# Patient Record
Sex: Female | Born: 1935 | Race: Black or African American | Hispanic: No | Smoking: Former smoker
Health system: Southern US, Community
[De-identification: ages and names within clinical notes are randomized; demographics above are authoritative.]

## PROBLEM LIST (undated history)

## (undated) DIAGNOSIS — M1990 Unspecified osteoarthritis, unspecified site: Secondary | ICD-10-CM

## (undated) DIAGNOSIS — Z86711 Personal history of pulmonary embolism: Secondary | ICD-10-CM

## (undated) DIAGNOSIS — Z8679 Personal history of other diseases of the circulatory system: Secondary | ICD-10-CM

## (undated) DIAGNOSIS — E119 Type 2 diabetes mellitus without complications: Secondary | ICD-10-CM

## (undated) DIAGNOSIS — I739 Peripheral vascular disease, unspecified: Secondary | ICD-10-CM

## (undated) DIAGNOSIS — E669 Obesity, unspecified: Secondary | ICD-10-CM

## (undated) DIAGNOSIS — Z7901 Long term (current) use of anticoagulants: Secondary | ICD-10-CM

## (undated) DIAGNOSIS — I10 Essential (primary) hypertension: Secondary | ICD-10-CM

## (undated) DIAGNOSIS — R5381 Other malaise: Secondary | ICD-10-CM

## (undated) DIAGNOSIS — E039 Hypothyroidism, unspecified: Secondary | ICD-10-CM

## (undated) DIAGNOSIS — N3000 Acute cystitis without hematuria: Secondary | ICD-10-CM

## (undated) DIAGNOSIS — R5383 Other fatigue: Secondary | ICD-10-CM

## (undated) DIAGNOSIS — B369 Superficial mycosis, unspecified: Secondary | ICD-10-CM

## (undated) DIAGNOSIS — K219 Gastro-esophageal reflux disease without esophagitis: Secondary | ICD-10-CM

## (undated) DIAGNOSIS — E785 Hyperlipidemia, unspecified: Secondary | ICD-10-CM

## (undated) DIAGNOSIS — F0390 Unspecified dementia without behavioral disturbance: Secondary | ICD-10-CM

## (undated) HISTORY — DX: Long term (current) use of anticoagulants: Z79.01

## (undated) HISTORY — DX: Peripheral vascular disease, unspecified: I73.9

## (undated) HISTORY — DX: Unspecified osteoarthritis, unspecified site: M19.90

## (undated) HISTORY — DX: Hypothyroidism, unspecified: E03.9

## (undated) HISTORY — DX: Unspecified dementia, unspecified severity, without behavioral disturbance, psychotic disturbance, mood disturbance, and anxiety: F03.90

## (undated) HISTORY — DX: Personal history of pulmonary embolism: Z86.711

## (undated) HISTORY — DX: Other fatigue: R53.83

## (undated) HISTORY — DX: Other fatigue: R53.81

## (undated) HISTORY — DX: Essential (primary) hypertension: I10

## (undated) HISTORY — DX: Acute cystitis without hematuria: N30.00

## (undated) HISTORY — DX: Obesity, unspecified: E66.9

## (undated) HISTORY — DX: Superficial mycosis, unspecified: B36.9

## (undated) HISTORY — DX: Type 2 diabetes mellitus without complications: E11.9

## (undated) HISTORY — DX: Hyperlipidemia, unspecified: E78.5

## (undated) HISTORY — DX: Gastro-esophageal reflux disease without esophagitis: K21.9

## (undated) HISTORY — DX: Personal history of other diseases of the circulatory system: Z86.79

---

## 1960-03-28 HISTORY — PX: THYROIDECTOMY, PARTIAL: SHX18

## 2000-12-26 ENCOUNTER — Encounter: Payer: Self-pay | Admitting: *Deleted

## 2000-12-26 ENCOUNTER — Emergency Department (HOSPITAL_COMMUNITY): Admission: EM | Admit: 2000-12-26 | Discharge: 2000-12-26 | Payer: Self-pay | Admitting: *Deleted

## 2001-03-28 HISTORY — PX: MASTECTOMY: SHX3

## 2001-04-24 ENCOUNTER — Encounter: Payer: Self-pay | Admitting: *Deleted

## 2001-04-24 ENCOUNTER — Emergency Department (HOSPITAL_COMMUNITY): Admission: EM | Admit: 2001-04-24 | Discharge: 2001-04-24 | Payer: Self-pay | Admitting: *Deleted

## 2001-05-11 ENCOUNTER — Ambulatory Visit (HOSPITAL_COMMUNITY): Admission: RE | Admit: 2001-05-11 | Discharge: 2001-05-11 | Payer: Self-pay | Admitting: Neurology

## 2001-05-11 ENCOUNTER — Encounter: Payer: Self-pay | Admitting: Neurology

## 2001-09-27 ENCOUNTER — Encounter: Payer: Self-pay | Admitting: Family Medicine

## 2001-09-27 ENCOUNTER — Ambulatory Visit (HOSPITAL_COMMUNITY): Admission: RE | Admit: 2001-09-27 | Discharge: 2001-09-27 | Payer: Self-pay | Admitting: Family Medicine

## 2001-11-14 ENCOUNTER — Encounter: Payer: Self-pay | Admitting: Emergency Medicine

## 2001-11-15 ENCOUNTER — Inpatient Hospital Stay (HOSPITAL_COMMUNITY): Admission: EM | Admit: 2001-11-15 | Discharge: 2001-11-16 | Payer: Self-pay | Admitting: Emergency Medicine

## 2001-11-20 ENCOUNTER — Ambulatory Visit (HOSPITAL_COMMUNITY): Admission: RE | Admit: 2001-11-20 | Discharge: 2001-11-20 | Payer: Self-pay | Admitting: Family Medicine

## 2001-11-20 ENCOUNTER — Encounter: Payer: Self-pay | Admitting: Family Medicine

## 2001-11-22 ENCOUNTER — Encounter: Payer: Self-pay | Admitting: Family Medicine

## 2001-12-11 ENCOUNTER — Ambulatory Visit (HOSPITAL_COMMUNITY): Admission: RE | Admit: 2001-12-11 | Discharge: 2001-12-11 | Payer: Self-pay | Admitting: General Surgery

## 2001-12-11 ENCOUNTER — Encounter: Payer: Self-pay | Admitting: General Surgery

## 2001-12-14 ENCOUNTER — Inpatient Hospital Stay (HOSPITAL_COMMUNITY): Admission: RE | Admit: 2001-12-14 | Discharge: 2001-12-17 | Payer: Self-pay | Admitting: General Surgery

## 2001-12-22 ENCOUNTER — Emergency Department (HOSPITAL_COMMUNITY): Admission: EM | Admit: 2001-12-22 | Discharge: 2001-12-22 | Payer: Self-pay | Admitting: Emergency Medicine

## 2001-12-22 ENCOUNTER — Encounter: Payer: Self-pay | Admitting: Emergency Medicine

## 2002-02-05 ENCOUNTER — Encounter (HOSPITAL_COMMUNITY): Admission: RE | Admit: 2002-02-05 | Discharge: 2002-03-07 | Payer: Self-pay | Admitting: Oncology

## 2002-02-05 ENCOUNTER — Encounter: Admission: RE | Admit: 2002-02-05 | Discharge: 2002-02-05 | Payer: Self-pay | Admitting: Oncology

## 2002-10-15 ENCOUNTER — Encounter: Payer: Self-pay | Admitting: General Surgery

## 2002-10-15 ENCOUNTER — Ambulatory Visit (HOSPITAL_COMMUNITY): Admission: RE | Admit: 2002-10-15 | Discharge: 2002-10-15 | Payer: Self-pay | Admitting: General Surgery

## 2002-12-09 ENCOUNTER — Encounter: Payer: Self-pay | Admitting: Emergency Medicine

## 2002-12-09 ENCOUNTER — Emergency Department (HOSPITAL_COMMUNITY): Admission: EM | Admit: 2002-12-09 | Discharge: 2002-12-09 | Payer: Self-pay | Admitting: Emergency Medicine

## 2002-12-16 ENCOUNTER — Ambulatory Visit (HOSPITAL_COMMUNITY): Admission: RE | Admit: 2002-12-16 | Discharge: 2002-12-16 | Payer: Self-pay | Admitting: Diagnostic Radiology

## 2003-01-27 HISTORY — PX: ATRIAL ABLATION SURGERY: SHX560

## 2003-02-03 ENCOUNTER — Ambulatory Visit (HOSPITAL_COMMUNITY): Admission: RE | Admit: 2003-02-03 | Discharge: 2003-02-04 | Payer: Self-pay | Admitting: Internal Medicine

## 2003-02-17 ENCOUNTER — Encounter: Admission: RE | Admit: 2003-02-17 | Discharge: 2003-02-17 | Payer: Self-pay | Admitting: Oncology

## 2003-02-17 ENCOUNTER — Encounter (HOSPITAL_COMMUNITY): Admission: RE | Admit: 2003-02-17 | Discharge: 2003-03-19 | Payer: Self-pay | Admitting: Oncology

## 2003-03-03 ENCOUNTER — Encounter: Payer: Self-pay | Admitting: Cardiology

## 2003-05-01 ENCOUNTER — Ambulatory Visit (HOSPITAL_COMMUNITY): Admission: RE | Admit: 2003-05-01 | Discharge: 2003-05-01 | Payer: Self-pay | Admitting: General Surgery

## 2003-05-28 ENCOUNTER — Ambulatory Visit (HOSPITAL_COMMUNITY): Admission: RE | Admit: 2003-05-28 | Discharge: 2003-05-28 | Payer: Self-pay | Admitting: Family Medicine

## 2004-05-31 ENCOUNTER — Ambulatory Visit: Payer: Self-pay | Admitting: Family Medicine

## 2004-06-02 ENCOUNTER — Ambulatory Visit (HOSPITAL_COMMUNITY): Admission: RE | Admit: 2004-06-02 | Discharge: 2004-06-02 | Payer: Self-pay | Admitting: Family Medicine

## 2004-10-06 ENCOUNTER — Ambulatory Visit: Payer: Self-pay | Admitting: Family Medicine

## 2004-11-02 ENCOUNTER — Encounter: Admission: RE | Admit: 2004-11-02 | Discharge: 2004-11-02 | Payer: Self-pay | Admitting: Oncology

## 2004-11-02 ENCOUNTER — Ambulatory Visit (HOSPITAL_COMMUNITY): Payer: Self-pay | Admitting: Oncology

## 2005-02-07 ENCOUNTER — Ambulatory Visit: Payer: Self-pay | Admitting: Family Medicine

## 2005-02-08 ENCOUNTER — Inpatient Hospital Stay (HOSPITAL_COMMUNITY): Admission: EM | Admit: 2005-02-08 | Discharge: 2005-02-14 | Payer: Self-pay | Admitting: Emergency Medicine

## 2005-02-22 ENCOUNTER — Ambulatory Visit: Payer: Self-pay | Admitting: Family Medicine

## 2005-03-10 ENCOUNTER — Ambulatory Visit: Payer: Self-pay | Admitting: Family Medicine

## 2005-03-10 ENCOUNTER — Ambulatory Visit (HOSPITAL_COMMUNITY): Admission: RE | Admit: 2005-03-10 | Discharge: 2005-03-10 | Payer: Self-pay | Admitting: Family Medicine

## 2005-04-25 ENCOUNTER — Ambulatory Visit: Payer: Self-pay | Admitting: Family Medicine

## 2005-05-23 ENCOUNTER — Ambulatory Visit: Payer: Self-pay | Admitting: Family Medicine

## 2005-06-09 ENCOUNTER — Ambulatory Visit (HOSPITAL_COMMUNITY): Admission: RE | Admit: 2005-06-09 | Discharge: 2005-06-09 | Payer: Self-pay | Admitting: Family Medicine

## 2005-07-22 ENCOUNTER — Ambulatory Visit: Payer: Self-pay | Admitting: Cardiology

## 2005-08-19 ENCOUNTER — Ambulatory Visit: Payer: Self-pay | Admitting: *Deleted

## 2005-09-22 ENCOUNTER — Ambulatory Visit: Payer: Self-pay | Admitting: Family Medicine

## 2005-10-13 ENCOUNTER — Ambulatory Visit: Payer: Self-pay | Admitting: Family Medicine

## 2005-11-16 ENCOUNTER — Ambulatory Visit: Payer: Self-pay | Admitting: Cardiology

## 2005-11-21 ENCOUNTER — Ambulatory Visit (HOSPITAL_COMMUNITY): Payer: Self-pay | Admitting: Oncology

## 2005-11-21 ENCOUNTER — Encounter: Admission: RE | Admit: 2005-11-21 | Discharge: 2005-11-21 | Payer: Self-pay | Admitting: Oncology

## 2005-11-21 ENCOUNTER — Encounter (HOSPITAL_COMMUNITY): Admission: RE | Admit: 2005-11-21 | Discharge: 2005-12-21 | Payer: Self-pay | Admitting: Oncology

## 2005-12-29 ENCOUNTER — Ambulatory Visit: Payer: Self-pay | Admitting: Cardiology

## 2006-02-23 ENCOUNTER — Ambulatory Visit: Payer: Self-pay | Admitting: Cardiology

## 2006-03-22 ENCOUNTER — Ambulatory Visit: Payer: Self-pay | Admitting: Cardiology

## 2006-04-10 ENCOUNTER — Ambulatory Visit: Payer: Self-pay | Admitting: Family Medicine

## 2006-05-16 ENCOUNTER — Ambulatory Visit: Payer: Self-pay | Admitting: Cardiology

## 2006-05-23 ENCOUNTER — Ambulatory Visit: Payer: Self-pay | Admitting: Cardiovascular Disease

## 2006-06-22 ENCOUNTER — Encounter (INDEPENDENT_AMBULATORY_CARE_PROVIDER_SITE_OTHER): Payer: Self-pay | Admitting: Specialist

## 2006-06-22 ENCOUNTER — Ambulatory Visit: Payer: Self-pay | Admitting: Family Medicine

## 2006-06-22 ENCOUNTER — Other Ambulatory Visit: Admission: RE | Admit: 2006-06-22 | Discharge: 2006-06-22 | Payer: Self-pay | Admitting: Family Medicine

## 2006-10-17 ENCOUNTER — Ambulatory Visit: Payer: Self-pay | Admitting: Family Medicine

## 2006-10-23 ENCOUNTER — Emergency Department (HOSPITAL_COMMUNITY): Admission: EM | Admit: 2006-10-23 | Discharge: 2006-10-23 | Payer: Self-pay | Admitting: Emergency Medicine

## 2006-10-26 ENCOUNTER — Ambulatory Visit: Payer: Self-pay | Admitting: Family Medicine

## 2006-11-01 ENCOUNTER — Encounter: Payer: Self-pay | Admitting: Family Medicine

## 2006-11-01 LAB — CONVERTED CEMR LAB
ALT: 12 units/L (ref 0–35)
AST: 21 units/L (ref 0–37)
Albumin: 4.3 g/dL (ref 3.5–5.2)
Cholesterol: 192 mg/dL (ref 0–200)
HDL: 54 mg/dL (ref >39)
TSH: 1.806 microintl units/mL (ref 0.350–5.50)
Total CHOL/HDL Ratio: 3.6

## 2007-02-11 ENCOUNTER — Inpatient Hospital Stay (HOSPITAL_COMMUNITY): Admission: EM | Admit: 2007-02-11 | Discharge: 2007-02-13 | Payer: Self-pay | Admitting: Emergency Medicine

## 2007-02-14 ENCOUNTER — Ambulatory Visit: Payer: Self-pay | Admitting: Family Medicine

## 2007-02-21 ENCOUNTER — Ambulatory Visit: Payer: Self-pay | Admitting: Family Medicine

## 2007-03-14 ENCOUNTER — Ambulatory Visit: Payer: Self-pay | Admitting: Family Medicine

## 2007-03-15 ENCOUNTER — Encounter: Payer: Self-pay | Admitting: Family Medicine

## 2007-04-17 ENCOUNTER — Ambulatory Visit: Payer: Self-pay | Admitting: Family Medicine

## 2007-04-19 ENCOUNTER — Encounter: Payer: Self-pay | Admitting: Family Medicine

## 2007-04-24 ENCOUNTER — Ambulatory Visit (HOSPITAL_COMMUNITY): Admission: RE | Admit: 2007-04-24 | Discharge: 2007-04-24 | Payer: Self-pay | Admitting: Family Medicine

## 2007-05-04 ENCOUNTER — Encounter: Payer: Self-pay | Admitting: Family Medicine

## 2007-05-08 ENCOUNTER — Ambulatory Visit (HOSPITAL_COMMUNITY): Admission: RE | Admit: 2007-05-08 | Discharge: 2007-05-08 | Payer: Self-pay | Admitting: Family Medicine

## 2007-05-17 ENCOUNTER — Encounter (INDEPENDENT_AMBULATORY_CARE_PROVIDER_SITE_OTHER): Payer: Self-pay | Admitting: Obstetrics and Gynecology

## 2007-05-17 ENCOUNTER — Ambulatory Visit (HOSPITAL_COMMUNITY): Admission: RE | Admit: 2007-05-17 | Discharge: 2007-05-17 | Payer: Self-pay | Admitting: Obstetrics and Gynecology

## 2007-05-24 ENCOUNTER — Ambulatory Visit: Payer: Self-pay | Admitting: Family Medicine

## 2007-05-26 ENCOUNTER — Encounter: Payer: Self-pay | Admitting: Family Medicine

## 2007-09-21 ENCOUNTER — Encounter: Payer: Self-pay | Admitting: Family Medicine

## 2007-09-21 DIAGNOSIS — E785 Hyperlipidemia, unspecified: Secondary | ICD-10-CM

## 2007-09-21 DIAGNOSIS — M479 Spondylosis, unspecified: Secondary | ICD-10-CM | POA: Insufficient documentation

## 2007-09-21 DIAGNOSIS — E669 Obesity, unspecified: Secondary | ICD-10-CM

## 2007-09-21 DIAGNOSIS — I10 Essential (primary) hypertension: Secondary | ICD-10-CM

## 2007-09-21 DIAGNOSIS — K219 Gastro-esophageal reflux disease without esophagitis: Secondary | ICD-10-CM

## 2007-09-21 DIAGNOSIS — E039 Hypothyroidism, unspecified: Secondary | ICD-10-CM

## 2007-09-21 DIAGNOSIS — Z8679 Personal history of other diseases of the circulatory system: Secondary | ICD-10-CM

## 2007-09-21 DIAGNOSIS — Z86718 Personal history of other venous thrombosis and embolism: Secondary | ICD-10-CM

## 2007-09-21 DIAGNOSIS — F0390 Unspecified dementia without behavioral disturbance: Secondary | ICD-10-CM

## 2007-11-07 ENCOUNTER — Encounter: Payer: Self-pay | Admitting: Family Medicine

## 2007-11-21 ENCOUNTER — Ambulatory Visit: Payer: Self-pay | Admitting: Family Medicine

## 2007-11-21 DIAGNOSIS — R5381 Other malaise: Secondary | ICD-10-CM

## 2007-11-21 DIAGNOSIS — R5383 Other fatigue: Secondary | ICD-10-CM

## 2007-11-21 LAB — CONVERTED CEMR LAB
Nitrite: NEGATIVE
Protein, U semiquant: NEGATIVE
Specific Gravity, Urine: 1.025
Urobilinogen, UA: 0.2

## 2007-11-22 ENCOUNTER — Telehealth: Payer: Self-pay | Admitting: Family Medicine

## 2007-11-22 ENCOUNTER — Encounter: Payer: Self-pay | Admitting: Family Medicine

## 2007-11-24 DIAGNOSIS — N3000 Acute cystitis without hematuria: Secondary | ICD-10-CM

## 2007-11-24 DIAGNOSIS — B369 Superficial mycosis, unspecified: Secondary | ICD-10-CM | POA: Insufficient documentation

## 2007-11-25 ENCOUNTER — Encounter: Payer: Self-pay | Admitting: Family Medicine

## 2007-11-27 ENCOUNTER — Encounter: Payer: Self-pay | Admitting: Family Medicine

## 2007-11-27 LAB — CONVERTED CEMR LAB
ALT: 12 units/L (ref 0–35)
AST: 19 units/L (ref 0–37)
Alkaline Phosphatase: 73 units/L (ref 39–117)
Basophils Absolute: 0 10*3/uL (ref 0.0–0.1)
Bilirubin, Direct: 0.1 mg/dL (ref 0.0–0.3)
Calcium: 9.3 mg/dL (ref 8.4–10.5)
Cholesterol: 183 mg/dL (ref 0–200)
Eosinophils Absolute: 0.1 10*3/uL (ref 0.0–0.7)
Eosinophils Relative: 3 % (ref 0–5)
Glucose, Bld: 139 mg/dL — ABNORMAL HIGH (ref 70–99)
HCT: 43.8 % (ref 36.0–46.0)
Indirect Bilirubin: 0.4 mg/dL (ref 0.0–0.9)
Lymphs Abs: 1.7 10*3/uL (ref 0.7–4.0)
MCV: 80.4 fL (ref 78.0–100.0)
Platelets: 227 10*3/uL (ref 150–400)
RDW: 13.9 % (ref 11.5–15.5)
Sodium: 139 meq/L (ref 135–145)
TSH: 2.108 microintl units/mL (ref 0.350–4.50)
Total CHOL/HDL Ratio: 4

## 2007-12-11 ENCOUNTER — Ambulatory Visit: Payer: Self-pay | Admitting: Family Medicine

## 2007-12-11 DIAGNOSIS — E119 Type 2 diabetes mellitus without complications: Secondary | ICD-10-CM | POA: Insufficient documentation

## 2008-01-11 ENCOUNTER — Encounter: Payer: Self-pay | Admitting: Family Medicine

## 2008-01-20 ENCOUNTER — Observation Stay (HOSPITAL_COMMUNITY): Admission: EM | Admit: 2008-01-20 | Discharge: 2008-01-22 | Payer: Self-pay | Admitting: Emergency Medicine

## 2008-01-20 ENCOUNTER — Ambulatory Visit: Payer: Self-pay | Admitting: Cardiology

## 2008-01-21 ENCOUNTER — Encounter (INDEPENDENT_AMBULATORY_CARE_PROVIDER_SITE_OTHER): Payer: Self-pay | Admitting: Internal Medicine

## 2008-01-21 ENCOUNTER — Encounter: Payer: Self-pay | Admitting: Family Medicine

## 2008-01-31 ENCOUNTER — Ambulatory Visit: Payer: Self-pay | Admitting: Family Medicine

## 2008-02-07 ENCOUNTER — Ambulatory Visit: Payer: Self-pay | Admitting: Cardiology

## 2008-03-13 ENCOUNTER — Ambulatory Visit: Payer: Self-pay | Admitting: Family Medicine

## 2008-03-13 LAB — CONVERTED CEMR LAB
Glucose, Bld: 183 mg/dL
Hgb A1c MFr Bld: 6.2 %

## 2008-03-14 ENCOUNTER — Encounter: Payer: Self-pay | Admitting: Family Medicine

## 2008-03-14 LAB — CONVERTED CEMR LAB
Creatinine, Urine: 411.1 mg/dL
Microalb Creat Ratio: 15.9 mg/g (ref 0.0–30.0)
Microalb, Ur: 6.55 mg/dL — ABNORMAL HIGH (ref 0.00–1.89)

## 2008-05-09 ENCOUNTER — Ambulatory Visit (HOSPITAL_COMMUNITY): Admission: RE | Admit: 2008-05-09 | Discharge: 2008-05-09 | Payer: Self-pay | Admitting: Family Medicine

## 2008-05-13 ENCOUNTER — Telehealth: Payer: Self-pay | Admitting: Family Medicine

## 2008-06-19 ENCOUNTER — Ambulatory Visit: Payer: Self-pay | Admitting: Family Medicine

## 2008-06-19 LAB — CONVERTED CEMR LAB
Nitrite: POSITIVE
Protein, U semiquant: 30
Urobilinogen, UA: 0.2

## 2008-06-20 ENCOUNTER — Encounter: Payer: Self-pay | Admitting: Family Medicine

## 2008-09-08 DIAGNOSIS — I739 Peripheral vascular disease, unspecified: Secondary | ICD-10-CM

## 2008-10-02 ENCOUNTER — Ambulatory Visit: Payer: Self-pay | Admitting: Family Medicine

## 2008-10-02 DIAGNOSIS — J209 Acute bronchitis, unspecified: Secondary | ICD-10-CM | POA: Insufficient documentation

## 2008-10-02 LAB — CONVERTED CEMR LAB
Basophils Relative: 1 % (ref 0–1)
Bilirubin Urine: NEGATIVE
Blood in Urine, dipstick: NEGATIVE
CO2: 25 meq/L (ref 19–32)
Calcium: 10 mg/dL (ref 8.4–10.5)
Chloride: 100 meq/L (ref 96–112)
Eosinophils Absolute: 0.1 10*3/uL (ref 0.0–0.7)
Glucose, Bld: 108 mg/dL — ABNORMAL HIGH (ref 70–99)
Hemoglobin: 13.4 g/dL (ref 12.0–15.0)
Ketones, urine, test strip: NEGATIVE
Lymphs Abs: 2.8 10*3/uL (ref 0.7–4.0)
MCHC: 31.9 g/dL (ref 30.0–36.0)
MCV: 78.5 fL (ref 78.0–100.0)
Monocytes Absolute: 0.5 10*3/uL (ref 0.1–1.0)
Monocytes Relative: 11 % (ref 3–12)
Neutro Abs: 1.2 10*3/uL — ABNORMAL LOW (ref 1.7–7.7)
Nitrite: NEGATIVE
RBC: 5.35 M/uL — ABNORMAL HIGH (ref 3.87–5.11)
Sodium: 141 meq/L (ref 135–145)
Urobilinogen, UA: 0.2
WBC: 4.5 10*3/uL (ref 4.0–10.5)

## 2008-10-13 ENCOUNTER — Telehealth: Payer: Self-pay | Admitting: Family Medicine

## 2008-10-13 ENCOUNTER — Emergency Department (HOSPITAL_COMMUNITY): Admission: EM | Admit: 2008-10-13 | Discharge: 2008-10-13 | Payer: Self-pay | Admitting: Emergency Medicine

## 2008-10-15 ENCOUNTER — Telehealth: Payer: Self-pay | Admitting: Family Medicine

## 2008-10-17 ENCOUNTER — Inpatient Hospital Stay (HOSPITAL_COMMUNITY): Admission: AD | Admit: 2008-10-17 | Discharge: 2008-10-20 | Payer: Self-pay

## 2008-10-17 ENCOUNTER — Ambulatory Visit: Payer: Self-pay | Admitting: Family Medicine

## 2008-10-17 DIAGNOSIS — R4182 Altered mental status, unspecified: Secondary | ICD-10-CM

## 2008-10-17 LAB — CONVERTED CEMR LAB
Bilirubin Urine: NEGATIVE
Blood in Urine, dipstick: NEGATIVE
Ketones, urine, test strip: NEGATIVE
Nitrite: NEGATIVE
Urobilinogen, UA: 2

## 2008-10-18 ENCOUNTER — Encounter: Payer: Self-pay | Admitting: Family Medicine

## 2008-10-22 ENCOUNTER — Encounter: Payer: Self-pay | Admitting: Family Medicine

## 2008-10-23 ENCOUNTER — Ambulatory Visit: Payer: Self-pay | Admitting: Cardiology

## 2008-10-23 ENCOUNTER — Inpatient Hospital Stay (HOSPITAL_COMMUNITY): Admission: EM | Admit: 2008-10-23 | Discharge: 2008-10-28 | Payer: Self-pay | Admitting: Emergency Medicine

## 2008-10-27 ENCOUNTER — Telehealth: Payer: Self-pay | Admitting: Family Medicine

## 2008-10-27 ENCOUNTER — Encounter: Payer: Self-pay | Admitting: Cardiology

## 2008-10-27 ENCOUNTER — Encounter (INDEPENDENT_AMBULATORY_CARE_PROVIDER_SITE_OTHER): Payer: Self-pay | Admitting: Family Medicine

## 2008-10-28 ENCOUNTER — Inpatient Hospital Stay
Admission: AD | Admit: 2008-10-28 | Discharge: 2011-03-26 | Disposition: A | Discharge status: Nursing Home (Includes ICF) | Payer: Medicare Other | Source: Home / Self Care | Attending: Internal Medicine | Admitting: Internal Medicine

## 2008-10-28 DIAGNOSIS — R05 Cough: Secondary | ICD-10-CM

## 2008-10-28 DIAGNOSIS — R4189 Other symptoms and signs involving cognitive functions and awareness: Secondary | ICD-10-CM

## 2008-10-31 ENCOUNTER — Ambulatory Visit: Payer: Self-pay | Admitting: Family Medicine

## 2008-11-10 ENCOUNTER — Encounter: Payer: Self-pay | Admitting: *Deleted

## 2008-11-18 ENCOUNTER — Telehealth: Payer: Self-pay | Admitting: Family Medicine

## 2008-12-17 ENCOUNTER — Encounter: Payer: Self-pay | Admitting: Cardiology

## 2009-04-23 ENCOUNTER — Ambulatory Visit (HOSPITAL_COMMUNITY)
Admission: RE | Admit: 2009-04-23 | Discharge: 2009-04-23 | Payer: Self-pay | Source: Home / Self Care | Admitting: Internal Medicine

## 2009-08-13 ENCOUNTER — Ambulatory Visit (HOSPITAL_COMMUNITY): Admission: RE | Admit: 2009-08-13 | Discharge: 2009-08-13 | Payer: Self-pay | Admitting: Internal Medicine

## 2009-09-10 ENCOUNTER — Ambulatory Visit (HOSPITAL_COMMUNITY): Admission: RE | Admit: 2009-09-10 | Discharge: 2009-09-10 | Payer: Self-pay | Admitting: Internal Medicine

## 2009-10-08 ENCOUNTER — Ambulatory Visit (HOSPITAL_COMMUNITY): Admission: RE | Admit: 2009-10-08 | Discharge: 2009-10-08 | Payer: Self-pay | Admitting: Internal Medicine

## 2009-10-29 ENCOUNTER — Ambulatory Visit (HOSPITAL_COMMUNITY): Admission: RE | Admit: 2009-10-29 | Discharge: 2009-10-29 | Payer: Self-pay | Admitting: Internal Medicine

## 2010-04-02 LAB — GLUCOSE, CAPILLARY: Glucose-Capillary: 125 mg/dL — ABNORMAL HIGH (ref 70–99)

## 2010-04-12 LAB — GLUCOSE, CAPILLARY
Glucose-Capillary: 114 mg/dL — ABNORMAL HIGH (ref 70–99)
Glucose-Capillary: 130 mg/dL — ABNORMAL HIGH (ref 70–99)
Glucose-Capillary: 149 mg/dL — ABNORMAL HIGH (ref 70–99)
Glucose-Capillary: 150 mg/dL — ABNORMAL HIGH (ref 70–99)
Glucose-Capillary: 147 mg/dL — ABNORMAL HIGH (ref 70–99)
Glucose-Capillary: 122 mg/dL — ABNORMAL HIGH (ref 70–99)

## 2010-04-14 LAB — GLUCOSE, CAPILLARY: Glucose-Capillary: 190 mg/dL — ABNORMAL HIGH (ref 70–99)

## 2010-04-17 ENCOUNTER — Encounter: Payer: Self-pay | Admitting: Family Medicine

## 2010-04-18 ENCOUNTER — Encounter: Payer: Self-pay | Admitting: Family Medicine

## 2010-04-19 LAB — GLUCOSE, CAPILLARY: Glucose-Capillary: 188 mg/dL — ABNORMAL HIGH (ref 70–99)

## 2010-04-23 LAB — GLUCOSE, CAPILLARY

## 2010-04-25 LAB — GLUCOSE, CAPILLARY: Glucose-Capillary: 131 mg/dL — ABNORMAL HIGH (ref 70–99)

## 2010-04-30 LAB — GLUCOSE, CAPILLARY: Glucose-Capillary: 123 mg/dL — ABNORMAL HIGH (ref 70–99)

## 2010-05-03 LAB — GLUCOSE, CAPILLARY
Glucose-Capillary: 201 mg/dL — ABNORMAL HIGH (ref 70–99)
Glucose-Capillary: 164 mg/dL — ABNORMAL HIGH (ref 70–99)

## 2010-05-07 LAB — GLUCOSE, CAPILLARY: Glucose-Capillary: 136 mg/dL — ABNORMAL HIGH (ref 70–99)

## 2010-05-14 LAB — GLUCOSE, CAPILLARY: Glucose-Capillary: 119 mg/dL — ABNORMAL HIGH (ref 70–99)

## 2010-05-21 LAB — GLUCOSE, CAPILLARY: Glucose-Capillary: 180 mg/dL — ABNORMAL HIGH (ref 70–99)

## 2010-05-24 LAB — GLUCOSE, CAPILLARY: Glucose-Capillary: 133 mg/dL — ABNORMAL HIGH (ref 70–99)

## 2010-05-28 LAB — GLUCOSE, CAPILLARY: Glucose-Capillary: 126 mg/dL — ABNORMAL HIGH (ref 70–99)

## 2010-06-07 LAB — GLUCOSE, CAPILLARY
Glucose-Capillary: 124 mg/dL — ABNORMAL HIGH (ref 70–99)
Glucose-Capillary: 129 mg/dL — ABNORMAL HIGH (ref 70–99)
Glucose-Capillary: 124 mg/dL — ABNORMAL HIGH (ref 70–99)
Glucose-Capillary: 125 mg/dL — ABNORMAL HIGH (ref 70–99)
Glucose-Capillary: 126 mg/dL — ABNORMAL HIGH (ref 70–99)
Glucose-Capillary: 133 mg/dL — ABNORMAL HIGH (ref 70–99)
Glucose-Capillary: 140 mg/dL — ABNORMAL HIGH (ref 70–99)
Glucose-Capillary: 166 mg/dL — ABNORMAL HIGH (ref 70–99)
Glucose-Capillary: 123 mg/dL — ABNORMAL HIGH (ref 70–99)
Glucose-Capillary: 122 mg/dL — ABNORMAL HIGH (ref 70–99)
Glucose-Capillary: 130 mg/dL — ABNORMAL HIGH (ref 70–99)
Glucose-Capillary: 139 mg/dL — ABNORMAL HIGH (ref 70–99)
Glucose-Capillary: 153 mg/dL — ABNORMAL HIGH (ref 70–99)
Glucose-Capillary: 175 mg/dL — ABNORMAL HIGH (ref 70–99)

## 2010-06-08 LAB — GLUCOSE, CAPILLARY
Glucose-Capillary: 105 mg/dL — ABNORMAL HIGH (ref 70–99)
Glucose-Capillary: 121 mg/dL — ABNORMAL HIGH (ref 70–99)
Glucose-Capillary: 122 mg/dL — ABNORMAL HIGH (ref 70–99)
Glucose-Capillary: 129 mg/dL — ABNORMAL HIGH (ref 70–99)
Glucose-Capillary: 129 mg/dL — ABNORMAL HIGH (ref 70–99)
Glucose-Capillary: 129 mg/dL — ABNORMAL HIGH (ref 70–99)
Glucose-Capillary: 130 mg/dL — ABNORMAL HIGH (ref 70–99)
Glucose-Capillary: 131 mg/dL — ABNORMAL HIGH (ref 70–99)
Glucose-Capillary: 135 mg/dL — ABNORMAL HIGH (ref 70–99)
Glucose-Capillary: 139 mg/dL — ABNORMAL HIGH (ref 70–99)
Glucose-Capillary: 141 mg/dL — ABNORMAL HIGH (ref 70–99)
Glucose-Capillary: 172 mg/dL — ABNORMAL HIGH (ref 70–99)
Glucose-Capillary: 209 mg/dL — ABNORMAL HIGH (ref 70–99)

## 2010-06-09 LAB — GLUCOSE, CAPILLARY
Glucose-Capillary: 104 mg/dL — ABNORMAL HIGH (ref 70–99)
Glucose-Capillary: 118 mg/dL — ABNORMAL HIGH (ref 70–99)
Glucose-Capillary: 121 mg/dL — ABNORMAL HIGH (ref 70–99)
Glucose-Capillary: 121 mg/dL — ABNORMAL HIGH (ref 70–99)
Glucose-Capillary: 143 mg/dL — ABNORMAL HIGH (ref 70–99)
Glucose-Capillary: 102 mg/dL — ABNORMAL HIGH (ref 70–99)
Glucose-Capillary: 123 mg/dL — ABNORMAL HIGH (ref 70–99)
Glucose-Capillary: 126 mg/dL — ABNORMAL HIGH (ref 70–99)
Glucose-Capillary: 56 mg/dL — ABNORMAL LOW (ref 70–99)
Glucose-Capillary: 97 mg/dL (ref 70–99)

## 2010-06-10 LAB — GLUCOSE, CAPILLARY
Glucose-Capillary: 116 mg/dL — ABNORMAL HIGH (ref 70–99)
Glucose-Capillary: 118 mg/dL — ABNORMAL HIGH (ref 70–99)
Glucose-Capillary: 119 mg/dL — ABNORMAL HIGH (ref 70–99)
Glucose-Capillary: 127 mg/dL — ABNORMAL HIGH (ref 70–99)
Glucose-Capillary: 128 mg/dL — ABNORMAL HIGH (ref 70–99)
Glucose-Capillary: 150 mg/dL — ABNORMAL HIGH (ref 70–99)
Glucose-Capillary: 276 mg/dL — ABNORMAL HIGH (ref 70–99)
Glucose-Capillary: 31 mg/dL — CL (ref 70–99)
Glucose-Capillary: 36 mg/dL — CL (ref 70–99)
Glucose-Capillary: 108 mg/dL — ABNORMAL HIGH (ref 70–99)
Glucose-Capillary: 130 mg/dL — ABNORMAL HIGH (ref 70–99)
Glucose-Capillary: 153 mg/dL — ABNORMAL HIGH (ref 70–99)
Glucose-Capillary: 117 mg/dL — ABNORMAL HIGH (ref 70–99)
Glucose-Capillary: 117 mg/dL — ABNORMAL HIGH (ref 70–99)
Glucose-Capillary: 125 mg/dL — ABNORMAL HIGH (ref 70–99)
Glucose-Capillary: 128 mg/dL — ABNORMAL HIGH (ref 70–99)
Glucose-Capillary: 145 mg/dL — ABNORMAL HIGH (ref 70–99)
Glucose-Capillary: 168 mg/dL — ABNORMAL HIGH (ref 70–99)

## 2010-06-11 LAB — GLUCOSE, CAPILLARY
Glucose-Capillary: 122 mg/dL — ABNORMAL HIGH (ref 70–99)
Glucose-Capillary: 115 mg/dL — ABNORMAL HIGH (ref 70–99)
Glucose-Capillary: 118 mg/dL — ABNORMAL HIGH (ref 70–99)
Glucose-Capillary: 126 mg/dL — ABNORMAL HIGH (ref 70–99)
Glucose-Capillary: 142 mg/dL — ABNORMAL HIGH (ref 70–99)
Glucose-Capillary: 146 mg/dL — ABNORMAL HIGH (ref 70–99)

## 2010-06-12 LAB — GLUCOSE, CAPILLARY
Glucose-Capillary: 124 mg/dL — ABNORMAL HIGH (ref 70–99)
Glucose-Capillary: 158 mg/dL — ABNORMAL HIGH (ref 70–99)

## 2010-06-13 LAB — GLUCOSE, CAPILLARY
Glucose-Capillary: 125 mg/dL — ABNORMAL HIGH (ref 70–99)
Glucose-Capillary: 145 mg/dL — ABNORMAL HIGH (ref 70–99)
Glucose-Capillary: 147 mg/dL — ABNORMAL HIGH (ref 70–99)
Glucose-Capillary: 151 mg/dL — ABNORMAL HIGH (ref 70–99)
Glucose-Capillary: 115 mg/dL — ABNORMAL HIGH (ref 70–99)
Glucose-Capillary: 124 mg/dL — ABNORMAL HIGH (ref 70–99)
Glucose-Capillary: 125 mg/dL — ABNORMAL HIGH (ref 70–99)
Glucose-Capillary: 166 mg/dL — ABNORMAL HIGH (ref 70–99)
Glucose-Capillary: 107 mg/dL — ABNORMAL HIGH (ref 70–99)
Glucose-Capillary: 115 mg/dL — ABNORMAL HIGH (ref 70–99)
Glucose-Capillary: 121 mg/dL — ABNORMAL HIGH (ref 70–99)
Glucose-Capillary: 158 mg/dL — ABNORMAL HIGH (ref 70–99)
Glucose-Capillary: 185 mg/dL — ABNORMAL HIGH (ref 70–99)
Glucose-Capillary: 120 mg/dL — ABNORMAL HIGH (ref 70–99)
Glucose-Capillary: 126 mg/dL — ABNORMAL HIGH (ref 70–99)
Glucose-Capillary: 136 mg/dL — ABNORMAL HIGH (ref 70–99)

## 2010-06-14 LAB — GLUCOSE, CAPILLARY
Glucose-Capillary: 134 mg/dL — ABNORMAL HIGH (ref 70–99)
Glucose-Capillary: 118 mg/dL — ABNORMAL HIGH (ref 70–99)
Glucose-Capillary: 121 mg/dL — ABNORMAL HIGH (ref 70–99)
Glucose-Capillary: 131 mg/dL — ABNORMAL HIGH (ref 70–99)
Glucose-Capillary: 133 mg/dL — ABNORMAL HIGH (ref 70–99)
Glucose-Capillary: 135 mg/dL — ABNORMAL HIGH (ref 70–99)
Glucose-Capillary: 117 mg/dL — ABNORMAL HIGH (ref 70–99)
Glucose-Capillary: 134 mg/dL — ABNORMAL HIGH (ref 70–99)
Glucose-Capillary: 148 mg/dL — ABNORMAL HIGH (ref 70–99)
Glucose-Capillary: 188 mg/dL — ABNORMAL HIGH (ref 70–99)

## 2010-06-15 LAB — GLUCOSE, CAPILLARY
Glucose-Capillary: 123 mg/dL — ABNORMAL HIGH (ref 70–99)
Glucose-Capillary: 128 mg/dL — ABNORMAL HIGH (ref 70–99)
Glucose-Capillary: 212 mg/dL — ABNORMAL HIGH (ref 70–99)
Glucose-Capillary: 110 mg/dL — ABNORMAL HIGH (ref 70–99)
Glucose-Capillary: 117 mg/dL — ABNORMAL HIGH (ref 70–99)
Glucose-Capillary: 127 mg/dL — ABNORMAL HIGH (ref 70–99)
Glucose-Capillary: 155 mg/dL — ABNORMAL HIGH (ref 70–99)

## 2010-06-16 LAB — GLUCOSE, CAPILLARY
Glucose-Capillary: 107 mg/dL — ABNORMAL HIGH (ref 70–99)
Glucose-Capillary: 118 mg/dL — ABNORMAL HIGH (ref 70–99)
Glucose-Capillary: 119 mg/dL — ABNORMAL HIGH (ref 70–99)
Glucose-Capillary: 120 mg/dL — ABNORMAL HIGH (ref 70–99)
Glucose-Capillary: 127 mg/dL — ABNORMAL HIGH (ref 70–99)
Glucose-Capillary: 129 mg/dL — ABNORMAL HIGH (ref 70–99)
Glucose-Capillary: 135 mg/dL — ABNORMAL HIGH (ref 70–99)
Glucose-Capillary: 136 mg/dL — ABNORMAL HIGH (ref 70–99)
Glucose-Capillary: 144 mg/dL — ABNORMAL HIGH (ref 70–99)
Glucose-Capillary: 145 mg/dL — ABNORMAL HIGH (ref 70–99)
Glucose-Capillary: 41 mg/dL — CL (ref 70–99)
Glucose-Capillary: 110 mg/dL — ABNORMAL HIGH (ref 70–99)

## 2010-06-17 ENCOUNTER — Ambulatory Visit (HOSPITAL_COMMUNITY): Payer: Medicare Other | Attending: Internal Medicine

## 2010-06-17 ENCOUNTER — Inpatient Hospital Stay (HOSPITAL_COMMUNITY): Payer: Medicare Other

## 2010-06-17 DIAGNOSIS — M79609 Pain in unspecified limb: Secondary | ICD-10-CM | POA: Insufficient documentation

## 2010-06-17 DIAGNOSIS — M949 Disorder of cartilage, unspecified: Secondary | ICD-10-CM | POA: Insufficient documentation

## 2010-06-17 DIAGNOSIS — M25559 Pain in unspecified hip: Secondary | ICD-10-CM | POA: Insufficient documentation

## 2010-06-17 DIAGNOSIS — M899 Disorder of bone, unspecified: Secondary | ICD-10-CM | POA: Insufficient documentation

## 2010-06-17 DIAGNOSIS — M7120 Synovial cyst of popliteal space [Baker], unspecified knee: Secondary | ICD-10-CM | POA: Insufficient documentation

## 2010-06-17 DIAGNOSIS — M1710 Unilateral primary osteoarthritis, unspecified knee: Secondary | ICD-10-CM | POA: Insufficient documentation

## 2010-06-20 LAB — GLUCOSE, CAPILLARY
Glucose-Capillary: 111 mg/dL — ABNORMAL HIGH (ref 70–99)
Glucose-Capillary: 125 mg/dL — ABNORMAL HIGH (ref 70–99)
Glucose-Capillary: 129 mg/dL — ABNORMAL HIGH (ref 70–99)
Glucose-Capillary: 141 mg/dL — ABNORMAL HIGH (ref 70–99)
Glucose-Capillary: 174 mg/dL — ABNORMAL HIGH (ref 70–99)
Glucose-Capillary: 98 mg/dL (ref 70–99)

## 2010-06-21 LAB — GLUCOSE, CAPILLARY: Glucose-Capillary: 128 mg/dL — ABNORMAL HIGH (ref 70–99)

## 2010-06-24 ENCOUNTER — Ambulatory Visit (HOSPITAL_COMMUNITY): Payer: Medicare Other | Attending: Internal Medicine

## 2010-06-24 DIAGNOSIS — J449 Chronic obstructive pulmonary disease, unspecified: Secondary | ICD-10-CM | POA: Insufficient documentation

## 2010-06-24 DIAGNOSIS — R05 Cough: Secondary | ICD-10-CM | POA: Insufficient documentation

## 2010-06-24 DIAGNOSIS — R059 Cough, unspecified: Secondary | ICD-10-CM | POA: Insufficient documentation

## 2010-06-24 DIAGNOSIS — J4489 Other specified chronic obstructive pulmonary disease: Secondary | ICD-10-CM | POA: Insufficient documentation

## 2010-06-25 LAB — GLUCOSE, CAPILLARY: Glucose-Capillary: 133 mg/dL — ABNORMAL HIGH (ref 70–99)

## 2010-06-28 LAB — GLUCOSE, CAPILLARY
Glucose-Capillary: 104 mg/dL — ABNORMAL HIGH (ref 70–99)
Glucose-Capillary: 107 mg/dL — ABNORMAL HIGH (ref 70–99)
Glucose-Capillary: 138 mg/dL — ABNORMAL HIGH (ref 70–99)
Glucose-Capillary: 150 mg/dL — ABNORMAL HIGH (ref 70–99)
Glucose-Capillary: 211 mg/dL — ABNORMAL HIGH (ref 70–99)
Glucose-Capillary: 96 mg/dL (ref 70–99)

## 2010-06-29 LAB — GLUCOSE, CAPILLARY
Glucose-Capillary: 112 mg/dL — ABNORMAL HIGH (ref 70–99)
Glucose-Capillary: 106 mg/dL — ABNORMAL HIGH (ref 70–99)
Glucose-Capillary: 122 mg/dL — ABNORMAL HIGH (ref 70–99)
Glucose-Capillary: 124 mg/dL — ABNORMAL HIGH (ref 70–99)
Glucose-Capillary: 146 mg/dL — ABNORMAL HIGH (ref 70–99)

## 2010-06-30 LAB — GLUCOSE, CAPILLARY
Glucose-Capillary: 115 mg/dL — ABNORMAL HIGH (ref 70–99)
Glucose-Capillary: 120 mg/dL — ABNORMAL HIGH (ref 70–99)
Glucose-Capillary: 130 mg/dL — ABNORMAL HIGH (ref 70–99)
Glucose-Capillary: 93 mg/dL (ref 70–99)
Glucose-Capillary: 97 mg/dL (ref 70–99)
Glucose-Capillary: 106 mg/dL — ABNORMAL HIGH (ref 70–99)
Glucose-Capillary: 109 mg/dL — ABNORMAL HIGH (ref 70–99)
Glucose-Capillary: 112 mg/dL — ABNORMAL HIGH (ref 70–99)
Glucose-Capillary: 113 mg/dL — ABNORMAL HIGH (ref 70–99)
Glucose-Capillary: 124 mg/dL — ABNORMAL HIGH (ref 70–99)
Glucose-Capillary: 98 mg/dL (ref 70–99)

## 2010-07-01 LAB — GLUCOSE, CAPILLARY
Glucose-Capillary: 108 mg/dL — ABNORMAL HIGH (ref 70–99)
Glucose-Capillary: 133 mg/dL — ABNORMAL HIGH (ref 70–99)
Glucose-Capillary: 187 mg/dL — ABNORMAL HIGH (ref 70–99)
Glucose-Capillary: 94 mg/dL (ref 70–99)
Glucose-Capillary: 102 mg/dL — ABNORMAL HIGH (ref 70–99)
Glucose-Capillary: 116 mg/dL — ABNORMAL HIGH (ref 70–99)
Glucose-Capillary: 121 mg/dL — ABNORMAL HIGH (ref 70–99)
Glucose-Capillary: 142 mg/dL — ABNORMAL HIGH (ref 70–99)
Glucose-Capillary: 143 mg/dL — ABNORMAL HIGH (ref 70–99)

## 2010-07-02 LAB — GLUCOSE, CAPILLARY
Glucose-Capillary: 123 mg/dL — ABNORMAL HIGH (ref 70–99)
Glucose-Capillary: 129 mg/dL — ABNORMAL HIGH (ref 70–99)
Glucose-Capillary: 97 mg/dL (ref 70–99)
Glucose-Capillary: 107 mg/dL — ABNORMAL HIGH (ref 70–99)
Glucose-Capillary: 114 mg/dL — ABNORMAL HIGH (ref 70–99)
Glucose-Capillary: 144 mg/dL — ABNORMAL HIGH (ref 70–99)
Glucose-Capillary: 144 mg/dL — ABNORMAL HIGH (ref 70–99)

## 2010-07-03 LAB — PROTIME-INR
INR: 2 — ABNORMAL HIGH (ref 0.00–1.49)
Prothrombin Time: 21.8 seconds — ABNORMAL HIGH (ref 11.6–15.2)

## 2010-07-03 LAB — DIFFERENTIAL
Basophils Absolute: 0 10*3/uL (ref 0.0–0.1)
Basophils Relative: 1 % (ref 0–1)
Eosinophils Absolute: 0.1 10*3/uL (ref 0.0–0.7)
Eosinophils Absolute: 0.1 10*3/uL (ref 0.0–0.7)
Eosinophils Relative: 3 % (ref 0–5)
Lymphs Abs: 1.8 10*3/uL (ref 0.7–4.0)
Monocytes Absolute: 0.6 10*3/uL (ref 0.1–1.0)
Monocytes Relative: 12 % (ref 3–12)
Monocytes Relative: 18 % — ABNORMAL HIGH (ref 3–12)
Neutrophils Relative %: 38 % — ABNORMAL LOW (ref 43–77)

## 2010-07-03 LAB — GLUCOSE, CAPILLARY
Glucose-Capillary: 112 mg/dL — ABNORMAL HIGH (ref 70–99)
Glucose-Capillary: 113 mg/dL — ABNORMAL HIGH (ref 70–99)
Glucose-Capillary: 116 mg/dL — ABNORMAL HIGH (ref 70–99)
Glucose-Capillary: 126 mg/dL — ABNORMAL HIGH (ref 70–99)
Glucose-Capillary: 133 mg/dL — ABNORMAL HIGH (ref 70–99)
Glucose-Capillary: 141 mg/dL — ABNORMAL HIGH (ref 70–99)
Glucose-Capillary: 150 mg/dL — ABNORMAL HIGH (ref 70–99)
Glucose-Capillary: 155 mg/dL — ABNORMAL HIGH (ref 70–99)
Glucose-Capillary: 160 mg/dL — ABNORMAL HIGH (ref 70–99)
Glucose-Capillary: 165 mg/dL — ABNORMAL HIGH (ref 70–99)
Glucose-Capillary: 92 mg/dL (ref 70–99)
Glucose-Capillary: 97 mg/dL (ref 70–99)
Glucose-Capillary: 106 mg/dL — ABNORMAL HIGH (ref 70–99)
Glucose-Capillary: 112 mg/dL — ABNORMAL HIGH (ref 70–99)
Glucose-Capillary: 114 mg/dL — ABNORMAL HIGH (ref 70–99)
Glucose-Capillary: 117 mg/dL — ABNORMAL HIGH (ref 70–99)
Glucose-Capillary: 119 mg/dL — ABNORMAL HIGH (ref 70–99)
Glucose-Capillary: 122 mg/dL — ABNORMAL HIGH (ref 70–99)
Glucose-Capillary: 126 mg/dL — ABNORMAL HIGH (ref 70–99)
Glucose-Capillary: 129 mg/dL — ABNORMAL HIGH (ref 70–99)
Glucose-Capillary: 134 mg/dL — ABNORMAL HIGH (ref 70–99)
Glucose-Capillary: 134 mg/dL — ABNORMAL HIGH (ref 70–99)
Glucose-Capillary: 140 mg/dL — ABNORMAL HIGH (ref 70–99)
Glucose-Capillary: 143 mg/dL — ABNORMAL HIGH (ref 70–99)
Glucose-Capillary: 143 mg/dL — ABNORMAL HIGH (ref 70–99)
Glucose-Capillary: 150 mg/dL — ABNORMAL HIGH (ref 70–99)
Glucose-Capillary: 156 mg/dL — ABNORMAL HIGH (ref 70–99)
Glucose-Capillary: 165 mg/dL — ABNORMAL HIGH (ref 70–99)
Glucose-Capillary: 165 mg/dL — ABNORMAL HIGH (ref 70–99)
Glucose-Capillary: 86 mg/dL (ref 70–99)
Glucose-Capillary: 91 mg/dL (ref 70–99)
Glucose-Capillary: 94 mg/dL (ref 70–99)
Glucose-Capillary: 96 mg/dL (ref 70–99)

## 2010-07-03 LAB — CBC
HCT: 29.8 % — ABNORMAL LOW (ref 36.0–46.0)
Hemoglobin: 10.1 g/dL — ABNORMAL LOW (ref 12.0–15.0)
Hemoglobin: 10.1 g/dL — ABNORMAL LOW (ref 12.0–15.0)
MCHC: 33.9 g/dL (ref 30.0–36.0)
MCV: 78.9 fL (ref 78.0–100.0)
MCV: 80.5 fL (ref 78.0–100.0)
RBC: 3.7 MIL/uL — ABNORMAL LOW (ref 3.87–5.11)
RBC: 3.79 MIL/uL — ABNORMAL LOW (ref 3.87–5.11)
RDW: 14.2 % (ref 11.5–15.5)
WBC: 3.8 10*3/uL — ABNORMAL LOW (ref 4.0–10.5)

## 2010-07-03 LAB — BASIC METABOLIC PANEL
CO2: 24 mEq/L (ref 19–32)
Calcium: 8.1 mg/dL — ABNORMAL LOW (ref 8.4–10.5)
Chloride: 109 mEq/L (ref 96–112)
Chloride: 110 mEq/L (ref 96–112)
Creatinine, Ser: 0.83 mg/dL (ref 0.4–1.2)
GFR calc Af Amer: >60 mL/min (ref >60)
Glucose, Bld: 110 mg/dL — ABNORMAL HIGH (ref 70–99)
Potassium: 3.8 mEq/L (ref 3.5–5.1)
Potassium: 4.2 mEq/L (ref 3.5–5.1)
Sodium: 138 mEq/L (ref 135–145)
Sodium: 139 mEq/L (ref 135–145)

## 2010-07-04 LAB — DIFFERENTIAL
Basophils Absolute: 0 10*3/uL (ref 0.0–0.1)
Basophils Absolute: 0 10*3/uL (ref 0.0–0.1)
Basophils Absolute: 0.2 10*3/uL — ABNORMAL HIGH (ref 0.0–0.1)
Basophils Relative: 1 % (ref 0–1)
Basophils Relative: 6 % — ABNORMAL HIGH (ref 0–1)
Eosinophils Absolute: 0 10*3/uL (ref 0.0–0.7)
Eosinophils Absolute: 0 10*3/uL (ref 0.0–0.7)
Eosinophils Absolute: 0.1 10*3/uL (ref 0.0–0.7)
Eosinophils Absolute: 0.2 10*3/uL (ref 0.0–0.7)
Eosinophils Relative: 0 % (ref 0–5)
Eosinophils Relative: 1 % (ref 0–5)
Eosinophils Relative: 2 % (ref 0–5)
Eosinophils Relative: 4 % (ref 0–5)
Lymphocytes Relative: 19 % (ref 12–46)
Lymphocytes Relative: 43 % (ref 12–46)
Lymphocytes Relative: 5 % — ABNORMAL LOW (ref 12–46)
Lymphocytes Relative: 71 % — ABNORMAL HIGH (ref 12–46)
Lymphs Abs: 0.6 10*3/uL — ABNORMAL LOW (ref 0.7–4.0)
Lymphs Abs: 0.8 10*3/uL (ref 0.7–4.0)
Monocytes Absolute: 0.5 10*3/uL (ref 0.1–1.0)
Monocytes Absolute: 0.5 10*3/uL (ref 0.1–1.0)
Monocytes Relative: 8 % (ref 3–12)
Neutro Abs: 10.7 10*3/uL — ABNORMAL HIGH (ref 1.7–7.7)
Neutrophils Relative %: 64 % (ref 43–77)
Neutrophils Relative %: 7 % — ABNORMAL LOW (ref 43–77)
Neutrophils Relative %: 77 % (ref 43–77)
Neutrophils Relative %: 93 % — ABNORMAL HIGH (ref 43–77)

## 2010-07-04 LAB — URINE CULTURE
Colony Count: NO GROWTH
Culture: NO GROWTH

## 2010-07-04 LAB — PROTIME-INR
INR: 1.6 — ABNORMAL HIGH (ref 0.00–1.49)
INR: 2.1 — ABNORMAL HIGH (ref 0.00–1.49)
INR: 2.2 — ABNORMAL HIGH (ref 0.00–1.49)
Prothrombin Time: 25.2 seconds — ABNORMAL HIGH (ref 11.6–15.2)
Prothrombin Time: 26.2 seconds — ABNORMAL HIGH (ref 11.6–15.2)
Prothrombin Time: 32.6 seconds — ABNORMAL HIGH (ref 11.6–15.2)

## 2010-07-04 LAB — GLUCOSE, CAPILLARY
Glucose-Capillary: 101 mg/dL — ABNORMAL HIGH (ref 70–99)
Glucose-Capillary: 109 mg/dL — ABNORMAL HIGH (ref 70–99)
Glucose-Capillary: 112 mg/dL — ABNORMAL HIGH (ref 70–99)
Glucose-Capillary: 114 mg/dL — ABNORMAL HIGH (ref 70–99)
Glucose-Capillary: 117 mg/dL — ABNORMAL HIGH (ref 70–99)
Glucose-Capillary: 121 mg/dL — ABNORMAL HIGH (ref 70–99)
Glucose-Capillary: 130 mg/dL — ABNORMAL HIGH (ref 70–99)
Glucose-Capillary: 154 mg/dL — ABNORMAL HIGH (ref 70–99)
Glucose-Capillary: 162 mg/dL — ABNORMAL HIGH (ref 70–99)

## 2010-07-04 LAB — CULTURE, BLOOD (ROUTINE X 2)
Culture: NO GROWTH
Report Status: 8032010
Report Status: 8032010

## 2010-07-04 LAB — URINALYSIS, ROUTINE W REFLEX MICROSCOPIC
Bilirubin Urine: NEGATIVE
Glucose, UA: NEGATIVE mg/dL
Ketones, ur: 15 mg/dL — AB
Ketones, ur: NEGATIVE mg/dL
Ketones, ur: NEGATIVE mg/dL
Leukocytes, UA: NEGATIVE
Nitrite: NEGATIVE
Nitrite: NEGATIVE
Nitrite: NEGATIVE
Protein, ur: NEGATIVE mg/dL
Specific Gravity, Urine: 1.015 (ref 1.005–1.030)
Urobilinogen, UA: 0.2 mg/dL (ref 0.0–1.0)
pH: 5 (ref 5.0–8.0)
pH: 5.5 (ref 5.0–8.0)
pH: 5.5 (ref 5.0–8.0)

## 2010-07-04 LAB — CBC
HCT: 38.1 % (ref 36.0–46.0)
HCT: 44.4 % (ref 36.0–46.0)
Hemoglobin: 10.6 g/dL — ABNORMAL LOW (ref 12.0–15.0)
Hemoglobin: 12.6 g/dL (ref 12.0–15.0)
Hemoglobin: 12.7 g/dL (ref 12.0–15.0)
Hemoglobin: 12.7 g/dL (ref 12.0–15.0)
Hemoglobin: 14 g/dL (ref 12.0–15.0)
MCHC: 33 g/dL (ref 30.0–36.0)
MCHC: 33.3 g/dL (ref 30.0–36.0)
MCHC: 33.6 g/dL (ref 30.0–36.0)
MCV: 79.6 fL (ref 78.0–100.0)
MCV: 79.7 fL (ref 78.0–100.0)
MCV: 80.1 fL (ref 78.0–100.0)
Platelets: 212 10*3/uL (ref 150–400)
RBC: 3.95 MIL/uL (ref 3.87–5.11)
RBC: 4.81 MIL/uL (ref 3.87–5.11)
RBC: 5.56 MIL/uL — ABNORMAL HIGH (ref 3.87–5.11)
RDW: 13.9 % (ref 11.5–15.5)
RDW: 13.9 % (ref 11.5–15.5)
WBC: 11.6 10*3/uL — ABNORMAL HIGH (ref 4.0–10.5)
WBC: 3.9 10*3/uL — ABNORMAL LOW (ref 4.0–10.5)
WBC: 4.3 10*3/uL (ref 4.0–10.5)

## 2010-07-04 LAB — URINE MICROSCOPIC-ADD ON

## 2010-07-04 LAB — COMPREHENSIVE METABOLIC PANEL
ALT: 20 U/L (ref 0–35)
ALT: 21 U/L (ref 0–35)
ALT: 28 U/L (ref 0–35)
AST: 22 U/L (ref 0–37)
AST: 43 U/L — ABNORMAL HIGH (ref 0–37)
Alkaline Phosphatase: 50 U/L (ref 39–117)
Alkaline Phosphatase: 62 U/L (ref 39–117)
BUN: 16 mg/dL (ref 6–23)
BUN: 8 mg/dL (ref 6–23)
CO2: 25 mEq/L (ref 19–32)
CO2: 25 mEq/L (ref 19–32)
CO2: 27 mEq/L (ref 19–32)
CO2: 28 mEq/L (ref 19–32)
Calcium: 8.1 mg/dL — ABNORMAL LOW (ref 8.4–10.5)
Calcium: 9.3 mg/dL (ref 8.4–10.5)
Chloride: 101 mEq/L (ref 96–112)
Chloride: 107 mEq/L (ref 96–112)
Creatinine, Ser: 1.09 mg/dL (ref 0.4–1.2)
Creatinine, Ser: 1.13 mg/dL (ref 0.4–1.2)
GFR calc Af Amer: 25 mL/min — ABNORMAL LOW (ref >60)
GFR calc Af Amer: 58 mL/min — ABNORMAL LOW (ref >60)
GFR calc non Af Amer: 21 mL/min — ABNORMAL LOW (ref >60)
GFR calc non Af Amer: 46 mL/min — ABNORMAL LOW (ref >60)
GFR calc non Af Amer: 48 mL/min — ABNORMAL LOW (ref >60)
GFR calc non Af Amer: 51 mL/min — ABNORMAL LOW (ref >60)
Glucose, Bld: 101 mg/dL — ABNORMAL HIGH (ref 70–99)
Glucose, Bld: 195 mg/dL — ABNORMAL HIGH (ref 70–99)
Glucose, Bld: 92 mg/dL (ref 70–99)
Potassium: 4 mEq/L (ref 3.5–5.1)
Potassium: 4.7 mEq/L (ref 3.5–5.1)
Sodium: 136 mEq/L (ref 135–145)
Sodium: 138 mEq/L (ref 135–145)
Total Bilirubin: 0.4 mg/dL (ref 0.3–1.2)
Total Bilirubin: 0.5 mg/dL (ref 0.3–1.2)

## 2010-07-04 LAB — HEMOGLOBIN A1C: Hgb A1c MFr Bld: 6.3 % — ABNORMAL HIGH (ref 4.6–6.1)

## 2010-07-04 LAB — CARDIAC PANEL(CRET KIN+CKTOT+MB+TROPI)
CK, MB: 0.6 ng/mL (ref 0.3–4.0)
CK, MB: 0.7 ng/mL (ref 0.3–4.0)
Relative Index: INVALID (ref 0.0–2.5)
Total CK: 24 U/L (ref 7–177)
Troponin I: 0.02 ng/mL (ref 0.00–0.06)
Troponin I: <0.01 ng/mL (ref 0.00–0.06)

## 2010-07-04 LAB — BASIC METABOLIC PANEL
BUN: 18 mg/dL (ref 6–23)
Calcium: 9 mg/dL (ref 8.4–10.5)
Creatinine, Ser: 1.31 mg/dL — ABNORMAL HIGH (ref 0.4–1.2)
GFR calc non Af Amer: 39 mL/min — ABNORMAL LOW (ref >60)
Glucose, Bld: 107 mg/dL — ABNORMAL HIGH (ref 70–99)

## 2010-07-04 LAB — T4, FREE: Free T4: 1.06 ng/dL (ref 0.80–1.80)

## 2010-07-04 LAB — PHOSPHORUS: Phosphorus: 3.2 mg/dL (ref 2.3–4.6)

## 2010-07-04 LAB — APTT: aPTT: 40 seconds — ABNORMAL HIGH (ref 24–37)

## 2010-07-04 LAB — PATHOLOGIST SMEAR REVIEW

## 2010-07-04 LAB — TSH: TSH: 0.439 u[IU]/mL (ref 0.350–4.500)

## 2010-07-05 LAB — GLUCOSE, CAPILLARY
Glucose-Capillary: 132 mg/dL — ABNORMAL HIGH (ref 70–99)
Glucose-Capillary: 162 mg/dL — ABNORMAL HIGH (ref 70–99)

## 2010-07-09 LAB — GLUCOSE, CAPILLARY: Glucose-Capillary: 133 mg/dL — ABNORMAL HIGH (ref 70–99)

## 2010-07-12 LAB — GLUCOSE, CAPILLARY: Glucose-Capillary: 139 mg/dL — ABNORMAL HIGH (ref 70–99)

## 2010-07-17 LAB — GLUCOSE, CAPILLARY

## 2010-07-19 LAB — GLUCOSE, CAPILLARY: Glucose-Capillary: 124 mg/dL — ABNORMAL HIGH (ref 70–99)

## 2010-07-22 ENCOUNTER — Ambulatory Visit (HOSPITAL_COMMUNITY)
Admit: 2010-07-22 | Discharge: 2010-07-22 | Disposition: A | Discharge status: Home / Self Care | Payer: Medicare Other | Attending: Internal Medicine | Admitting: Internal Medicine

## 2010-07-22 ENCOUNTER — Ambulatory Visit (HOSPITAL_COMMUNITY): Payer: Medicare Other | Attending: Internal Medicine

## 2010-07-22 DIAGNOSIS — R55 Syncope and collapse: Secondary | ICD-10-CM | POA: Insufficient documentation

## 2010-07-24 NOTE — Procedures (Signed)
NAME:  Brandi Rice, Brandi Rice                  ACCOUNT NO.:  [id number]  MEDICAL RECORD NO.:  [id number]           PATIENT TYPE:  O  LOCATION:  EE                           FACILITY:  [facility]  PHYSICIAN:  Anastasio Wogan A. Merlene Laughter, M.D. DATE OF BIRTH:  1921-12-18  DATE OF PROCEDURE:  07/22/2010 DATE OF DISCHARGE:  07/22/2010                             EEG INTERPRETATION   REFERRING PHYSICIAN:  [facility].  INDICATIONS:  This is an 77 year old who presents with spells of unresponsiveness.  The study is being done to evaluate for seizures.  MEDICATIONS:  Pletal, Namenda, Neurontin, ferrous sulfate, Os-Cal, aspirin, potassium, Lasix, levothyroxine, Aricept, omeprazole, pravastatin, and metformin.  ANALYSIS:  A 16-channel recording using standard 10/20 measurements is conducted for 20 minutes.  There is a well-formed posterior dominant rhythm of 9.5 to 10 Hz which attenuates with eye opening.  There is awake and drowsy activities recorded.  Photic stimulation and hyperventilation were not done.  There is no focal or lateralized slowing.  There is no epileptiform activity observed.  IMPRESSION:  Normal recording of the awake and drowsy states.     Chana Lindstrom A. Merlene Laughter, M.D.     KAD/MEDQ  D:  07/23/2010  T:  07/24/2010  Job:  [id number]

## 2010-07-26 LAB — GLUCOSE, CAPILLARY
Glucose-Capillary: 137 mg/dL — ABNORMAL HIGH (ref 70–99)
Glucose-Capillary: 127 mg/dL — ABNORMAL HIGH (ref 70–99)

## 2010-07-30 LAB — GLUCOSE, CAPILLARY: Glucose-Capillary: 122 mg/dL — ABNORMAL HIGH (ref 70–99)

## 2010-08-02 LAB — GLUCOSE, CAPILLARY
Glucose-Capillary: 113 mg/dL — ABNORMAL HIGH (ref 70–99)
Glucose-Capillary: 217 mg/dL — ABNORMAL HIGH (ref 70–99)

## 2010-08-06 LAB — GLUCOSE, CAPILLARY
Glucose-Capillary: 109 mg/dL — ABNORMAL HIGH (ref 70–99)
Glucose-Capillary: 122 mg/dL — ABNORMAL HIGH (ref 70–99)
Glucose-Capillary: 125 mg/dL — ABNORMAL HIGH (ref 70–99)

## 2010-08-10 NOTE — H&P (Signed)
NAME:  Brandi Rice, Brandi Rice                  ACCOUNT NO.:  [id number]   MEDICAL RECORD NO.:  [id number]          PATIENT TYPE:  INP   LOCATION:  A306                          FACILITY:  [facility]   PHYSICIAN:  Barbette Merino, M.D.      DATE OF BIRTH:  [birth year]   DATE OF ADMISSION:  10/23/2008  DATE OF DISCHARGE:  LH                              HISTORY & PHYSICAL   PRIMARY CARE PHYSICIAN:  [name], M.D.   PRESENTING COMPLAINT:  Fever.   HISTORY OF PRESENT ILLNESS:  The patient is an 77 year old African  American female that was just discharged from the hospital 3 days ago  after hospitalization with diagnosis of failure to thrive, anorexia,  etc.  The patient had full workup and was feeling much better.  After  discharge the patient apparently did well briefly until today, when she  started having fever.  She continued to have pain in her lower  extremities from her neuropathy, but then the fever came at 103 at home.  Hence, she was brought to the emergency room.  She denied any specific  cough.  The patient has advanced dementia and is therefore not a very  good historian.  Her only complaint is lower extremity pain.  The  daughter said that she has been weak.  She has been participating in  physical therapy but then she started shaking and feeling cold.  She has  been having cough since she left the hospital, and there was no  significant change.  Other than that, the patient denied any specific  complaints again.  She denied any nausea, vomiting or diarrhea.   PAST MEDICAL HISTORY:  1. Significant for recent admission for failure to thrive.  2. History of pulmonary embolism, currently on Coumadin and Lovenox.  3. Dementia.  4. Anorexia  5. Hypothyroidism.  6. Hypertension.  7. Type 2 diabetes.  8. History of anemia.  9. History of acute renal failure.  10.Bronchitis.  11.Peripheral vascular disease.  12.Recent weight loss, about 9 kg in a year.   ALLERGIES:  Mainly to  LATEX.   MEDICATIONS:  1. Neurontin 300 mg daily.  2. Megace 40 mg daily.  3. She still on Lovenox 90 mg twice a day.  4. Temazepam 15 mg at night.  5. Os-Cal plus D 2 tablets daily.  6. Hydrochlorothiazide 25 mg daily.  7. Cilostazol 50 mg twice a day.  8. Potassium 20 mEq daily.  9. Synthroid 50 mcg daily  10.Coumadin currently at 5 mg daily.  11.Aricept 10 mg daily.  12.Namenda 10 mg twice a day.  13.Pravastatin 80 mg at night.  14.Metformin 5 mg b.i.d.  15.Ferrous sulfate 375 mg b.i.d.   SOCIAL HISTORY:  The patient lives in [place].  She lives with her  grandson.  No tobacco, alcohol or IV drug use.  Has past history of  tobacco smoking but quit more than 30 years ago.  She was typically  independent prior to last week's admission but has been immobile and  weak recently.   FAMILY HISTORY:  Denied any significant  family history.   REVIEW OF SYSTEMS:  A 14-point review of systems attempted and for the  most part is negative except per HPI.   PHYSICAL EXAMINATION:  Temperature here in the ED was 101.3, her blood  pressure 113/53, pulse 117, respiratory rate 20, saturations 97% on room  air.  GENERAL:  The patient looks acutely ill-looking, withdrawn.  Seems to be  in mild distress due to pain.  HEENT:  PERRL.  EOMI.  She is edentulous.  NECK:  Supple.  No JVD, no lymphadenopathy.  No carotid bruits.  No  palpable thyromegaly.  RESPIRATORY:  She has good air entry bilaterally.  No wheezes, no rales,  no crackles.  CARDIOVASCULAR SYSTEM:  She has S1, S2.  No audible murmurs.  ABDOMEN:  Soft, nontender, with positive bowel sounds.  EXTREMITIES:  Showed no edema, cyanosis or clubbing.  She has some  tenderness in her calf region to but more with very deep palpation.   LABORATORY DATA:  White count is 11.6, hemoglobin 12.6, platelet count  316, with a left shift, ANC of 10.7.  Sodium 141, potassium 3.2,  chloride 103, CO2 27, glucose 116, BUN 8, creatinine 1.09, with  AST of  43, albumin 3.3 and calcium 9.3.  Urinalysis showed few squamous  epithelial cells, wbc's 3-6 and rare bacteria with small leukocyte  esterase.  Chest x-ray showed mainly bibasilar atelectasis.   ASSESSMENT:  Therefore, this is an 77 year old female presenting with  acute febrile illness after a recent hospitalization.  The source of her  fever is currently unknown.  The patient has shown failure to thrive  over the 75 last couple of months and the differential for a fever is  therefore varied.  This could be secondary to some urinary tract  infection or it could be some pneumonia that is not manifest yet.  It  could also be occult malignancy that is showing up as fever.  The  patient clearly has systemic inflammatory response syndrome with her  tachycardia, fever, leukocytosis, etc.   PLAN:  1. Acute febrile illness.  We will admit the patient, get blood      cultures, urine cultures.  We may even consider CT chest, abdomen,      etc., looking for maybe lymphoma if symptoms of fever persist,      especially in the setting of weight loss.  In the meantime, I will      start her on empiric therapy for possible hospital-acquired      infection since she just left the hospital, especially possibly      hospital-acquired pneumonia.  I will put her on some vancomycin and      Zosyn initially while awaiting cultures and further workup.  2. Dementia.  She seems to be stable and we will maintain the patient      on her home medications.  3. Diabetes.  I will hold metformin and get her on sliding scale      insulin.  4. Hypokalemia.  We will replete her potassium appropriately.  5. Peripheral neuropathy.  Again, we will continue with Neurontin and      further pain medications.  6. Dyslipidemia.  We will continue with pravastatin as much as      possible.  7. Recent pulmonary embolus.  We will check her PT/INR and continue      with Coumadin.  We may use Lovenox bridge while waiting for  her      PT/INR to be therapeutic.  The patient clearly has been failing to thrive, and further treatment  will depend on how she responds.  If the cause of her fever is not clear  from our initial tests, we might even consider infectious disease  consult.      Barbette Merino, M.D.  Electronically Signed     LG/MEDQ  D:  10/24/2008  T:  10/24/2008  Job:  [id number]

## 2010-08-10 NOTE — Discharge Summary (Signed)
NAME:  Brandi Rice, Brandi Rice                  ACCOUNT NO.:  [id number]   MEDICAL RECORD NO.:  [id number]          PATIENT TYPE:  INP   LOCATION:  [id number]                          FACILITY:  [facility]   PHYSICIAN:  Bonnielee Haff, MD     DATE OF BIRTH:  [birth year]   DATE OF ADMISSION:  10/17/2008  DATE OF DISCHARGE:  07/26/2010LH                               DISCHARGE SUMMARY   PRIMARY CARE PHYSICIAN:  Tula Nakayama, M.D.   Please review H&P dictated at the time of admission for details  regarding the patient's presenting illness.   CONSULTATIONS:  During this hospitalization no consultations were  obtained.   IMAGING STUDIES:  1. Chest x-ray x2 with no pneumonias.  2. Carotid Doppler which showed no evidence for significant stenosis.  3. CT head which did not show any acute abnormality.  However, white      matter disease and volume loss was also noted.   DISCHARGE DIAGNOSES:  1. Anorexia and failure to thrive likely secondary to dementia.  2. Weight loss of 9 kg in 1 year, likely as a result of anorexia and      failure to thrive but requires further evaluation.  3. Acute renal failure secondary to dehydration, resolved.  4. Cough.  This is a result of resolving bronchitis, stable.  5. Lower extremity neuropathic pain, resolved.  6. Peripheral vascular disease, stable.  7. Advanced dementia, stable.  8. History of PE requiring Lovenox and warfarin for the next few days.  9. Stable anemia.   BRIEF HOSPITAL COURSE:  1. Failure to thrive and anorexia. This is an 77 year old African      American female who was sent over from a doctor's office for      weakness, poor appetite.  Was felt that the patient could have      occult infection.  The patient was thoroughly investigated in the      hospital.  She had a negative UA, negative chest  x-ray. CT scan was negative for acute abnormalities.  However, but did  show a lot of volume loss as well as atrophy and white matter disease.  It was also  noted that she had a weight loss of 9 kg over the last 1  year.  It appears that the patient's symptoms are most likely as a  result of dementia.  However, the weight loss is concerning.  She tells  me that she is up-to-date on all of her screening tests and other  preventive medicine.  She has had a colonoscopy.  She is up-to-date on  mammograms.  I do not suspect any occult malignancy.  However, that may  have to be considered if she has had screening test done recently.  However, I think her poor appetite is playing a predominant role here.  What I have discussed with the patient and family is that we will try  appetite stimulant in the form of Megace. Will see how she does over the  next couple of months and if the patient does not improve and if she  starts to get  worse, this is something that she will need to discuss  with Dr. Moshe Cipro.  I do not have access to many of the screening tests  that have been done and I am sure Dr. Moshe Cipro has reports of these  studies.  1. Acute renal failure.  She presented with evidence for dehydration      with elevated BUN and creatinine at 26 and 2.2 and with IV fluids      they have come down to almost normal.  She is tolerating p.o.      intake at this time.  2. Lower extremity pain.  She was complaining of pain bilateral lower      extremities.  She has a history of bulging disks and it is felt      that the lower extremity pain was secondary to radiculopathy.  She      was prescribed Neurontin and she has experienced relief from her      pain.  3. History of PE.  The patient does have a remote history of breast      cancer.  She was diagnosed with PE in 2006 and it was felt at that      time that she will require lifelong anticoagulation.  The patient's      INR was 2.9 when she came in. Today, unfortunately it is 1.6. We      will arrange bridging Lovenox therapy for the therapy for the next      4 days.  4. Leukopenia. She was found to have  low white cell count of 3.9 when      she came in. The differential just showed slightly leukopenia. When      she presented to the [facility] ED on October 13, 2008, she had a      neutropenia as well.  Those issues seem to have resolved.  Her      total white count, however, still stayed a little bit low.      Etiology for this is not very clear.  It could be possibly from      some of the medications she is taking.  5. Tinnitus.  She presented with complaints of hearing sounds in both      her ears.  It was noted that she was taking Motrin on a regular      basis twice a day.  We discontinued it and her tinnitus has      resolved. I have asked her not to take Motrin from now on.  6. History of dementia. It is quite advanced although patient is quite      lucid at times. Continue with Aricept and Namenda.  7. The rest of her medical issues include hypertension, type 2      diabetes, anemia, hypothyroidism, all stable.   PHYSICAL EXAMINATION:  On the day of discharge the patient is feeling  well.  She was seen by physical therapist who feels that the patient  will require home health PT. Otherwise, patient denies any other  complaints.  She is still feeling weak.  Vital signs are all stable,  saturating 99% on room air.  Lungs: Clear to auscultation bilaterally.  Cardiovascular system is normal regular.  No murmurs appreciated.  No S3-  S4, no rubs, no bruits.  Abdomen: Soft.  No lower extremity edema is  present.   LABORATORY DATA:  This morning INR was 1.6.   DISCHARGE MEDICATIONS:  New medications include:  1. Neurontin  300 mg once daily.  2. Megace suspension 400 mg once daily.  3. Lovenox 90 mg subcu q. 12 for 4 days.  Continue with older medications including:  1. Temazepam 15 mg at bedtime.  2. Os-Cal Plus D twice daily.  3. Hydrochlorothiazide 25 mg once daily.  4. Cilostazol 50 mg twice daily.  5. Klor-Con 20 mEq once daily.  6. Levothyroxine 50 mcg once daily.  7.  Warfarin 5 mg once daily.  8. Aricept 10 mg daily.  9. Namenda 10 mg b.i.d.  10.Pravastatin 80 mg q.p.m.  11.Metformin 5 mg b.i.d.  12.Ferrous sulfate 325 mg b.i.d.  13.She has been asked to discontinue ibuprofen.   STUDIES TO BE DONE:  1. Include a PT/INR on October 22, 2008 by home health.  2. Home health PT and RN, this will be scheduled.   RECOMMENDATIONS TO THE PRIMARY MEDICAL DOCTOR:  Include following up on  the patient's weight loss as well as her PT/INR.   PENDING STUDIES:  No pending studies at this time.   DIET:  Modified carbohydrate.   ACTIVITY:  Use a walker per physical therapy recommendations.   DURATION OF DISCHARGE SUMMARY:  Total time on this discharge encounter  is 40 minutes.      Bonnielee Haff, MD  Electronically Signed     GK/MEDQ  D:  10/20/2008  T:  10/20/2008  Job:  [id number]   cc:   [name], M.D.  Fax: [phone]

## 2010-08-10 NOTE — Group Therapy Note (Signed)
NAME:  Brandi Rice, Brandi Rice                  ACCOUNT NO.:  [id number]   MEDICAL RECORD NO.:  [id number]          PATIENT TYPE:  INP   LOCATION:  A306                          FACILITY:  [facility]   PHYSICIAN:  Melissa L. Lovena Le, MD  DATE OF BIRTH:  July 15, 1921   DATE OF PROCEDURE:  10/27/2008  DATE OF DISCHARGE:                                 PROGRESS NOTE   SUBJECTIVE:  I met with the patient and her daughter to review the  current findings and discuss a plan of care.  The patient is sitting in  bed having her lunch.  She is awake, alert and coherent in no acute  distress.  The patient relates that she is having no difficulty  swallowing, eating, passing her urine and she has no diarrhea but she  states she had no raw feeling that she could not describe other than it  felt bad but that subsequently has improved.  The patient's daughter was  concerned that she was under the impression that her mother had a hugely  elevated white count.  In reviewing her records, the only leukocyte  count found to be elevated was back on the 29th when her total white  count was 11.6.  Subsequent white counts the next day were back within  normal limits which seemed odd and unusual.  Nevertheless, it is the  information that we have available.   The patient remains on Zosyn at this time presumptively for treating  bronchitis and I believe it probably was in place to treat a urinary  tract infection as the first analysis of her urine did show trace  leukocyte esterase.   Her vital signs:  Temperature was 98.2, blood pressure 146/72, pulse 79,  respirations 18, saturation 100%.  She has not had fever at all as far  as during this hospital stay.  Her intake and outputs have been  partially recorded.  Yesterday she was 1755 in with 8 different voids.  She has had 3 stools, all were today.   PHYSICAL EXAMINATION:  GENERAL:  This is a moderate obese African  American female in no acute distress.  HEENT:  Normocephalic,  atraumatic.  Pupils equally round and reactive to  light.  Extraocular muscles are intact.  Mucous membranes are moist.  NECK:  Supple.  There is no JVD.  No lymphadenopathy.  No carotid  bruits.  CHEST:  Decreased but clear to auscultation.  No rales, rhonchi or  wheezes.  CARDIOVASCULAR:  Regular rate and rhythm.  Positive S1 and S2.  No S3 or  S4.  No murmurs, rubs or gallops.  ABDOMEN:  Soft, nontender, nondistended with positive bowel sounds.  There is no hepatosplenomegaly.  No guarding.  No rebound.  EXTREMITIES:  Show 2+ edema.  NEUROLOGIC:  She is awake, alert, oriented.  Cranial nerves II-XII  appear to be intact.  Power appears to be 5/5 with passive range of  motion on assessment.   PERTINENT LABS:  She has blood cultures which are negative.  Sodium 139,  potassium 4.2, chloride 110, CO2 23, BUN 10, creatinine  0.83 and glucose  of 117.  PT shows PT of 22.6 and INR of 2.0.  CBC:  White count is 3.8,  hemoglobin 10.1, hematocrit 29.9 and platelets 248.  Urine culture is  greater than 100,000 colonies but it is multiple bacteria and  leukocytes, none are predominant.  Urine culture on the 23rd showed no  growth.   ASSESSMENT/PLAN:  The patient is an 77 year old African American female  who presented to the emergency room with increasing failure to thrive,  anorexia.  The patient continues to complain of pain in her lower  extremities for her neuropathy and she had a fever at home of 103.  A  source is not clearly identified other than the fact that she had trace  leukocytes in her urine.  The patient remains on Zosyn likely to cover  that urine specimen.  1. Fever of unclear etiology with one elevated WBC count amongst many      negative WBC counts.  For now, we will continue the antibiotics      therapy.  2. She unfortunately is starting to get edema in her lower extremities      and may need some diuresis.  She will continue to work with      physical therapy and her  plan is to return to [facility] once she      is medically stable.  3. Mild dementia.  We will continue her Aricept.  4. Peripheral neuropathy.  Continue her Neurontin.  5. Hypothyroidism.  Continue her levothyroxine.  6. Gastroesophageal reflux disease.  Continue her Protonix.  7. Coumadin.  She is currently minimally therapeutic on her Coumadin.      She takes this for a history of pulmonary embolism.  8. History of acute renal failure, currently stable.  9. History of peripheral vascular disease.  As stated, being treated      for a suspected neuropathy.  10.With regard to her failure to thrive and anorexia, we will continue      to monitor p.o. intakes and try to identify source of infection and      that should be continued to be treated for now.  We will reevaluate      her in the morning.      Melissa L. Lovena Le, MD  Electronically Signed     MLT/MEDQ  D:  10/27/2008  T:  10/28/2008  Job:  [id number]

## 2010-08-10 NOTE — Discharge Summary (Signed)
NAME:  Brandi Rice, Brandi Rice                  ACCOUNT NO.:  [id number]   MEDICAL RECORD NO.:  [id number]          PATIENT TYPE:  OBV   LOCATION:  IC01                          FACILITY:  [facility]   PHYSICIAN:  Bonnielee Haff, MD     DATE OF BIRTH:  May 20, 1921   DATE OF ADMISSION:  01/20/2008  DATE OF DISCHARGE:  10/26/2009LH                               DISCHARGE SUMMARY   PRIMARY CARE PHYSICIAN:  [name], M.D.   CARDIOLOGIST:  Cristopher Estimable. Lattie Haw, MD, [facility]   DISCHARGE DIAGNOSIS:  1. Possible transient ischemic attack with slurred speech, resolved.  2. Hypertension.  3. Type 2 diabetes.  4. History of pulmonary embolism on Coumadin.  5. Alzheimer's dementia.  6. Hypothyroidism.  7. Dyslipidemia.   Please review H&P dictated yesterday.   BRIEF HOSPITAL COURSE:  Briefly this is an 77 year old African American  female who was in her church when her head started feeling heavy.  She  apparently developed slurred speech but did not have any other focal  weakness.  She was brought into the ED for further evaluation.  CT of  the head was done which did not reveal any acute abnormalities.  Chest x-  ray was also unremarkable.  Carotid Dopplers were done this morning  which revealed minimal plaque formation in the carotid systems without  evidence of significant stenosis.  MRI has been done and I have  discussed this with the radiologist (Dr. Barbie Banner) at [facility] and there  is no evidence for acute stroke. Small vessel  disease is noted.   She has been seen by physical therapy and thought to be at baseline.  Does not need any kind of assistive device.  She also underwent a 2-D  echocardiogram which showed an EF of 55-60% with no wall motion  abnormalities.  No obvious clot was identified.   Patient has done well during the hospital stay.  Her speech has  normalized, although when we saw her in the last night, her speech did  not appear to be slurred.  Homocysteine level was 7.1.  Thyroid  function  tests are okay.  INR this morning was 1.7.   On the day of discharge the patient is feeling quite well.  No  complaints offered.  Vital signs are all stable.  Blood pressure is  reasonably controlled.  Cardiac exam and lung exam were all within  normal limits.  Neurologically she does not have any focal deficits at  this time.   All the test done have been discussed above.  So she is basically stable  for discharge, which can happen tomorrow.  She does not have a ride to  go home at this time.   DISCHARGE MEDICATIONS:  1. Coumadin 5 mg daily except Wednesday when she needs to take 2.5 mg.  2. Levothyroxine 50 mcg daily.  3. Cilostazol 50 mg b.i.d.  4. Klor-Con 10 mEq daily.  5. Namenda 10 mg b.i.d.  6. Amitriptyline 10 mg at bedtime.  7. Metformin 5 mg b.i.d.  8. Aricept 10 mg daily.  9. HCTZ 25 mg  daily.  10.Pravastatin 40 mg bedtime.  11.Ferrous sulfate 325 mg daily.  12.Os-Cal Plus D b.i.d.   No new medications have been initiated.   FOLLOW UP:  Follow up with Dr. Moshe Cipro within a week, with Dr. Merlene Laughter  in 3-4 weeks.   DIET:  Heart-healthy.   ACTIVITY:  Physical activity as before.   Total time on this discharge encounter:  35 minutes.      Bonnielee Haff, MD  Electronically Signed     GK/MEDQ  D:  01/21/2008  T:  01/22/2008  Job:  [id number]   cc:   Trey Sailors A. Merlene Laughter, M.D.  Fax: [place] Moshe Cipro, M.D.  Fax: [phone]

## 2010-08-10 NOTE — H&P (Signed)
Brandi Rice, BOTTOMLEY                  ACCOUNT NO.:  [id number]   MEDICAL RECORD NO.:  [id number]          PATIENT TYPE:  INP   LOCATION:  [id number]                          FACILITY:  [facility]   PHYSICIAN:  Bonnielee Haff, MD     DATE OF BIRTH:  08-Apr-1921   DATE OF ADMISSION:  10/17/2008  DATE OF DISCHARGE:  LH                              HISTORY & PHYSICAL   PRIMARY CARE PHYSICIAN:  Brandi Rice. Brandi Rice, M.D.   ADMISSION DIAGNOSES:  1. Failure to thrive.  2. Anorexia.  3. Tinnitus.  4. Leg pain.  5. History of hypertension.  6. Type 2 diabetes.  7. History of pulmonary embolism on Coumadin.  8. History of Alzheimer's dementia.  9. Hypothyroidism.   CHIEF COMPLAINT:  Unable to eat.   HISTORY OF PRESENT ILLNESS:  The patient is an 77 year old African  American female who according to her daughter was in her usual state of  health until about a month ago when she started developing poor  appetite.  She would not eat properly.  She was complaining of severe  pain in both her legs.  She would cry with these pains.  She apparently  has a bulging disk in her back which radiates to both these legs.  She,  however, has been able to walk and does not report any weakness in the  legs.  The patient has dementia so the history is somewhat limited.  She  does have a cough with whitish expectoration but denies any fever or  chills.  Denies any headache but admits to hearing a rumbling sound in  her head that has been ongoing for the past couple of weeks at least.  She admits to one episode of nausea and vomiting after she came into the  hospital but none before and none since then.  She denies any chest  pains.  Denies any abdominal pain.  No diarrhea.  Her last bowel  movement was 2 days ago which she says is quite unusual for her.  She  denies any painful urination or frequent urination.   She tells me she was at [facility] a few days ago and actually she was  there on July 19 with complaints of  cough and similar issues.  She  underwent a chest x-ray which did not show any evidence for pneumonia,  did show some atelectasis.  They did a UA which was negative for  infection.  She was noted to have leukopenia so the patient was  discharged from the ED.  So a lot of nonspecific symptoms at this time.   MEDICATIONS AT HOME:  Include the following:  1. Temazepam 15 mg at bedtime.  2. Ibuprofen 800 mg twice daily.  3. Os-Cal Plus D once daily.  4. Hydrochlorothiazide 25 mg daily.  5. Cilostazol 50 mg b.i.d.  6. Klor-Con 20 mEq daily.  7. Levothyroxine 50 mcg daily.  8. Warfarin 5 mg.  9. Aricept 10 mg daily.  10.Namenda 10 mg twice daily.  11.Pravastatin 80 mg at bedtime.  12.Metformin 500 mg b.i.d.  13.Ferrous sulfate  325 mg b.i.d.  14.She recently finished a course of Bactrim, prednisone and      benzonatate on October 02, 2008.   ALLERGIES:  Include LATEX.   PAST MEDICAL HISTORY:  She was admitted back in October of 2009 for a  possible transient ischemic attack, MRI was negative for stroke.  She  has had a history of goiter requiring surgery.  She has a history of  hypertension, peripheral vascular disease.  PE diagnosed 2 years ago  actually now almost 3 years ago for which she is still on Coumadin,  history of SVT, diabetes type 2, ablation for SVT in the past, history  of breast cancer with a left mastectomy in the past.   SOCIAL HISTORY:  Lives in [place] with her grandson.  Denies smoking,  alcohol, illicit drug use at this time but used to be a smoker 35-40  years ago.  Independent with the daily activities but has not been able  to do recently.  Is able to walk as well without any assistive device.   FAMILY HISTORY:  Unremarkable per the patient.   REVIEW OF SYSTEMS:  Unable to do because of dementia except as mentioned  in the HPI.   PHYSICAL EXAMINATION:  VITAL SIGNS:  Vital signs actually are pending.  We will check them as soon as we finish this  dictation.  GENERAL:  This is a very pleasant elderly black female in no distress.  HEENT:  There is no pallor, no icterus.  Head is normocephalic,  atraumatic.  Oral mucous membranes are moist.  No oral lesions are  noted.  NECK:  Soft and supple.  No thyromegaly is appreciated.  No  lymphadenopathy is noted.  LUNGS:  Clear to auscultation bilaterally.  No wheezing, rales or  rhonchi.  CARDIOVASCULAR:  S1, S2 is normal, regular.  No S3, S4.  No murmurs  appreciated.  She has got a bruit bilaterally, left side greater than  the right.  ABDOMEN:  Soft, nontender, nondistended.  Bowel sounds are present.  No  masses or organomegaly is appreciated.  EXTREMITIES:  Show no edema.  GU:  Deferred.  RECTAL:  Deferred at this time.  MUSCULOSKELETAL:  Exam otherwise unremarkable.  She has got good  peripheral pulses.  No calf tenderness.  No erythema is noted in both  the legs.  NEUROLOGICAL:  She is alert, oriented to place, person but not so much  to time.  No cranial deficits.  No other neurological deficits are  noted.   LABS:  None available at this time.   Of note, back on July 19 at [facility] she was found to have a white  cell count of 3.5.  ANC was 0.2.  She was found to have few atypical  lymphocytes, platelet count 189, hemoglobin was normal.  Electrolytes  were pretty unremarkable.  UA was negative for infection.  Chest x-ray  was negative.   ASSESSMENT:  This is an 77 year old African American female who presents  with failure to thrive, anorexia, tinnitus, leg pain.  At this point we  do not have a clear etiology for all her symptoms.  UA will be repeated.  Chest x-ray will be repeated.  Tinnitus is somewhat concerning.  She is  on Motrin which can definitely cause aural toxicity.  I am also hearing  bruits.  She could have recurrent carotid artery disease though back in  October of 2009 she was not found to have this.  MRA done in  October of  2009 did suggest a 5 mm  atherosclerotic aneurysm arising in the proximal  cavernous IC on the left.  This will have to be kept in mind.   PLAN:  1. Failure to thrive with anorexia.  Rule out infection with a UA and      a chest x-ray.  Diet will be resumed slowly.  I do not suspect any      intra-abdominal process at this time.  2. Tinnitus.  Stop the Motrin, check carotid Dopplers, check CT head.      We may have to repeat the MRA on this patient.  3. Leg pain possibly from the supposed disk disease that she has in      the back.  She did have an MRA of the spine in 2005 which does show      diffuse disk desiccation and herniations L5-S1, L4-5 so possibly      that is what is causing her pain so we will try Neurontin for this      individual.  4. History of dementia.  Continue the Aricept and Namenda.  5. History of diabetes.  Continue with metformin, sliding scale      coverage will be provided.  HbA1c will be checked.  6. Hypertension.  Continue with HCTZ.  7. Anticoagulation for past history of pulmonary embolus.  Will check      a PT/INR and proceed from there.  8. History of hypothyroidism.  Continue Synthroid.   Further management decisions will depend on results of further testing  and patient's response to treatment      Bonnielee Haff, MD  Electronically Signed     GK/MEDQ  D:  10/17/2008  T:  10/17/2008  Job:  [id number]   cc:   Brandi Rice. Brandi Rice, M.D.  Fax: [phone]

## 2010-08-10 NOTE — Discharge Summary (Signed)
NAME:  Brandi Rice, Brandi Rice                  ACCOUNT NO.:  [id number]   MEDICAL RECORD NO.:  [id number]          PATIENT TYPE:  INP   LOCATION:  [id number]                          FACILITY:  [facility]   PHYSICIAN:  Melissa L. Lovena Le, MD  DATE OF BIRTH:  07-23-1921   DATE OF ADMISSION:  10/23/2008  DATE OF DISCHARGE:  08/03/2010LH                               DISCHARGE SUMMARY   DISCHARGE DIAGNOSES:  1. Acute febrile illness of unclear etiology.  The patient was      returned to the hospital after being seen and evaluated October 17, 2008 through October 20, 2008.  At that time she was being seen for      anorexia and failure to thrive.  She had a cough, but was not      diagnosed at that time with any pneumonia.  The patient was      stabilized and discharged and returned to the hospital on October 24, 2008 with a fever of 101.3 and a slight elevation in her white cell      count of 11.6.  The patient was admitted to the hospital and      started on IV Zosyn.  The patient has subsequently never had any      further fever and never had any further elevations in her white      count.  At this time the best explanation is for an early possible      pneumonia since the patient has been coughing.  Her chest x-ray      however did not reveal any confluent infiltrate.  Her spine was x-      rayed to rule out possible osteomyelitis.  This also appeared      normal without any evidence for infection.  The patient will be      maintained on two more days of Avelox which would give her a      complete 7-day course of antibiotic therapy.  It may be extended to      10 days if the patient continues to have cough.  2. Diabetes.  The patient has been off of her metformin and has had      moderately low blood sugars in the 90s and therefore have not      resumed her metformin.  She can be monitored with a.c. and evening      evaluation and coverage with sliding scale insulin if necessary      until her diet is more  established.  3. Peripheral vascular disease.  The patient will be maintained on her      Neurontin and Pletal.  4. Hypothyroidism.  She will be maintained on her Synthroid 50 mcg      daily.  5. Pulmonary embolus.  The patient remains on Lovenox 90 mg, but this      can be discontinued when her INR is above 2.  Our goal is 2 to 2.5.      She currently remains on Coumadin.  6. Dementia.  The patient will be maintained on her Aricept and      Namenda.  7. Failure to thrive.  She will be maintained on her Megace for her      anorexia.  8. Hypertension.  The patient is currently on hydrochlorothiazide, but      no other medications for her blood pressure which at present is      running between the ranges of 114/69 to 148/69.  Further      medications can be added if necessary, but at this time of debility      I would marginalize new changes to her blood pressure unless they      are excessively high.  9. The patient has a history of anemia which at present has been      stable.  Her discharging hemoglobin is 10.1 which has been the      level that she has been post rehydration.  Upon arrival to the      hospital she may have been slightly dehydrated with a hemoglobin of      12.6.  Would recommend an intermittent check on the status of her      hemoglobin by the outpatient physician.   MEDICATIONS AT THE TIME OF DISCHARGE:  1. Neurontin 300 mg daily.  2. Megace 400 mg daily.  3. Lovenox 90 mg subcutaneously over 12 hours until her INR is 2 to      2.5.  4. Temazepam 15 mg at bedtime.  5. Os-Cal with D 500 mg twice daily.  6. Hydrochlorothiazide 25 mg daily.  7. Aricept 10 mg daily.  8. Ferrous sulfate 325 mg twice daily.  9. Pletal 50 mg twice daily.  10.Synthroid 50 mcg once daily.  11.Megace 400 mg daily.  12.Namenda 10 mg twice daily.  13.Potassium 20 mEq daily.  14.Coumadin 5 mg daily.  15.Pravastatin 80 mg at bedtime.  16.Metformin 500 twice daily has been held secondary  to lowish blood      sugars with poor intake.  17.Ferrous sulfate 325 mg twice daily.  18.Will put her on omeprazole 20 mg daily.  19.Suprax 400 mg p.o. nightly in the place of her Zosyn.  Would      probably continue her antibiotic therapy at least for 7 days but      extend that to 10 if she is continuing to have a loose cough.   HOSPITAL COURSE:  The patient was admitted to the general medical floor  with telemetry.  She was evaluated for sites of infection including lung  and urine.  Neither site had any specific evidence for infection.  She  was empirically started on Rocephin and vancomycin.  Without any source  for infection she was converted to Zosyn to cover what was presumed to  be a possible bronchitis/early pneumonia.  The patient never had another  elevation in white count and never had another temperature during the  course of the hospital stay.  Therefore without evidence for source of  infection, I need to pursue that this may have represented a bronchitis  at least and will cover her was Suprax for a couple more days.   The patient did develop a little bit of lower extremity edema which  today seems to be improved with her hydrochlorothiazide on board.   On the day of discharge her vital signs have been stable.  T-max was  98.4, blood pressure 148/69, pulse 73, respirations 20, saturation 98%.  Generally this is a  very pleasant African American female in no acute  distress.  She does have a slight touch of dementia and relies on her  daughter for certain cues, but generally she states that she has had no  further pain in her joints the way she described earlier on in the stay.  She denies shortness of breath, but still has a little cough,  nonproductive of sputum.  She is otherwise normocephalic, atraumatic.  Pupils equal, round and reactive to light.  Extraocular muscles intact.  Mucous membranes are moist.  Neck is supple.  There is no JVD, no lymph  nodes, no  carotid bruits.  She has no oral lesions or lip lesions.  She  has no nasal discharge.  Her chest is decreased, but clear to  auscultation.  There is no rhonchi, rales or wheezes.  Cardiovascular is  regular rate and rhythm, positive S1, S2.  No S3, S4.  No murmurs, rubs  or gallops.  She has no heaves or thrill.  Her apical impulse is  nondisplaced.  Abdomen is obese, nontender, nondistended with positive  bowel sounds.  There is no hepatosplenomegaly, no guarding, no rebound.  Extremities show trace to 1 edema in the bilateral lower extremities  that are symmetrical.  Otherwise cranial nerves II-XII are intact.  Power is 5/5.  DTRs are 2+.  Plantars downgoing and she does have some  memory deficits.   Pertinent laboratories during the hospital stay reveal negative blood  cultures x5 days.  Sodium is 139, potassium 4.2, chloride 110, CO2 23,  BUN 10, creatinine 0.83 and glucose is 117.  Her PT today is 21.8 and  1.9 with the INR.  Her last white count was 3.8 with hemoglobin 10.1,  hematocrit 29.9 and platelets of 248.  Please note that the patient did  have an elevation in her hemoglobin to 12.9 on arrival, but I suspect  that she may be volume contracted as she has remained stable at this  level for the course of the hospital stay.  Please note that her  hemoglobin A1c is 6.3.   At this time the patient is deemed stable for discharge to the [facility] for further rehabilitation.  The presumptive diagnosis is a  bronchitis versus early pneumonia.  The patient is responding  appropriately to antibiotic therapy and I feel comfortable allowing her  to continue a small course of cephalosporin as an outpatient.  If she  develops any further elevations in her white count or signs or symptoms  of worsening pneumonia, she will need to be reevaluated.  I suspect that  there could be an underlying element of aspiration although she does  have clear voice and does not seem to be aspirating  when she eats, but  that might be one thing that could be checked as an outpatient to make  sure she is not having chronic aspiration.   DISPOSITION:  [place].   CONDITION AT DISCHARGE:  Stable.  I will discuss this case with her  daughter as soon as I completes the paperwork.  Dr. Tula Nakayama can  follow up with her in the next 2 weeks.      Melissa L. Lovena Le, MD  Electronically Signed     MLT/MEDQ  D:  10/28/2008  T:  10/28/2008  Job:  [id number]   cc:   [name], M.D.  Fax: [phone]

## 2010-08-10 NOTE — Discharge Summary (Signed)
NAMEMarland Rice  [name], [name]                  ACCOUNT NO.:  [id number]   MEDICAL RECORD NO.:  [id number]          PATIENT TYPE:  INP   LOCATION:  [id number]                          FACILITY:  [facility]   PHYSICIAN:  Salem Caster, DO    DATE OF BIRTH:  04/19/1921   DATE OF ADMISSION:  02/11/2007  DATE OF DISCHARGE:  11/18/2008LH                               DISCHARGE SUMMARY   DISCHARGE DIAGNOSES:  1. Failure to thrive.  2. Urinary tract infection.  3. Hypokalemia.  4. Hyperglycemia.  5. History of supraventricular tachycardia.  6. History of pulmonary embolus.  7. History of peripheral vascular disease.   BRIEF HOSPITAL COURSE:  The patient was an [place]  female presented with cough and failure to thrive.  Apparently the  patient was brought to the ER secondary to failure to thrive and not  eating for the past few days.  After talking to the patient, she states  that recently she had one of her friends head diet and she felt  depressed and stopped eating.  The patient started to notice that she  was eating, but the family was concerned and brought her to the  emergency room for evaluation.  The patient admitted to one episode of  dysuria the day of admission also.  Otherwise states that she was  feeling fine.  The first day of being admitted, the patient began to eat  and drink and was doing fairly well.  The patient was found to have a  urinary tract infection, was placed on antibiotics and she has been  doing well.  The patient did have a chest x-ray performed which showed  no active disease.  The patient has not had any complaints of chest  pain, abdominal pain or any other discomfort and at this time feel the  patient is stable enough to be discharged.  She will be sent home on the  following medications.   DISCHARGE MEDICATIONS:  1. Cilostazol 50 mg twice a day.  2. Protonix 40 mg daily.  3. HCTZ 25 mg daily.  4. Aricept 10 mg daily.  5. Pravachol 40 mg at night.  6.  Ferrous sulfate 324 mg twice a day.  7. Synthroid 50 mcg daily.  8. Amitriptyline 10 mg 1/2 tablet every night.  9. Namenda 10 mg twice a day.  10.Coumadin 5 mg 1 tablet daily except on Wednesday when she takes 1/2      tablet.  11.Os-Cal 500 with D daily.  12.K-Dur 20 mEq p.o. daily.   Vitals on discharge:  Temperature is 97.1, pulse 73, respirations 20,  blood pressure 129/73.  Last blood work showed hemoglobin A1c was 7, TSH  1.639.  Urine culture so far was negative.  Blood cultures were  negative.  PTT was 38.  PT 20.5, INR 2.5.  Sodium 41, potassium 3.4,  chloride is 107, CO2 26, glucose 114, BUN 80, creatinine 0.92.  White  count 4.5, hemoglobin 11.1, hematocrit 34.4, platelet count 196,000.   CONDITION ON DISCHARGE:  Stable.   DISPOSITION:  The patient be discharged to home  with instructions to  maintain a low-salt, heart-healthy diet.  Increase activity slowly.  The  patient is to follow-up with Dr. Moshe Cipro in approximately 1 week and she  is told to return to the emergency room or call her primary care  physician if she any major concerns or problems.      Salem Caster, DO  Electronically Signed     SM/MEDQ  D:  02/13/2007  T:  02/13/2007  Job:  [id number]   cc:   [name], M.D.  Fax: [phone]

## 2010-08-10 NOTE — Discharge Summary (Signed)
Brandi Rice, Brandi Rice                  ACCOUNT NO.:  [id number]   MEDICAL RECORD NO.:  [id number]          PATIENT TYPE:  INP   LOCATION:  [id number]                          FACILITY:  [facility]   PHYSICIAN:  Anselmo Pickler, DO    DATE OF BIRTH:  1921-04-29   DATE OF ADMISSION:  02/11/2007  DATE OF DISCHARGE:  11/18/2008LH                               DISCHARGE SUMMARY   ADDENDUM:  I was called by the patient's nurse regarding an episode of  bright red blood per rectum.  A very small and scant amount of blood was  noted on the patient's stool right as she was getting ready to be  discharged.  Daughter was very concerned.  Nurse called me.  I called  Dr. Griffin Dakin office to see if the patient could have close followup  tomorrow.  Then after I spoke with Dr. Moshe Cipro and obtained a copy of  the patient's colonoscopy, I also contacted Dr. Stann Mainland of [place] and spoke with her regarding patient, and she recommended  that the patient follow up with her outpatient for a possible flexible  sigmoidoscopy or colonoscopy.  Called the patient's daughter, obtained  the phone number which was [phone], Valarie Merino, and talked to her  on the phone.  I mentioned to her that when I went to go see the  patient, they had already left after their concern of what they were  going to do with the blood.  I talked to her extensively, stating that I  had spoken with Dr. Moshe Cipro and they have an appointment at 1:30 if they  would like to keep that.  She mentioned they had an appointment at 49,  and I told her she needed to call the office and to find out which one  she wanted to keep.  I spoke her and told her that I talked to Dr. Stann Mainland  regarding her mother's episode of blood, that she recommended that she  follows up with her within 1-2 weeks for possible flexible sigmoidoscopy  or colonoscopy.  I also mentioned to her that if her mother had anymore  episodes of bloody bowel movement, that she would  need to bring her to  the emergency room for evaluation.  The daughter stated understanding.  I told her that Dr. Moshe Cipro is expecting to see her in the morning or  afternoon, which ever appointment she decided upon.  She stated  understanding, and that is when the conversation was ended.      Anselmo Pickler, DO  Electronically Signed     CB/MEDQ  D:  02/13/2007  T:  02/13/2007  Job:  [id number]   cc:   [name], M.D.  Fax: [phone]

## 2010-08-10 NOTE — H&P (Signed)
NAME:  Brandi Rice, Brandi Rice                  ACCOUNT NO.:  [id number]   MEDICAL RECORD NO.:  [id number]          PATIENT TYPE:  INP   LOCATION:  [id number]                          FACILITY:  [facility]   PHYSICIAN:  Salem Caster, DO    DATE OF BIRTH:  July 11, 1921   DATE OF ADMISSION:  02/11/2007  DATE OF DISCHARGE:  LH                              HISTORY & PHYSICAL   PRIMARY CARE PHYSICIAN:  Dr. Tula Nakayama.   CHIEF COMPLAINT:  Failure to thrive, dysuria and cough.   HISTORY OF PRESENT ILLNESS:  This is an 77 year old African American  female who presents with initial complaint of cough and failure to  thrive.  Apparently, the patient was brought in secondary to failure to  thrive and not eating over the past few days.  The patient states that  she recently had a friend that was buried and felt that she was very  depressed and stopped eating for a few days.  The patient states that  she had noticed she was not eating that well, but family seemed very  concerned about this and brought her in for evaluation.  The patient  states also this morning she had an episode of dysuria, but has not had  any other problems prior to today.  The patient states that overall she  feels well, might have had a slight cough, but this has improved.  Upon  being seen in the emergency room, the patient was found to have a  urinary tract infection and it was decided, secondary to her decreased  appetite and dysuria, the patient needed to be admitted.   PAST MEDICAL HISTORY:  1. Hypertension.  2. Goiter.  3. Peripheral vascular disease.  4. Pulmonary embolus.  5. Supraventricular tachycardia.   PAST SURGICAL HISTORY:  Positive for:  1. Left mastectomy.  2. Thyroid surgery.   SOCIAL HISTORY:  Nonsmoker, nondrinker.  No drug abuse.   ALLERGIES:  NO KNOWN DRUG ALLERGIES.   FAMILY HISTORY:  Unremarkable.   HOME MEDICATIONS:  1. Cilostazol 50 mg twice daily.  2. Protonix 40 mg daily.  3. Hydrochlorothiazide 25  mg daily.  4  Aricept 10 mg daily.  1. Pravachol 40 mg daily.  2. Ferrous sulfate 324 mg twice a day.  3. Synthroid 50 mcg daily.  4. Amitriptyline 10 mg one-half tablet daily.  5. Namenda 10 mg twice a day.  6. Coumadin 5 mg one tablet daily and on Wednesday, she takes a half a      tablet.  7. Os-Cal 500 +D mg daily.   REVIEW OF SYSTEMS:  CONSTITUTIONAL:  Positive for decreased appetite,  but denies any chills or fever.  HEENT:  Unremarkable.  RESPIRATORY:  Positive for slight cough.  No dyspnea or shortness of breath.  GASTROINTESTINAL:  No nausea, vomiting or diarrhea.  MUSCULOSKELETAL:  Denies arthralgias, neck or back pain.  NEUROLOGIC:  She did have some  slight confusion, otherwise negative.  All other systems are negative.   PHYSICAL EXAMINATION:  VITAL SIGNS: Temperature 98.6, pulse 79,  respirations 18, blood pressure 157/86 and saturating  97%.  CONSTITUTIONAL:  She was well-developed, well-nourished, well-hydrated,  in no acute distress, alert and oriented x3.  HEENT:  Head is atraumatic  and normocephalic.  No scleral icterus.  PERRL.  EOMI.  NECK:  Soft, supple, nontender and nondistended.  CARDIOVASCULAR:  Distant heart sounds with regular rate and rhythm, no  murmurs or gallops or rubs.  LUNGS:  Clear to auscultation bilaterally.  No rales, rhonchi or  wheezing.  ABDOMEN:  Soft, nontender and nondistended.  Positive bowel sounds.  EXTREMITIES:  No clubbing, cyanosis or edema.  NEUROLOGIC:  Cranial nerves II-XII are grossly intact.  PSYCHIATRIC:  She is alert and oriented to place and time.  Short term  memory seems fairly intact.  She is alert and awake.   LABORATORY DATA:  White count is 12.6, hemoglobin 12.9, hematocrit 40.1,  platelet count 227,000.  Sodium 134, potassium 3.2, chloride 99, CO2 27,  glucose 172, BUN is 8, creatinine 1.09, calcium 3.8.  PT is 27.4.  INR  is 2.4.  Her urine appears hazy, no nitrites, trace leukocyte esterase.  Microscopic urine  showed 11-20 wbc's, many bacteria.   Chest x-ray showed no acute disease and stable.   ASSESSMENT AND PLAN:  1. Failure to thrive I feel may be secondary to some components of      depressive episode.  The patient states that she has been very      depressed, that  friend has passed away and was buried recently.      The patient has had some food since she has been in the hospital.  2. For her urinary tract infection with dysuria, the patient will be      placed on intravenous antibiotics and these will be continued.  We      will await urine culture and sensitivity.  3. For her hypokalemia, we will replace orally and get a recheck in      the morning.  4. For her hyperglycemia, we will get a hemoglobin A1c to rule out any      diabetes at this time.  5. We will empirically place the patient on a sliding scale to control      her elevated blood sugars.     Salem Caster, DO  Electronically Signed    SM/MEDQ  D:  02/12/2007  T:  02/12/2007  Job:  [id number]

## 2010-08-10 NOTE — Op Note (Signed)
NAME:  Brandi Rice, Brandi Rice                  ACCOUNT NO.:  [id number]   MEDICAL RECORD NO.:  [id number]          PATIENT TYPE:  AMB   LOCATION:  [place]                           FACILITY:  [facility]   PHYSICIAN:  Servando Salina, M.D.DATE OF BIRTH:  [birth year]   DATE OF PROCEDURE:  05/17/2007  DATE OF DISCHARGE:                               OPERATIVE REPORT   PREOPERATIVE DIAGNOSIS:  1. Postmenopausal bleeding.  2. Endometrial mass.  3. Endometrial cells on Pap smear.   POSTOPERATIVE DIAGNOSIS:  1. Postmenopausal bleeding.  2. Endometrial mass.  3. Endocervical polyp.   PROCEDURE:  Diagnostic hysteroscopy, hysteroscopic resection of  endometrial masses and endocervical polyps, dilation and curettage.   ANESTHESIA:  MAC.   SURGEON:  Servando Salina, M.D.   INDICATIONS:  An 77 year old, postmenopausal female who was sent by her  primary care physician for evaluation of endometrial cells on Pap smear.  The patient's daughter reported a history of having some vaginal  bleeding in November 2008.  Her evaluation included a sonohysterogram  which showed a 2.7 cm posterior mass suggestive of polyp. The patient  now presents for surgical management.  She has multiple medical problems  for which she is on Coumadin. Surgical risks of the procedure have been  explained to the patient and her daughter and her consent had been  signed. The patient was transferred to the operating room.   PROCEDURE:  Under good monitored anesthesia, the patient is placed in  the dorsolithotomy position.  She was positioned prior to putting the  patient under her anesthesia due to her complaint of back pain.  Once  that was adequately managed, the patient was then  given monitored  anesthesia.  She was then sterilely prepped and draped, the bladder was  catheterized for a small amount of urine.  A bivalve speculum was placed  in the vagina.  The cervix was grasped with a single-tooth tenaculum.  A  paracervical block  was not performed.  There was a polypoid lesion noted  in the cervical os and this was removed.  The cervix was easily dilated  up to a #27 Pratt dilator.  A resectoscope was introduced into the  uterine cavity.  The right tubal ostia could be seen, the left was  sclerosed.  There was evidence of a large polypoid mass projecting from  the fundal area and two other smaller lesions, one in the left lateral  wall and one posteriorly. Some small cystic-appearing areas in the  endometrium was also noted.  Using the resectoscope, the mass was  resected from its base. An initial attempt at grasping it with the polyp  forceps and DeLee clamp was unsuccessful initially. The lateral polypoid  mass was then removed and then the attempt again at removing of the  larger mass was accomplished with it being removed by clamping of it in  the cavity.  The resectoscope was then reinserted, cavity inspected.  No  other lesions were seen and the resectoscope in the process of being  removed noted that there was a polypoid lesion in the endocervical  canal.  This was also carefully resected. The resectoscope was removed,  the cavity was then curetted for a scant amount of tissue. Specimen  labeled  endometrial curettings were all sent to pathology. Estimated blood loss  was minimal. Complications none. Fluid deficit was like 150. The patient  tolerated the procedure well and was transferred to the recovery room in  stable condition.      Servando Salina, M.D.  Electronically Signed     Segundo/MEDQ  D:  05/17/2007  T:  05/18/2007  Job:  [id number]

## 2010-08-10 NOTE — H&P (Signed)
NAME:  Brandi Rice, Brandi Rice                  ACCOUNT NO.:  [id number]   MEDICAL RECORD NO.:  [id number]          PATIENT TYPE:  OBV   LOCATION:  IC01                          FACILITY:  [facility]   PHYSICIAN:  Bonnielee Haff, MD     DATE OF BIRTH:  22-Jan-1922   DATE OF ADMISSION:  01/20/2008  DATE OF DISCHARGE:  LH                              HISTORY & PHYSICAL   PRIMARY CARE PHYSICIAN:  [name], M.D.   CARDIOLOGIST:  Cristopher Estimable. Lattie Haw, MD, [facility]   ADMITTING DIAGNOSES:  1. Slurred speech, possible transient ischemic attack versus stroke.  2. Hypertension.  3. Type 2 diabetes.  4. History of pulmonary embolism on Coumadin.  5. Alzheimer's dementia.  6. Hypothyroidism.  7. Dyslipidemia.   CHIEF COMPLAINT:  Weakness, slurred speech and headache since this  morning.   HISTORY OF PRESENT ILLNESS:  The patient is an 77 year old African-  American female who was in her usual state of health until this morning  when she went to church.  While she was in church, her head started  feeling heavy.  It should be noted that the patient is a poor historian  because of her dementia, so the history if very inaccurate.  In any  case, the head started feeling heavy.  She started hearing a roaring  sound in her head.  She is unable to tell me if the roaring sound was  heard in any one particular site.  She felt hot.  She started sweating.  She felt weak.  She did not experience any facial droop; however, she  did have some slurred speech, according to her daughter.  There was no  focal weakness, and because of these concerning symptoms, the patient  was ultimately brought into the emergency department.   ALLERGIES:  LATEX.   MEDICATIONS:  She is on the following:  1. Os-Cal plus D twice a day.  2. Warfarin 5 mg daily, except on Wednesday when she takes a half      tablet.  3. HCTZ 25 mg daily.  4. Cilostazol 50 mg b.i.d.  5. Klor-Con 10 mEq once daily.  6. Aricept 10 mg once daily.  7.  Amitriptyline 10 mg at bedtime.  8. Levothyroxine 50 mcg daily.  9. Ferrous sulfate 325 mg b.i.d.  10.Metformin 500 mg b.i.d.  11.Pravastatin 40 mg daily.  12.Namenda 10 mg b.i.d.   PAST MEDICAL HISTORY:  1. Goiter requiring surgery.  2. Hypertension.  3. Peripheral vascular disease.  4. PE diagnosed 2 years ago, for which she is still on Coumadin.  5. SVT.  6. Diabetes type 2.  7. She has had ablation for the SVT in the past.  8. She has had breast cancer with a left mastectomy in the past.   SOCIAL HISTORY:  Lives in [place] with her grandson.  No smoking,  alcohol or illicit drug use.  She is usually independent with her daily  activities.  Does not require a cane or walker.   FAMILY HISTORY:  Noncontributory.   REVIEW OF SYSTEMS:  Unable to do because of  her dementia.   PHYSICAL EXAMINATION:  VITAL SIGNS:  Temperature 97.3, blood pressure  140/51, heart rate in the 80s, respiratory rate 20, saturation 95% on  room air.  GENERAL:  An obese elderly black female in no distress.  Quite pleasant  to talk to and confused at times.  HEENT:  There is no pallor, no icterus.  Oral mucous membranes are  moist.  No oral lesions are noted.  NECK:  Soft and supple.  No thyromegaly is appreciated.  LUNGS:  Clear to auscultation bilaterally.  No wheezing, rales, or  rhonchi.  No dullness to percussion.  CARDIOVASCULAR:  S1 and S2, normal rate.  No rubs, no murmurs.  Carotid  bruits are heard bilaterally, more prominent on the right side.  There  is a systolic murmur that is appreciated in the aortic area.  No rubs  are heard.  ABDOMEN:  Soft, nontender, nondistended.  Bowel sounds are present.  No  masses or organomegaly is appreciated.  EXTREMITIES:  No pitting edema.  MUSCULOSKELETAL:  Unremarkable.  NEUROLOGIC:  She is alert, not fully oriented.  Cranial nerve exam did  not show any facial droop.  No other deficits were noted.  Her motor  exam was normal bilaterally, upper and  lower extremities.  Sensory exam  was unremarkable.  Gait was not assessed.  Cerebellar signs were normal.   LABORATORY DATA:  Her CBC is unremarkable.  Her INR is 2.0, glucose 151,  creatinine 1.08.  She had a CT of the head which did not show any acute  abnormalities.  Chest x-ray has not been done.   EKG shows a sinus rhythm with normal axis.  Intervals appear to be in  the normal range.  Possible Q waves in lead III.  No other concerning ST-  T wave changes are noted on this EKG.   ASSESSMENT:  This is an 77 year old African-American female who has a  past medical history as stated above who presents with possible slurred  speech and a roaring sound in her head since being in church earlier  today.  Some of these symptoms are concerning for transient ischemic  attack versus stroke.  Her slurred speech is already better.  She does  have carotid bruits.   PLAN:  1. Possible transient ischemic attack.  Will do an MRI, do      echocardiogram, carotid Dopplers.  Will check homocystine.  PT      evaluation will be provided.  She is on Coumadin and cilostazol.      We will continue with that for now.  Depending on the MRI, we may      have to consider an antiplatelet agent.  She is already on a      statin.  2. Hypertension.  Her medications will continue.  3. Dementia.  4. Type 2 diabetes.  5. History of pulmonary embolism on Coumadin.  6. PT evaluation will be done.   I am anticipating if her neurological neuroimaging studies are within  reasonable limits, she may be able to go home tomorrow evening with  outpatient neurological opinion.      Bonnielee Haff, MD  Electronically Signed     GK/MEDQ  D:  01/20/2008  T:  01/21/2008  Job:  [id number]   cc:   [name], M.D.  Fax: [place] Lattie Haw, [place], [place]  [place], [place] [address]

## 2010-08-13 NOTE — Op Note (Signed)
NAME:  Brandi Rice, Brandi Rice                            ACCOUNT NO.:  [id number]   MEDICAL RECORD NO.:  [id number]                   PATIENT TYPE:  OIB   LOCATION:  2899                                 FACILITY:  [facility]   PHYSICIAN:  Champ Mungo. Lovena Le, M.D.               DATE OF BIRTH:  1922/02/18   DATE OF PROCEDURE:  02/03/2003  DATE OF DISCHARGE:                                 OPERATIVE REPORT   PROCEDURE PERFORMED:  Invasive electrophysiologic study with RF catheter  ablation of AV node reentry tachycardia  with mapping of the patient's  tachycardia, induction of arrhythmia with electrical pacing, program  stimulation and pacing after IV drug use and EP follow up study for efficacy  of therapy.   INTRODUCTION:  The patient is a 77 year old woman with a history of  recurrent tachy palpitations and documented SVT in the past. Despite medical  therapy she is continuing to have symptoms and is now referred for  electrophysiologic study and catheter ablation.   DESCRIPTION OF PROCEDURE:  After informed consent was obtained the patient  was taken to the diagnostic electrophysiology laboratory  in a fasting  state. After the usual preparation and draping, intravenous Fentanyl and  midazolam was given for sedation. A 6 French hexapolar catheter was inserted  percutaneously into the right jugular vein and advanced  to the coronary  sinus.   A 5 French quadripolar catheter was inserted percutaneously into the right  femoral vein and advanced  to the RV apex. A 5 French quadripolar catheter  was inserted percutaneously into the right femoral vein and advanced into  the His bundle region. After measurement of the basic intervals, rapid  ventricular pacing was carried out from the RV apex at a pacing cycle length  of 590 milliseconds and stepwise decreased  down to 350 milliseconds where  VA Wenckebach was observed. During rapid ventricular pacing, atrial  activation sequence was midline  and  decremental.   Next programmed ventricular stimulation was carried out from the RV apex  at  a base adjusted cycle length of 600 milliseconds. The S1-S1 interval was  stepwise decreased  from 540 milliseconds down to 260 milliseconds where  ventricular refractoriness was observed. During programmed ventricular  stimulation from the RV apex, there was no inducible tachycardia, and the  atrial activation  sequence was again midline  and decremental.   Next rapid atrial pacing was carried out from the coronary sinus at a  baseline cycle length of 600 milliseconds and stepwise decreased  down to  510 milliseconds where AV Wenckebach was observed. During rapid atrial  pacing the PR interval  initially was less than the RR interval.   Next programmed atrial stimulation was carried out from the coronary sinus  at the stress cycle length of 600 milliseconds. The S1-S2 interval was  stepwise decreased  from 540 milliseconds down to 420 milliseconds where  echo beats were observed. Continuing to decrement down to 380 milliseconds  resulted in an AH jump. Additional decrements down to 316 milliseconds  demonstrated the AV node ERP. During programmed atrial stimulation there was  no inducible SVT.   Next rapid ventricular pacing was carried out from the RV apex at a pacing  cycle length of 500 milliseconds and stepwise decreased  down to 350  milliseconds where VA Wenckebach was observed. During rapid ventricular  pacing the atrial activation sequence was again midline and decremental.   Next programmed ventricular stimulation was carried out from the RV apex  with a stress cycle length of 600 milliseconds. The S1-S2 interval was  stepwise decreased from 540 milliseconds down to 260 milliseconds where  ventricular refractoriness was observed. During programmed ventricular  stimulation the atrial activation sequence was again midline and  decremental.   At this point isoproterenol was infused at  a rate of 1.3 micrograms per  minute. Rapid atrial pacing was again carried out, demonstrating the  initiation of AV node reentrant tachycardia at a pacing cycle length of 340  milliseconds. During tachycardia the atrial activation sequence was midline  and the VA time was quite short. Premature ventricular complexes placed at  the time  of His bundle refractoriness during tachycardia demonstrated no  atrial preexcitation. In addition during ventricular pacing, the tachycardia  could be terminated. All of the above diagnostic maneuvers demonstrated AV  node reentry tachycardia as the mechanism of the patient's tachycardia  utilizing a slow pathway for antegrade conduction and a fast pathway for  retrograde conduction.   At this point the ablation catheter was maneuvered into the right atrium.  Three RF energy applications were delivered to the slow pathway region,  resulting  in the demonstration of accelerated junctional rhythm. Following  this the patient was observed for 30 minutes. During this time rapid atrial  pacing and programmed atrial stimulation were again carried out during  isoproterenol infusion from the right atrium and this demonstrated no  evidence of inducible SVT and no evidence of any residual slow pathway  conduction.   The catheters were then removed. Hemostasis was assured and the patient was  returned to her room in satisfactory condition.   COMPLICATIONS:  There were no immediate complications.   RESULTS:  1. Baseline ECG:  The baseline ECG demonstrates normal sinus rhythm with     normal axis and intervals. The sinus node cycle length was 878     milliseconds. The AH interval 118 milliseconds. The HB interval 50     milliseconds. QR restoration was 75 milliseconds.  2. Rapid ventricular pacing. Rapid ventricular pacing was carried out from     the RV apex demonstrating a VA Wenckebach cycle length of 350    milliseconds. During rapid ventricular pacing the  atrial activation     sequence was midline and decremental.  3. Deprogrammed ventricular stimulation. Programmed ventricular stimulation     was carried out from the RV apex at a base stress cycle length of 600     milliseconds. The S1-S2 interval was stepwise decreased  down to 260     milliseconds where ventricular refractoriness was observed. During     programmed ventricular stimulation, the atrial activation sequence was     midline and decremental.  4. Programmed atrial stimulation. Programmed atrial stimulation was carried     out from the coronary sinus at a base stress cycle length of 600     milliseconds. The S1-S2 interval  was stepwise decreased  down to 420     milliseconds where an echo beat was observed. Additional decrements down     to 380 milliseconds resulted in an AH jump being demonstrated. At an S1-     S2 coupling interval of 600/360, the AV node ERP was observed.  5. Rapid atrial pacing. Rapid atrial pacing was carried out from both the     coronary sinus and the high right atrium at pacing cycle lengths of 600     milliseconds. The S1-S2 interval was stepwise decreased  down to 510     milliseconds where AV Wenckebach was observed. Following  isoproterenol     initiation, rapid atrial pacing resulted in the initiation of SVT when     pacing was carried out from the high right atrium.  6. Arrhythmias observed. AV node reentrant tachycardia. Initiation rapid     atrial pacing on isoproterenol, duration sustained, cycle length 346     milliseconds. Termination was with ventricular pacing as well as coronary     sinus pacing.  7. Mapping. Mapping of the patient's SVT demonstrated the usual size and     orientation of  Koch's triangle.  1. RF energy application. A total of 3 RF energy applications were delivered     at the slow pathway region. During RF energy application there was     accelerated junctional rhythm. Following  ablation there was no residual     slow  pathway conduction.   CONCLUSION:  This study demonstrates successful electrophysiologic study and  RF catheter ablation of AV node reentrant tachycardia  with 3 RF energy  applications delivered to the slow pathway, resulting in rendering the  tachycardia  noninducible and resulting in rendering of no additional slow  pathway conduction to be present. The patient was observed for 30 minutes.                                               Champ Mungo. Lovena Le, M.D.    GWT/MEDQ  D:  02/03/2003  T:  02/03/2003  Job:  [id number]   cc:   Thomas C. Verl Blalock, M.D.   [name], M.D.  [address]  [place], [place] [address]  Fax: [place], R.N. [facility]

## 2010-08-13 NOTE — Op Note (Signed)
   NAME:  Brandi Rice, Brandi Rice                            ACCOUNT NO.:  [id number]   MEDICAL RECORD NO.:  [id number]                   PATIENT TYPE:  AMB   LOCATION:  DAY                                  FACILITY:  [facility]   PHYSICIAN:  Leane Para C. Tamala Julian, M.D.                DATE OF BIRTH:  1921/08/22   DATE OF PROCEDURE:  DATE OF DISCHARGE:                                 OPERATIVE REPORT   PREOPERATIVE DIAGNOSIS:  Left breast mass.   POSTOPERATIVE DIAGNOSIS:  Invasive carcinoma left breast.   PROCEDURE:  Left partial mastectomy.   SURGEON:  Vernon Prey. Tamala Julian, M.D.   DESCRIPTION OF PROCEDURE:  Under general LMA anesthesia the left breast was  prepped and draped in a sterile field.  She had had previous needle  localization of the mass.  A supra-areolar incision was made surrounding the  needle.  Using electrocautery the tissue surrounding the needle was excised  down past the endpoint of the needle.  It was sent for radiographic  evaluation and for frozen section. The deep tissues were closed with 2-0 and  3-0 Biosyn.  Subcutaneous tissue was closed with 3-0 Biosyn.  The skin was  closed with 4-0 Dexon.  Sterile dressing was placed.   The patient was awakened from anesthesia uneventfully, transferred to a bed,  and taken to the postanesthetic care unit.  At the end of the procedure  frozen section diagnosis revealed invasive carcinoma left breast.                                               Leane Para C. Tamala Julian, M.D.    LCS/MEDQ  D:  12/11/2001  T:  12/11/2001  Job:  [id number]

## 2010-08-13 NOTE — H&P (Signed)
   NAME:  Brandi Rice, Brandi Rice                            ACCOUNT NO.:  [id number]   MEDICAL RECORD NO.:  [id number]                   PATIENT TYPE:  AMB   LOCATION:  DAY                                  FACILITY:  [facility]   PHYSICIAN:  Leane Para C. Tamala Julian, M.D.                DATE OF BIRTH:  [birth year]   DATE OF ADMISSION:  DATE OF DISCHARGE:                                HISTORY & PHYSICAL   HISTORY OF PRESENT ILLNESS:  [age]-year-old female with a history of  an abnormal mammogram with microcalcifications; these are new findings since  her last mammogram.  The mammogram shows an irregular density with  calcifications in the left inner quadrant of her breast.  Changes are felt  to be suspicious for carcinoma.  The patient is scheduled to have needle  localization and partial mastectomy.   PAST HISTORY:  Positive for hypertension, hyperlipidemia, hypothyroidism,  osteoarthritis, history of supraventricular tachycardia.   MEDICATIONS:  1. Verapamil 240 mg q.d.  2. Hydrochlorothiazide 12.5 mg q.d.  3. Aspirin 81 mg q.d.  4. Pravachol 40 mg q.d.  5. Xanax 0.25 mg one-half b.i.d.  6. Levoxyl 0.05 mg q.d.   FAMILY HISTORY:  Family history is positive for cerebral aneurysm and heart  attack.   PREVIOUS SURGERY:  Partial thyroidectomy and left partial mastectomy.   PHYSICAL EXAMINATION:  VITAL SIGNS:  Blood pressure 130/68, pulse 78,  respirations 18.  Weight 196 pounds.  HEENT:  Unremarkable.  NECK:  Supple.  No JVD or bruit.  CHEST:  Clear to auscultation.  HEART:  Regular rate and rhythm without murmur, gallop or rub.  ABDOMEN:  Soft, nontender.  No masses.  BREASTS:  Nontender.  No masses.  No skin changes or nipple changes.  Axillae normal.  No adenopathy felt.  ABDOMEN:  Soft and nontender.  No masses.  Normal bowel sounds.  EXTREMITIES:  No cyanosis, clubbing or edema.  NEUROLOGIC:  Exam nonfocal.   IMPRESSION:  1. Left breast mass with suspicious characteristics  mammographically.  2. Hypertension.  3. Hypothyroidism.  4.     Osteoarthritis.  5. History of supraventricular tachycardia.   PLAN:  Left partial mastectomy after needle localization.                                               Vernon Prey. Tamala Julian, M.D.    LCS/MEDQ  D:  12/11/2001  T:  12/11/2001  Job:  [id number]

## 2010-08-13 NOTE — Procedures (Signed)
   NAME:  Brandi Rice, Brandi Rice                              ACCOUNT NO.:  [id number]   MEDICAL RECORD NO.:  [id number]                   PATIENT TYPE:   LOCATION:                                       FACILITY:   PHYSICIAN:  Cristopher Estimable. Lattie Haw, M.D. [facility]        DATE OF BIRTH:   DATE OF PROCEDURE:  DATE OF DISCHARGE:                                  ECHOCARDIOGRAM   CLINICAL DATA:  The patient is a 77 year old woman with atrial fibrillation,  diagnosed on admission.   INTERPRETATION:  1. Technically adequate echocardiographic study.  2. Normal left atrium, right atrium, and right ventricle.  3. Mild aortic valvular sclerosis.  4. Normal mitral valve with trivial regurgitation.  5. Normal tricuspid valve with physiologic regurgitation.  6. Normal estimated RV systolic pressure.  7. Normal internal dimension of the left ventricle; borderline hypertrophy;     most prominent in the proximal septum.  8. Normal regional and global LV systolic function.  9. Normal IVC.                                               Cristopher Estimable. Lattie Haw, M.D. [facility]    RMR/MEDQ  D:  11/15/2001  T:  11/16/2001  Job:  [id number]   cc:   Barrie Folk. Berdine Addison, M.D.

## 2010-08-13 NOTE — Group Therapy Note (Signed)
NAME:  Brandi Rice, Brandi Rice                  ACCOUNT NO.:  [id number]   MEDICAL RECORD NO.:  [id number]          PATIENT TYPE:  INP   LOCATION:  A218                          FACILITY:  [facility]   PHYSICIAN:  Edward L. Luan Pulling, M.D.DATE OF BIRTH:  [birth year]   DATE OF PROCEDURE:  02/10/2005  DATE OF DISCHARGE:                                   PROGRESS NOTE   SUBJECTIVE:  Brandi Rice is overall doing about the same. She has no new  complaints.   OBJECTIVE:  VITAL SIGNS:  Temperature 98.5, pulse 79, respirations 16, blood  pressure 106/57, O2 sat 99%. Prothrombin time 14.8 with an INR 1.1.   ASSESSMENT:  She is going to have to be anticoagulated. We are still  awaiting the other labs to help to determine how long she will need to be  anticoagulated.      Edward L. Luan Pulling, M.D.  Electronically Signed     ELH/MEDQ  D:  02/10/2005  T:  02/10/2005  Job:  [id number]

## 2010-08-13 NOTE — H&P (Signed)
NAME:  Brandi Rice, Brandi Rice                            ACCOUNT NO.:  [id number]   MEDICAL RECORD NO.:  [id number]                   PATIENT TYPE:  OBV   LOCATION:  IC10                                 FACILITY:  [facility]   PHYSICIAN:  Barrie Folk. Berdine Addison, M.D.                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   CHIEF COMPLAINT:  Recurrent spell of nervousness secondary to fast heart  beat in chest starting around 4 p.m. on the day of admission.   HISTORY OF PRESENT ILLNESS:  The patient went to bed because of fatigue  associated with nervous spell of fast heart rate.  Because of persistent  palpitations she contacted her neighbor to be transferred to the emergency  room at [facility].  She has been experiencing these spells of  palpitation, fatigue, and weakness, off-and-on over 3 years.   Medical history is positive for hypertension, hyperlipidemia,  hypothyroidism, and osteoarthritis.  Medical history is negative for  diabetes, cancer, sickle cell, asthma, and seizure disorder.  The patient  denied experiencing chest pain, shortness of breath, sweating spell, arm  pain, jaw pain, and syncope.   In the emergency room she was treated with Cardizem bolus of 18 mg, oxygen,  and other supportive therapy.  The bolus stopped at 18 mg because the  patient reverted to normal sinus rhythm.  The patient indicated to the ER  physician that she felt much better.   Cardiac enzymes demonstrated the following in the emergency room: Total CPK  177, CK/MB 2.0, troponin I 0.01.   MEDICATIONS:  1. Monopril 10 mg p.o. every day.  2. One coated aspirin p.o. every day.  3. Hydrochlorothiazide 25 mg p.o. every day.  4. Levoxyl 0.05 mg p.o.  every day.  5. Pletal 50 mg p.o. b.i.d.  6. Pravachol 40 mg p.o. every day.  7. Klor-Con 10 mEq p.o. every day.   ALLERGIES:  The patient is not allergic to any known medications.   SOCIAL HISTORY:  Habits are positive for former cigarettes x20 years; and  the former use of ethanol x30 years.   REVIEW OF SYSTEMS:  Positive for episodic right knee pain and stiffness.  Review of systems negative for epistaxis, dysphagia, hemoptysis, wheezing,  chronic cough, hematemesis, dysuria, gross hematuria, melena, diarrhea,  constipation, unexplained fever, or significant weight loss.   FAMILY HISTORY:  Mother deceased in her 74s secondary to natural causes;  father deceased at age 74 secondary to natural causes; 3 sisters living,  age 27 -- good health; age 76 -- history of cerebral aneurysm; age 56 --  history of heart attack; 3  sisters deceased, one in her 29s secondary to  heart attack; one in her 31s secondary to complications of child birth; and  one in her 20s secondary to complications of alcohol abuse; 5 brothers  deceased cause unknown; 1 daughter living age 56 -- good health.   PAST MEDICAL HISTORY:  Past medical history positive for hospitalization  with palpitations; hospitalization for thyroidectomy at [facility] in [place], [place] in [place] for goiter; vaginal  polypectomy by Dr. Isaiah Blakes in the 1990s at [facility]; diagnostic  left breast biopsy which was benign by Dr. Mohammed Kindle.   PHYSICAL EXAMINATION:   VITAL SIGNS:  Vitals in the emergency room, temperature 98.5, pulse 93,  respirations 20, blood pressure 108/88.   GENERAL APPEARANCE:  Reveals an elderly, medium-height, large-framed, alert,  black female who appeared anxious but in no  respiratory distress.   HEAD:  Normocephalic.   EARS:  Normal auricles.  External canals patent.  Tympanic membranes pearly  gray.   EYES:  Lids negative for ptosis.  Sclerae white.  Pupils round,  equal, and  reactive to light.  Extraocular movements intact.   NOSE:  Negative for discharge.   MOUTH:  Positive for upper and lower dentures.  Posterior pharynx benign.   NECK:  Positive for old healed horizontal surgical scar.  No thyromegaly, no  adenopathy.  No jugular vein distention.   LUNGS:  Clear.  Supraclavicular space no palpable nodes.   HEART:  Audible S1, S2, rate 150s.   BREASTS:  Positive for old healed surgical scar involving the left breast,  lower outer quadrant.  No palpable mass, no skin changes.  Nipples erect  without discharge.   ABDOMEN:  Obese, hyperactive bowel sounds.  Soft, nontender in all 4  quadrants.  No palpable mass, no organomegaly.   PELVIC:  Deferred.   RECTAL:  Deferred.   EXTREMITIES:  Knees: Positive crepitus and stiffness (right greater than  left).  Tibia: Trace of edema.  Feet: Palpable dorsalis pedis bilaterally.   NEUROLOGIC:  Alert and oriented to person, place and time.  Cranial nerves  II-XII appeared intact.   X-RAY AND LABORATORY DATA:  Other significant labs on admission were as  follows:  White count 4.0, hemoglobin 11.5, hematocrit 25.5, platelets  247,000.  Prothrombin time 13.0, INR of 1.1.  Positive thromboplastin time  24.  Sodium 141, potassium 4.8, chloride 107, CO2 31, glucose 148, BUN 11,  creatinine 1.2, calcium 8.9, total protein 6.6, albumin 3.7, SGOT 23, SGPT  12, alkaline phosphatase 88, total bilirubin 0.7.   PRIMARY IMPRESSION:  Paroxysmal supraventricular tachycardia.   SECONDARY DIAGNOSES:  1. Hypertension.  2. Hyperlipidemia  3. Hypothyroidism.  4. Osteoarthritis.   PLAN:  Give Cardizem initially 10 mg IV to see if conversion will take place  (conversion did take place).  Move patient to ICU.  Start on p.o. Cardizem  30 mg p.o. q.6h., continue IV normal saline at 30 cc/h, oxygen at 2  liters/min.  Activity: Bed rest x12 hours.  Other medications: Hydrochlorothiazide 25 mg p.o. every day,  potassium 20 mEq p.o. every day,  aspirin 81 mg p.o. every day, Pravachol 40 mg p.o. at bedtime, Levoxyl 0.05  mg p.o. every day.  Fasting lipid profile, TSH, magnesium, MET-7 every  morning x3.  Diet: 4 gm sodium, low cholesterol.  Cardiology consult, repeat  cardiac enzymes q.8h. x2  Barrie Folk. Berdine Addison, M.D.    Armanda Heritage  D:  11/14/2001  T:  11/14/2001  Job:  [id number]

## 2010-08-13 NOTE — Letter (Signed)
November 16, 2005     [name], [place] [address]  [place], [place] [address]   RE:  ZYAIRE, MIETUS  MRN:  [id number]  /  DOB:  Aug 24, 1921   Dear Joycelyn Schmid:   It was my pleasure to once again see Ms. Troxler.  I previously evaluated her  in 2003 for paroxysmal supraventricular tachycardia.  She eventually  underwent radiofrequency ablation and has been free of any arrhythmia since  that time.  Unfortunately, she developed unprovoked pulmonary embolism just  less than a year ago.  She has been maintained on warfarin without problems  since.  She has hypertension that has been very well-controlled.  She is  said to have peripheral vascular disease, but her problems seem to be more  leg cramps than claudication.   Current medications include:  1. Pravastatin 40 mg daily.  2. Pletal 50 mg b.i.d.  3. Hydrochlorothiazide 25 mg daily.  4. Levothyroxine 0.5 mg daily.  5. Warfarin as directed.  6. Aricept 10 mg daily.  7. Protonix 40 mg daily.  8. Ferrous sulfate 325 mg b.i.d.  9. Amitriptyline 10 mg q.h.s.   PHYSICAL EXAMINATION:  GENERAL:  On exam, a very boisterous and pleasant  older woman in no acute distress.  VITAL SIGNS:  The weight is 207, 2 pounds less than in 2004, blood pressure  120/70, heart rate 75 and regular, respirations 16.  NECK:  No jugular venous distention; normal carotid upstrokes without  bruits.  LUNGS:  Clear.  CARDIAC:  Normal first and second heart sounds; fourth heart sound present;  normal PMI.  ABDOMEN:  Soft and nontender; no organomegaly.  EXTREMITIES:  Distal pulses intact; trace edema.   IMPRESSION:  Ms. Boelman is doing well from a cardiovascular standpoint.  I  agree with Dr. Luan Pulling' initial impression that continuing anticoagulation  is desirable.  Influenza vaccine and pneumococcal vaccine were recommended  for the patient; her daughter is not inclined for her to receive those  treatments.  We will check stool for Hemoccult.   She has been treated with  iron since her pulmonary embolism 8 months ago but now has a normal CBC.  She probably does not need to take this supplement.  You may also wish to  consider whether Pletal is really of benefit in her.  She has good distal  pulses and thus probably does not have claudication.  I will leave  monitoring of electrolytes and CBC to your discretion and plan to see this  nice woman in 1 year.  She will be followed in the anticoagulation clinic in  the interim.    Sincerely,      Cristopher Estimable. Lattie Haw, MD, [facility]   RMR/MedQ  DD:  11/16/2005  DT:  11/17/2005  Job #:  [id number]

## 2010-08-13 NOTE — Consult Note (Signed)
NAME:  Brandi Rice, Brandi Rice                  ACCOUNT NO.:  [id number]   MEDICAL RECORD NO.:  [id number]          PATIENT TYPE:  INP   LOCATION:  A218                          FACILITY:  [facility]   PHYSICIAN:  Edward L. Luan Pulling, M.D.DATE OF BIRTH:  1922/01/02   DATE OF CONSULTATION:  DATE OF DISCHARGE:                                   CONSULTATION   REASON FOR CONSULTATION:  Pulmonary embolism.   HISTORY:  Ms. Kornbluth is an 77 year old African-American female patient of Dr.  Moshe Cipro, admitted by Dr. Megan Salon because of pulmonary embolism.  She has a  history of hypertension, hyperlipidemia, hypothyroidism, and a mastectomy of  the left breast which was done about two years ago.  She said she was in her  normal state of health, woke up on the morning of admission with a feeling  of band tightening around her head.  She then had a lump in her left upper  chest and felt like all of the air was coming out of her lungs.  She was  brought to the emergency room, where she was found to have multiple  bilateral pulmonary emboli by CT scan.  She says that she has been very  active.  She has not had any history of prolonged sitting except with a car  trip.  She does not have a history of any sort of clotting problems, as far  is as known.  In the past, when she has had surgery, she has had no clotting  difficulties.  She has had a history of supraventricular tachycardia that  was treated with ablation.   PAST MEDICAL HISTORY:  As mentioned, positive for hypertension,  hypothyroidism, mastectomy, hyperlipidemia, and supraventricular  tachycardia.   MEDICATIONS ON ADMISSION:  1.  Pletal 50 mg b.i.d.  2.  Aspirin 81 mg daily.  3.  Pravachol 40 mg at bedtime.  4.  Levothyroxine 50 mcg daily.  5.  HCTZ 25 mg daily.  6.  Xanax as needed.   She has no known drug allergies.   SOCIAL HISTORY:  She is widowed.  She has about a 60 pack year smoking  history but stopped in 1971.  She does not use alcohol.   FAMILY HISTORY:  Apparently is positive for hypertension.   REVIEW OF SYSTEMS:  Otherwise pretty much negative.  She says that she does  get short of breath when she takes her oxygen off.   PHYSICAL EXAMINATION:  GENERAL:  A well-developed and well-nourished female  who is in no acute distress now.  She is alert and oriented.  VITAL SIGNS:  Temperature 98.1, pulse 77, respirations 20, blood pressure  115/75.  O2 sat is 98% on 2 liters.  Height 69 inches.  Weight 201.  HEENT:  Pupils are reactive to light and accommodation.  Mucous membranes  are moist.  CHEST:  Actually very clear.  HEART:  Regular without gallop.  ABDOMEN:  Soft and nontender.  EXTREMITIES:  No edema but she does have some tenderness in the left calf  with deep palpation.  NEUROLOGIC:  She is intact.  LABORATORY:  Per Dr. Hale Bogus note.  Her D-dimer is noted as 3.3.   CT shows bilateral pulmonary emboli, a substernal thyroid, some small  nodules in the lower part of her lung, which I believe are probably scarred.  Not metastatic disease.   I suspect that the precipitating cause may have been her car trip, but that  is certainly not a strong risk factor, and she very well may require  lifetime anticoagulation.   Thanks for allowing me to see her with you.      Edward L. Luan Pulling, M.D.  Electronically Signed     ELH/MEDQ  D:  02/09/2005  T:  02/09/2005  Job:  [id number]

## 2010-08-13 NOTE — Group Therapy Note (Signed)
NAME:  Brandi Rice, Brandi Rice                  ACCOUNT NO.:  [id number]   MEDICAL RECORD NO.:  [id number]          PATIENT TYPE:  INP   LOCATION:  A218                          FACILITY:  [facility]   PHYSICIAN:  Edward L. Luan Pulling, M.D.DATE OF BIRTH:  08/15/1921   DATE OF PROCEDURE:  02/12/2005  DATE OF DISCHARGE:                                   PROGRESS NOTE   PROBLEM:  Pulmonary embolus.   SUBJECTIVE:  Brandi Rice says she is feeling pretty well.  She has no new  complaints.   OBJECTIVE:  GENERAL:  Her physical exam shows her she is awake and alert.  VITAL SIGNS:  Temperature is 97.7, pulse 78, respirations 16, blood pressure  140/70, O2 saturations 97% on 2 L.  CHEST:  Her chest is clear.  She looks comfortable.   Her white count 5700, hemoglobin 11.8.  Prothrombin time 14.5 with an INR of  1.1.  Electrolytes with sodium 134, otherwise normal.   ASSESSMENT:  She seems to be doing better.   PLAN:  She is trying to get up and move around a bit more.  I think that is  perfectly reasonable and will plan to continue with all of her other  treatments in the meantime.      Edward L. Luan Pulling, M.D.  Electronically Signed     ELH/MEDQ  D:  02/12/2005  T:  02/12/2005  Job:  [id number]

## 2010-08-13 NOTE — H&P (Signed)
NAME:  Brandi Rice, Brandi Rice                  ACCOUNT NO.:  [id number]   MEDICAL RECORD NO.:  [id number]          PATIENT TYPE:  INP   LOCATION:  A218                          FACILITY:  [facility]   PHYSICIAN:  Karlyn Agee, M.D. DATE OF BIRTH:  1922-03-12   DATE OF ADMISSION:  02/08/2005  DATE OF DISCHARGE:  LH                                HISTORY & PHYSICAL   CHIEF COMPLAINT:  Shortness of breath.   HISTORY OF PRESENT ILLNESS:  This is an 77 year old African American lady  with a history of hypertension and hypothyroidism, history of mastectomy  about two years ago for early cancer of the left breast, who was in a good  state of health, ambulating without difficulty until three days ago when she  felt a strange sensation like a band tightening around her head.  The  patient did not take any remedy for this, although she had felt very  strange.  But, this morning she had a sudden sensation of a lump in the left  upper chest and felt as if all the air was rushing out of her lungs.  She  came to the emergency room where workup including CT scan of the chest was  done and revealed multiple bilateral pulmonary emboli.  The patient denies  any prolonged sitting, standing, or laying and feels very active.  About a  week ago, she did go on a long distance journey to [place] by car of which  she says was no more than three and a half hours each way.  No family  history of clotting disorder.  Had no fever, cough, cold, or chest pain,  shortness of breath, or syncope.  No history of stroke.  She does have a  history of SVT that was treated by ablation in October 2004.   PAST MEDICAL HISTORY:  1.  Hypertension.  2.  Hypothyroidism status post partial thyroidectomy.  3.  History of left mastectomy in September 2004 by Dr. Irving Shows.  4.  Hypercholesterolemia.  5.  History of supraventricular tachycardia status post ablation two years      ago.   MEDICATIONS:  1.  Xanax 0.5 mg at bedtime when  necessary.  2.  Hydrochlorothiazide 25 mg daily.  3.  Levothyroxine 50 mcg daily.  4.  Pravastatin 40 mg at bedtime.  5.  Aspirin 81 mg daily.  6.  Pletal 50 mg twice daily.   ALLERGIES:  No known drug allergies.  She specifically denies a history of  Latex allergy which is listed elsewhere in her chart.   SOCIAL HISTORY:  She is widowed for the past 20 years.  Grandson lives with  her.  She is retired from [employer] and smoked heavily until  1971.  No history of alcohol or illicit drug use.   FAMILY HISTORY:  Significant only for hypertension.   REVIEW OF SYSTEMS:  A 10-point review of systems other than noted above is  essentially negative.   PHYSICAL EXAMINATION:  GENERAL:  Very pleasant, elderly, African American  lady laying in bed in no acute distress  at this time.  She is wearing  oxygen.  VITAL SIGNS:  Her temperature is 97.8, pulse 83, respirations 20, blood  pressure of 131/79.  She was saturating at 86% on room air.  She is now 94%  on 2 L.  HEENT:  Pupils are round, equal, and reactive.  Mucous membranes are pink  and anicteric.  CHEST:  Clear to auscultation bilaterally.  CARDIOVASCULAR:  Regular rhythm.  ABDOMEN:  Obese, soft, and nontender.  EXTREMITIES:  Without edema.  PSYCHIATRIC:  She is alert and oriented x3.  CENTRAL NERVOUS SYSTEM:  No cranial nerves deficits.  No focal neurologic  deficits.   LABORATORY STUDIES:  White count 4.4, hemoglobin 12.2, platelets 205.  Her  sodium is 138, potassium 3.4, CO2 26, glucose 136, BUN 8, creatinine 1.  D-  dimer was 3.3.  PT is 13.8, INR is 1.  ABG on 3 L shows 7.425, PCO2 of 42.8,  PO2 of 86.  Saturating at 95%.  EKG shows a normal sinus rhythm with  vertical axis and nonspecific anterolateral T abnormalities.   ASSESSMENT:  1.  Acute bilateral pulmonary embolus in a lady with past history of early      breast cancer.  2.  Mild hypokalemia.  3.  Hypertension, controlled.  4.  Hyperglycemia.  5.   History of hypothyroidism.  6.  Possible peripheral vascular disease.  7.  History of supraventricular tachycardia status post ablation two years      ago.   PLAN:  We will admit this lady on PE protocol as she is not in acute  distress at this time.  We will start her on Lovenox and also start Coumadin  later day.  Aim for discharge in a couple of days if she learns to give  herself  Lovenox at home.  We will replete her serum potassium.  We will give her 12  hours of hydration since she has been exposed to contrast.  We will discuss  her case with Dr. Tamala Julian to see if she warrants lifetime anticoagulation or  lifetime Lovenox depending on the type of cancer she had.  We will also do  lower extremity Dopplers.      Karlyn Agee, M.D.  Electronically Signed     Karlyn Agee, M.D.  Electronically Signed    LC/MEDQ  D:  02/08/2005  T:  02/08/2005  Job:  [id number]   cc:   [name], M.D.  Fax: [place] Tamala Julian, M.D.  Fax: [phone]

## 2010-08-13 NOTE — Discharge Summary (Signed)
NAME:  Brandi Rice, Brandi Rice                  ACCOUNT NO.:  [id number]   MEDICAL RECORD NO.:  [id number]          PATIENT TYPE:  INP   LOCATION:  A218                          FACILITY:  [facility]   PHYSICIAN:  Bonnielee Haff, MD     DATE OF BIRTH:  1921-06-23   DATE OF ADMISSION:  02/08/2005  DATE OF DISCHARGE:  11/20/2006LH                                 DISCHARGE SUMMARY   The patient's primary medical doctor is Dr. Tula Nakayama.   DISCHARGE DIAGNOSES:  1.  Acute pulmonary embolus, etiology unclear.  2.  Enlarged thyroid with a possible dominant mass in the inferior pole of      the left thyroid lobe.  3.  Hypertension, stable.  4.  Peripheral vascular disease, stable.  5.  Lung nodules, thought to be secondary to scarring.   HISTORY AND PHYSICAL:  Please review history and physical dictated at the  time of admission for details regarding the patient's presenting illness.   HOSPITAL COURSE:  1.  Acute PE.  Briefly, this is an 77 year old elderly African-American      female who presented to the ED with shortness of breath.  She has a      history of left breast cancer for which she had had mastectomy two years      ago.  In the emergency room, the patient had a CAT scan of the chest      which revealed multiple bilateral pulmonary emboli.  The patient did      mention a three and a half hour continuous care drive about two weeks      prior to this admission.  Apart from this history and the history of      breast cancer, no other risk factors are identified.   The patient was consulted upon by Dr. Luan Pulling who felt that the patient  would need life-long anticoagulation because of her history of breast  cancer.  The patient was also incidentally found to have two small right  lung nodules which were thought to be secondary to scarring and secondary to  non-calcified granulomas.  Dr. Luan Pulling also felt that no further evaluation  was required for the same.   The patient had been put on  Lovenox.  Her Coumadin was started late because  of a medication error.  However, it has been started.  Her INR is 1.4 today.  Her PT is slowly increasing.  She will need five more days of Lovenox.  We  will have her PT/INR checked on 11/23 by home health.   She also had a lower extremity venous Doppler study which did not show any  deep vein thrombosis.   Problem 2. Enlarged thyroid.  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  As a followup to this finding, an ultrasound of the neck was  ordered which showed a multinodular enlarged thyroid gland with a dominant  mass inferiorly in the left thyroid lobe. Dr. Megan Salon discussed this case  with endocrinologist who suggested that the patient may need to have a  tissue biopsy, however, could wait three months because of the  anticoagulation issue.  This will be communicated to the patient's primary  doctor.   Problem 3.  The patient's other medical issues including hypertension and  peripheral vascular disease have remained stable.  She is found to be mildly  anemic during this admission, for which she was started on iron sulfate.  Iron studies suggested possible iron-deficiency.   DISCHARGE MEDICATIONS:  1.  Coumadin 5 mg p.o. once daily at bedtime.  2.  Protonix 40 mg p.o. once daily.  3.  Lovenox 90 mg subcutaneously q.12h. for five days.  4.  Ferrous sulfate 325 mg p.o. b.i.d.  5.  The patient is to continue on her hydrochlorothiazide, Synthroid, and      Pravastatin.   She has been asked to discontinue her aspirin and Pletal at this time.   The patient has been instructed to avoid Motrin and other NSAIDs.  She has  been told that she could take Tylenol if required.   The patient has also been explained the side effects, including bleeding, of  Coumadin.  She has been told to be extremely careful and to avoid any falls.  The patient has  been ambulating in the hospital with no difficulties.   DIET:  Low salt diet.   PHYSICAL ACTIVITY:  No restrictions.   IMAGING STUDIES:  Imaging studies done include:  1.  Lower extremity Dopplers.  2.  Ultrasound of the neck for thyroid study.  3.  CT chest with contrast.  All these three have been discussed above.   She also had an x-ray of her chest which showed cardiomegaly and probable  COPD with questionable pulmonary arterial hypertension.   CONSULTATIONS:  Consultation was obtained from Dr. Luan Pulling who felt that the  patient may need life-long anticoagulation because of her history of breast  cancer.   DISCHARGE INSTRUCTIONS:  1.  Followup care with Dr. Tula Nakayama in one week's time.  2.  PT/INR to be checked by home health on Thursday, 11/23.  Results to be      called to Dr. Griffin Dakin office and adjustments to be made to Coumadin.      Bonnielee Haff, MD  Electronically Signed     GK/MEDQ  D:  02/14/2005  T:  02/14/2005  Job:  [id number]   cc:   [name], M.D.  Fax: [phone]

## 2010-08-13 NOTE — Discharge Summary (Signed)
NAME:  Brandi Rice, Brandi Rice                            ACCOUNT NO.:  [id number]   MEDICAL RECORD NO.:  [id number]                   PATIENT TYPE:  INP   LOCATION:  A225                                 FACILITY:  [facility]   PHYSICIAN:  Barrie Folk. Berdine Addison, M.D.                DATE OF BIRTH:  09-04-1921   DATE OF ADMISSION:  11/14/2001  DATE OF DISCHARGE:  11/16/2001                                 DISCHARGE SUMMARY   PATIENT IDENTIFICATION:  The patient was a 77 year old widowed gravida 1  para 1 AB 0 retired [employer] worker black female from [place],  [place].   CHIEF COMPLAINT:  Recurrent spell of nervousness secondary to fast heartbeat  in chest starting around 4 p.m. on the day of admission.  The patient went  to bed because of fatigue associated with nervous spell of fast heart rate.  The patient contacted her neighbor to be transferred to the emergency room  due to the persistent palpitation.  She also experienced these spells of  palpitation with fatigue and weakness off and on over three years.   MEDICAL HISTORY:  Positive for hypertension, hyperlipidemia, hypothyroidism,  and osteoarthritis.   ALLERGIES:  The patient was not allergic to any known medication.   HABITS:  Positive for former cigarette smoker x20 years and former use of  ethanol x30 years.   FAMILY HISTORY:  Revealed mother deceased in her 54s secondary to natural  causes; father deceased at age 25 secondary to natural causes.  Three  sisters living - age 58, good health; age 63 with history of cerebral  aneurysm, age 78 with history of heart attack; three sisters deceased - one  in her 53s secondary to heart attack, one in her 61s secondary to  complications of childbirth, and one in her 24s secondary to complications  of alcohol abuse.  Five brothers deceased - cause unknown.  One daughter  living, age 49, good health.   PAST MEDICAL HISTORY:  Positive for hospitalization for palpitations in  past;  hospitalization for thyroidectomy at [facility] in  [place], [place] in 1968 for goiter; vaginal polypectomy by Dr. Gareth Eagle  in the 1990s at [facility];  diagnostic left breast biopsy which  was benign by Dr. Mohammed Kindle.   HOSPITAL COURSE:  1. Periexertional supraventricular tachycardia.  Vital signs in the     emergency room at the time of her admission were:  Temperature 98.5,     pulse 93, respirations 20, blood pressure 108/88.  General appearance     revealed an elderly, medium-height, large-framed, alert black female who     appeared anxious but no apparent respiratory distress.  Skin was warm and     dry.  Lungs were clear.  Heart revealed audible S1 and S2 with rate in     the 150s.  Extremities demonstrated trace pretibial edema.  Neurologically the patient was intact.  In the emergency room at time of     admission she was treated with a bolus of Cardizem 18 mg, oxygen, and     other supportive.  The bolus was stopped at 18 mg because the patient     converted to a normal sinus rhythm.  The patient indicated to the     emergency room physician at that time that she felt much better.  While     on the floor she experienced recurrent episode of nervousness in chest     which was due to a fast heart rate of 150s.  Again she was given Cardizem     bolus of 10 mg and she converted to a sinus rhythm.  The patient was also     given at this time Cardizem 30 mg p.o. and transferred to ICU for     overnight stay.  She was continued on oxygen at 2 liters/minute via nasal     cannula, IV normal saline, and bedrest.  The patient's hospital course     was uneventful.  A cardiology consult was done by Dr. Jacqulyn Ducking on     November 15, 2001.  He felt that Cardizem needed to be changed to verapamil     because of her sinus bradycardia after receiving it.  Therefore, the     patient was started on verapamil 240 mg p.o. q.d.  Also, he decreased her      diuretic during this hospitalization.  The patient will be discharged to     her home on November 16, 2001.  She will follow up with her family doctor.  2. Hypertension.  Blood pressure on admission was 108/88.  Significant labs     on admission were:  White count 4.0; hemoglobin 11.5; hematocrit 35.5;     platelets 247,000.  Sodium 141, potassium 4.8, chloride 107, CO2 31, BUN     11, creatinine 1.2, calcium 8.9, total protein 6.6, albumin 3.7, AST 23,     ALT 12, ALP 88, total bilirubin 0.7, magnesium 1.6.  The patient was     treated with a low sodium diet, initially hydrochlorothiazide 25 mg p.o.     q.d., calcium channel blocker, and other supportive measures.  Blood     pressure has been controlled within this hospitalization.  The patient     has not complained of chest pain, palpitations, shortness of breath, or     nervous feeling at time of discharge.  3. Hyperlipidemia.  The patient has been treated with Pravachol 40 mg p.o.     q.d. as well as one aspirin (81 mg) p.o. q.d.  Fasting lipid profile is     pending at time of discharge.  The patient will be discharged home on     cholesterol medication.  4. Hypothyroidism.  Examination of neck demonstrated old healed surgical     scar and no thyromegaly.  Thyroid laboratory is pending at time of this     dictation; they will be followed up.  The patient was continued on     Levoxyl 0.05 mg p.o. q.d.  Heart rate is within normal limit at time of     discharge.  The patient is not complaining of further palpitation,     diarrhea, or nervousness.  5. Osteoarthritis.  Examination of knees demonstrated crepitus bilaterally.     Moreover, examination of fingers demonstrates some interphalangeal joint     swelling.  The patient's joints revealed no redness or hotness.  The     patient has not complained with joint pain or stiffness during this    hospitalization.  The patient will be advised to take Tylenol 500 mg p.o.     b.i.d. or t.i.d.  as needed.  The patient will be discharged to her home     on November 16, 2001.   LABORATORY DATA:  Other significant labs during this hospitalization  revealed:  Cardiac enzymes on November 14, 2001 at 1745:  Total CPK 177, CK-MB  2.0, troponin I 0.01; November 15, 2001:  Total CPK 131, CK-MB 2.0, troponin I  0.05; November 15, 2001 at 0915:  Total CPK 121,  CK-MB 2.1, troponin I 0.04.   INSTRUCTIONS AT TIME OF DISCHARGE:  1. Activity:  As tolerated.  2. Diet:  A 4 gm sodium, low cholesterol.  3. Medications:     a. Verapamil 240 mg p.o. q.d.     b. Hydrochlorothiazide 12.5 mg p.o. q.d.     c. Klor-Con 20 mg p.o. once weekly.     d. Pravachol 40 mg p.o. q.h.s.     e. Aspirin 81 mg one p.o. q.d.     f. Xanax 0.25 mg one-half tablet p.o. b.i.d.  4. Follow-up:  At the office x1 week.   FINAL PRIMARY DIAGNOSIS:  Atypical chest pain secondary to paroxysmal  supraventricular tachycardia.   SECONDARY DIAGNOSES:  1. Hypertension.  2.     Hyperlipidemia.  3. Hypothyroidism.  4. Osteoarthritis.                                               Barrie Folk. Berdine Addison, M.D.    Armanda Heritage  D:  11/15/2001  T:  11/16/2001  Job:  [id number]

## 2010-08-13 NOTE — Op Note (Signed)
   NAME:  Brandi Rice, Brandi Rice                            ACCOUNT NO.:  [id number]   MEDICAL RECORD NO.:  [id number]                   PATIENT TYPE:  INP   LOCATION:  A323                                 FACILITY:  [facility]   PHYSICIAN:  Vernon Prey. Tamala Julian, M.D.                DATE OF BIRTH:  [birth year]   DATE OF PROCEDURE:  12/14/2001  DATE OF DISCHARGE:                                 OPERATIVE REPORT   PREOPERATIVE DIAGNOSIS:  Carcinoma left breast.   POSTOPERATIVE DIAGNOSIS:  Carcinoma left breast.   PROCEDURE:  Left modified radical mastectomy.   SURGEON:  Vernon Prey. Tamala Julian, M.D.   DESCRIPTION OF PROCEDURE:  Under general anesthesia the patient's chest,  breast, neck, and left upper extremity were prepped and draped in a sterile  field.  Confirmation of surgery on the left side was made by all members of  the operating team.  An elliptical incision was made around the old incision  and breast flaps were developed.  The superior-most flap was dissected over  to the sternum and the subclavicular area.  The breast was then removed from  the pectoralis fascia.  Inferior flap was developed.  Dissection then was  carried out into the axilla.  The lower and middle third of the axilla were  dissected.  I did not extend the dissection to the apex of the axilla,  though palpable adenopathy was noted.   The thoracodorsal and neurovascular bundle and long thoracic nerve of Bell  were preserved.  Hemostasis was achieved.  JP drains were placed and  secured.  These flaps were closed with 2-0 Biosyn and 4-0 Biosyn.  The skin  was closed with staples and 4-0 Dexon.  Dressing was placed.  The patient  tolerated the procedure well.  She was transferred to a bed and taken to the  postanesthetic care unit for further monitoring.                                                Vernon Prey. Tamala Julian, M.D.    LCS/MEDQ  D:  12/14/2001  T:  12/17/2001  Job:  [id number]

## 2010-08-13 NOTE — Procedures (Signed)
   NAME:  Brandi Rice, Brandi Rice                            ACCOUNT NO.:  [id number]   MEDICAL RECORD NO.:  [id number]                   PATIENT TYPE:  OUT   LOCATION:  RAD                                  FACILITY:  [facility]   PHYSICIAN:  Marcello Moores C. Wall, M.D.                DATE OF BIRTH:  1921/05/30   DATE OF PROCEDURE:  DATE OF DISCHARGE:                                  ECHOCARDIOGRAM   INDICATIONS:  429.2   FINDINGS:  The echocardiogram is technically adequate.   CONCLUSIONS:  1. Mild left atrial enlargement.  2. Normal mitral valve with mild mitral regurgitation.  3. Normal left ventricular chamber size and overall systolic function.     There is disproportion upper septal wall thickness with the sigmoid     septum, but no outflow tract obstruction.  4. Mild aortic valve sclerosis without stenosis.  5. Normal right-sided structures and function.  6. Mild tricuspid regurgitation.  Estimated right ventricular systolic     pressure is around 30-35 mmHg.  7. Compared to the previous echocardiogram on November 15, 2001 there has been     no change.      ___________________________________________                                            Marijo Conception. Wall, M.D.   TCW/MEDQ  D:  12/16/2002  T:  12/16/2002  Job:  [id number]   cc:   [name], M.D.  [address]  [place], [place] [address]  Fax: [phone]

## 2010-08-13 NOTE — Discharge Summary (Signed)
   NAME:  Brandi Rice, Brandi Rice NO.:  [id number]   MEDICAL RECORD NO.:  [id number]                   PATIENT TYPE:   LOCATION:                                       FACILITY:   PHYSICIAN:  Vernon Prey. Tamala Julian, M.D.                DATE OF BIRTH:   DATE OF ADMISSION:  12/14/2001  DATE OF DISCHARGE:  12/17/2001                                 DISCHARGE SUMMARY   DISCHARGE DIAGNOSES:  1. Invasive carcinoma, left breast.  2. Hypertension.  3. Supraventricular tachycardia.   SPECIAL PROCEDURE:  Left modified radical mastectomy, September 19.   DISPOSITION:  The patient discharged home in stable, satisfactory condition  with home health followup.   FOLLOW UP:  She will be followed in the office October 1.   DISCHARGE MEDICATIONS:  1. Verapamil SR 240 mg q.d.  2. Hydrochlorothiazide 12.5 mg q.d.  3. Pravachol 40 mg q.d.  4. Xanax 0.25 mg b.i.d.  5. Levoxyl 0.05 mg q.d.  6. Aspirin 81 mg q.d.  7. Darvocet-N 1/2 tablet q.4-6h. p.r.n. pain.   SUMMARY:  The patient is a 77 year old female who had an abnormal mammogram  with microcalcifications.  Changes were felt to be suspicious for carcinoma.  She underwent partial mastectomy on September 16.  Pathology showed invasive  carcinoma.  The patient was given all options of therapy and was offered a  second opinion.  She had a second opinion and decided to have a modified  radical mastectomy.  This was done on September 19 uneventfully.  She was  stable in the postoperative course.  She had no emotional difficulty.  Her  wound look nicely.  She did not have much JP drainage.  She remained stable  and was discharged home on the morning of the third hospital day in  satisfactory condition.                                               Vernon Prey. Tamala Julian, M.D.    LCS/MEDQ  D:  03/16/2002  T:  03/18/2002  Job:  [id number]

## 2010-08-13 NOTE — Consult Note (Signed)
NAME:  Brandi Rice, Brandi Rice                            ACCOUNT NO.:  [id number]   MEDICAL RECORD NO.:  [id number]                   PATIENT TYPE:  INP   LOCATION:  IC10                                 FACILITY:  [facility]   PHYSICIAN:  Cristopher Estimable. Lattie Haw, M.D. [facility]        DATE OF BIRTH:  [birth year]   DATE OF CONSULTATION:  DATE OF DISCHARGE:                              CARDIOLOGY CONSULTATION   PRIMARY CARDIOLOGIST:  Marcello Moores C. Wall, M.D.   HISTORY OF PRESENT ILLNESS:  A 77 year old woman admitted to hospital with  supraventricular tachycardia.  The patient has had episodic palpitations for  the past three years.  Symptoms last minutes to hours.  There is sometimes  accompanying dizziness but has been no loss of consciousness.  She was seen  in the emergency department approximately a year ago for this problem, at  which time she believes PSVT was recorded.  She was not started on any new  medication at that time.  She is now admitted after atypical episodes.  She  was given 10 mg of intravenous diltiazem in the emergency department with  conversion to sinus rhythm.  She denies any chest discomfort.  There is no  history of other cardiac disorders.  She has had some hypertension that has  been well controlled with medication, as well as hyperlipidemia.   CURRENT MEDICATIONS:  1. Monopril 10 mg q.d.  2. Aspirin 1 q.d.  3. HCTZ 25 mg q.d.  4. Levothyroxine 0.05 mg q.d.  5. Pletal 50 mg b.i.d.  6. Pravastatin 40 mg q.d.  7. Kay Ciel 10 mEq q.d.   PAST MEDICAL HISTORY:  Notable for thyroidectomy in 1962 due to goiter.  She  has had a biopsy of a benign left breast mass and vaginal polypectomy.  An  event monitor in August 2000 revealed no arrhythmias.  She had an  echocardiogram at that time that was essentially unremarkable.   SOCIAL HISTORY:  Remote use of tobacco; no excessive alcohol.   FAMILY HISTORY:  Father died at age 75.  She has two sisters in their 79s  who have coronary disease  with prior myocardial infarction.   REVIEW OF SYSTEMS:  Negative except as noted above.   PHYSICAL EXAMINATION:  GENERAL:  Pleasant older woman in no acute distress.  VITAL SIGNS:  The weight is 195 pounds, temperature 98.3 degrees, blood  pressure 115/60, heart rate 60 and regular, respirations 18.  NECK:  No jugular venous distention; surgical incision at the base of her  neck; harsh left carotid bruit.  HEENT:  Bilateral arcus.  SKIN:  Mole over the right face.  ENDOCRINE:  No thyromegaly.  HEMATOPOIETIC:  No adenopathy.  LUNGS:  Clear.  CARDIAC:  Fourth heart sound and minimal early systolic ejection murmur.  ABDOMEN:  Benign without organomegaly; aortic pulsation not appreciated.  EXTREMITIES:  Distal pulses intact; no edema.  NEUROMUSCULAR:  Normal HIF; symmetric strength and tone.  LABORATORY DATA:  Initial EKG:  PSVT at a rate of 178 with ST segment  depression in multiple leads; question of retrograde P just after the QRS.  After Cardizem, the EKG shows normal sinus rhythm with borderline first-  degree block and minimal nonspecific ST segment abnormality.   Initial laboratory studies are unremarkable.   IMPRESSION:  A very nice woman who is generally healthy and now presents  with documented paroxysmal supraventricular tachycardia.  Onset at age 70 is  relatively late in life but certainly not unheard of.  The initial approach  should be medical, particularly in light of her need for antihypertensive  medication.  Since she has some degree of conduction system disease with  first-degree AV block and sinus bradycardia, verapamil will probably be the  most benign initial medication.  An echocardiogram is pending.  Otherwise,  there is no need for further workup or hospital observation.  We will be  happy to follow her as an outpatient and greatly appreciate the consultation  request.                                                Cristopher Estimable. Lattie Haw, M.D. [facility]     RMR/MEDQ  D:  11/15/2001  T:  11/15/2001  Job:  [id number]

## 2010-08-13 NOTE — Group Therapy Note (Signed)
NAME:  Brandi Rice, Brandi Rice                  ACCOUNT NO.:  [id number]   MEDICAL RECORD NO.:  [id number]          PATIENT TYPE:  INP   LOCATION:  A218                          FACILITY:  [facility]   PHYSICIAN:  Edward L. Luan Pulling, M.D.DATE OF BIRTH:  1921-10-19   DATE OF PROCEDURE:  02/11/2005  DATE OF DISCHARGE:                                   PROGRESS NOTE   PROBLEM:  Pulmonary embolus.   SUBJECTIVE:  Ms. Berkson says she is doing okay. She feels alright. She is not  having any new complaints. She still gets somewhat dyspneic when she takes  her oxygen off.   PHYSICAL EXAMINATION:  VITAL SIGNS:  Her physical exam today shows a  temperature is 97.6, pulse 66, respirations 18, blood pressure 128/72, O2  saturation 100% on 2 liters.   ASSESSMENT:  She is better.   PLAN:  I am going to go ahead and see if we can get a O2 sat done on room  air and see where she is as far as that goes. She may need home O2. It is  not clear at this point.      Edward L. Luan Pulling, M.D.  Electronically Signed     ELH/MEDQ  D:  02/11/2005  T:  02/11/2005  Job:  [id number]

## 2010-08-13 NOTE — Discharge Summary (Signed)
   NAME:  Brandi Rice, Brandi Rice                            ACCOUNT NO.:  [id number]   MEDICAL RECORD NO.:  [id number]                   PATIENT TYPE:  OIB   LOCATION:  6532                                 FACILITY:  [place]   PHYSICIAN:  Champ Mungo. Lovena Le, M.D.               DATE OF BIRTH:  1922/03/20   DATE OF ADMISSION:  02/03/2003  DATE OF DISCHARGE:  02/04/2003                                 DISCHARGE SUMMARY   PRIMARY DIAGNOSIS:  Supraventricular tachycardia.   HISTORY OF PRESENT ILLNESS:  This is a 77 year old female with past medical  history of tachy palpitations who was admitted for EP study ablation of SVT.   PAST MEDICAL HISTORY:  1. Positive for hypothyroid.  2. Hypercholesterolemia.  3. Hypertension.  4. Breast cancer status post left breast mastectomy.  5. Thyroidectomy.  6. Laser eye surgery.   ALLERGIES:  LATEX.   HOSPITAL COURSE:  The patient was admitted and underwent a successful AVNRT  ablation. She tolerated the procedure well and had no immediate  postoperative complications and was discharged the following day in stable  condition.   She was discharged on all of her previous medications:  1. Pravachol 40 nightly.  2. Synthroid 50 mcg daily.  3. Aspirin 325 mg daily.  4. Hydrochlorothiazide 25 daily.  5. Cardizem CD 180 daily.  6. Pletal 50 bid  7. Antibiotic prophylaxis for the next six months.  8. Tylenol one to two tablets every four to six hours as needed for pain.   ACTIVITY:  No heavy lifting or strenuous activity for four days.   DIET:  Low fat, low cholesterol, low salt diet.    FOLLOW UP:  She is to call if she develops a lump or any drainage in her  groin. She is to follow with Dr. Lovena Le in the [place] office in six to  eight weeks, and the office will call her to schedule that appointment.      Forest Becker, C.R.N.P. [facility]                 Champ Mungo. Lovena Le, M.D.    DS/MEDQ  D:  02/04/2003  T:  02/04/2003  Job:  [id number]

## 2010-08-13 NOTE — Group Therapy Note (Signed)
NAME:  Brandi Rice, Brandi Rice                  ACCOUNT NO.:  [id number]   MEDICAL RECORD NO.:  [id number]          PATIENT TYPE:  INP   LOCATION:  A218                          FACILITY:  [facility]   PHYSICIAN:  Edward L. Luan Pulling, M.D.DATE OF BIRTH:  Mar 01, 1922   DATE OF PROCEDURE:  02/13/2005  DATE OF DISCHARGE:                                   PROGRESS NOTE   PROBLEM:  Pulmonary embolus.   SUBJECTIVE:  Ms. Skalski says she feels great. She has been up moving around.  She has got a little bit of a nose bleed which I told her is probably  related to her oxygen.   OBJECTIVE:  VITAL SIGNS:  Her exam shows her temperature is 98.6, pulse 88,  respirations are 20. Blood sugar stated to be 195.9; I believe that is her  weight. Blood pressure 119/81, O2 saturation 97%.  CHEST:  Her chest is clear.   ASSESSMENT/PLAN:  She says that arrangements are being made for her to go  home tomorrow which I think is appropriate. At this point I will plan to  sign off. Thank you very much for allowing me to see her with you.      Edward L. Luan Pulling, M.D.  Electronically Signed     ELH/MEDQ  D:  02/13/2005  T:  02/14/2005  Job:  [id number]

## 2010-08-14 IMAGING — CR DG CHEST 2V
2 series · 2 of 2 positions shown · non-contrast
Comparison: 10/24/2008

CLINICAL DATA: Wheezing

CHEST - 2 VIEW

[view not recorded (1 of 2)]
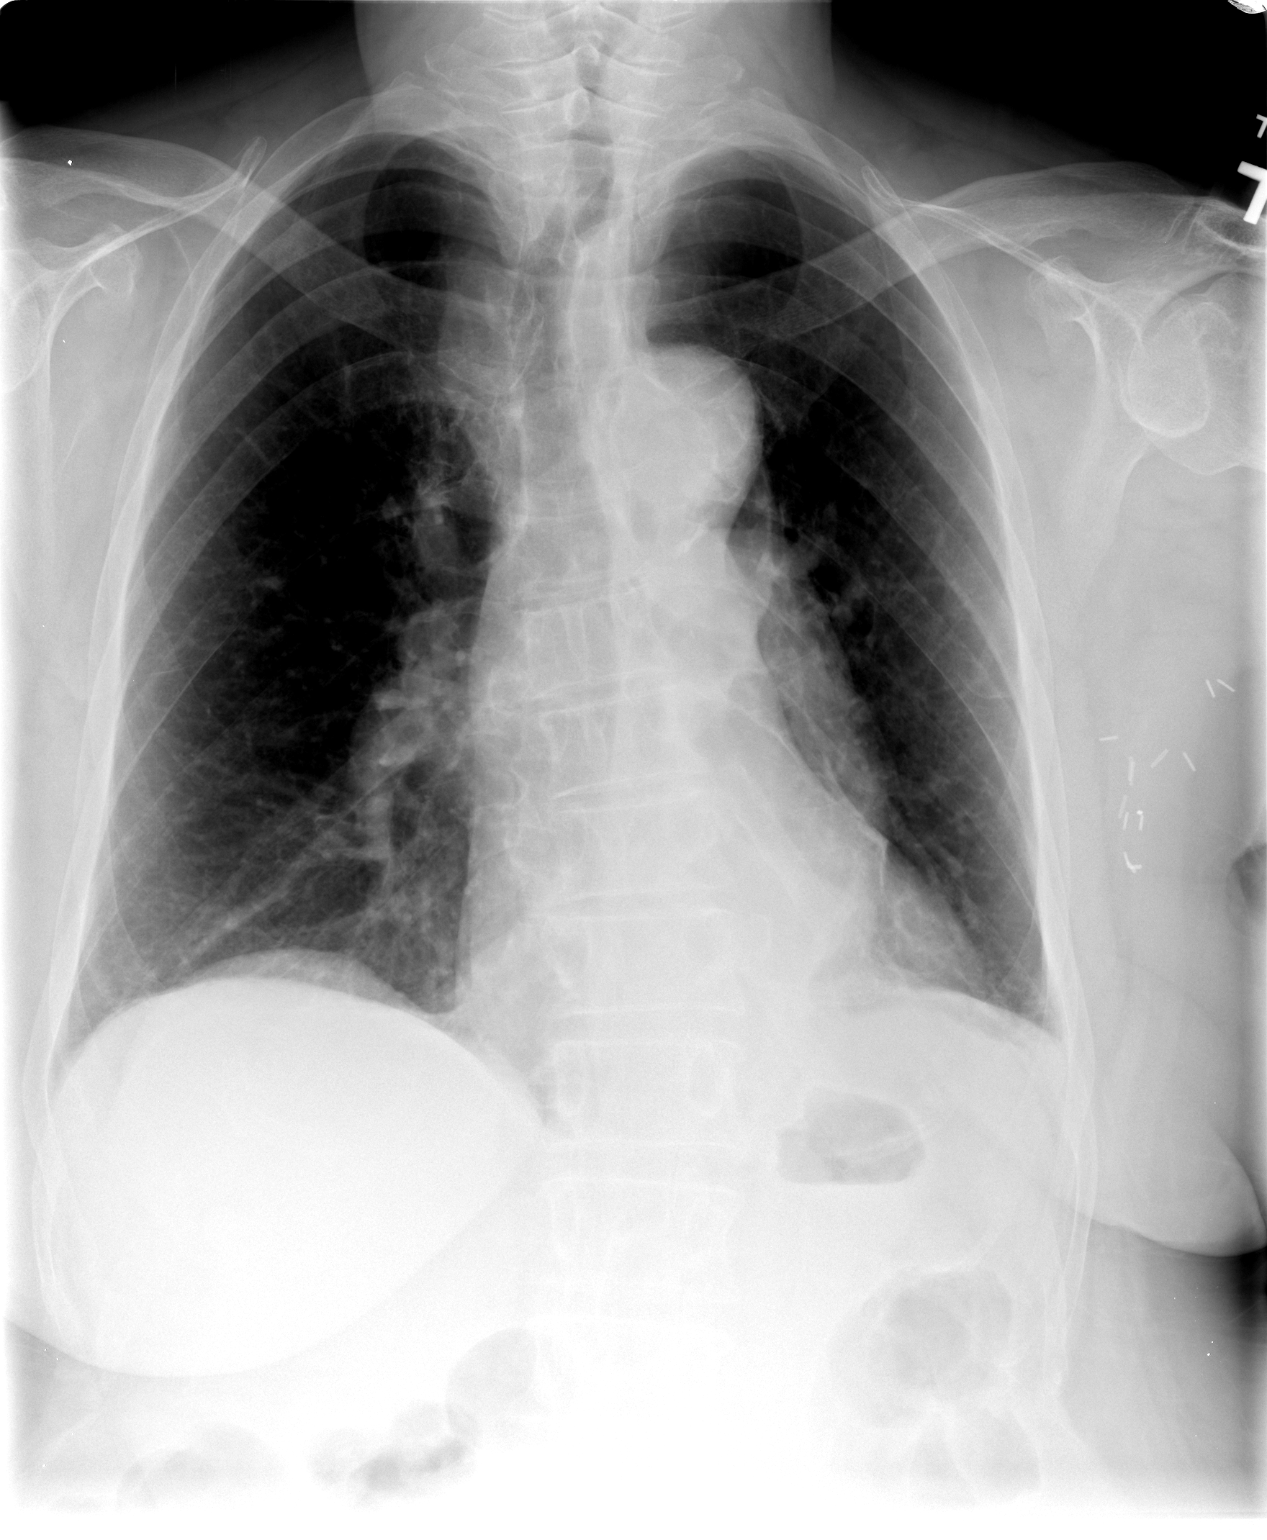

[view not recorded (2 of 2)]
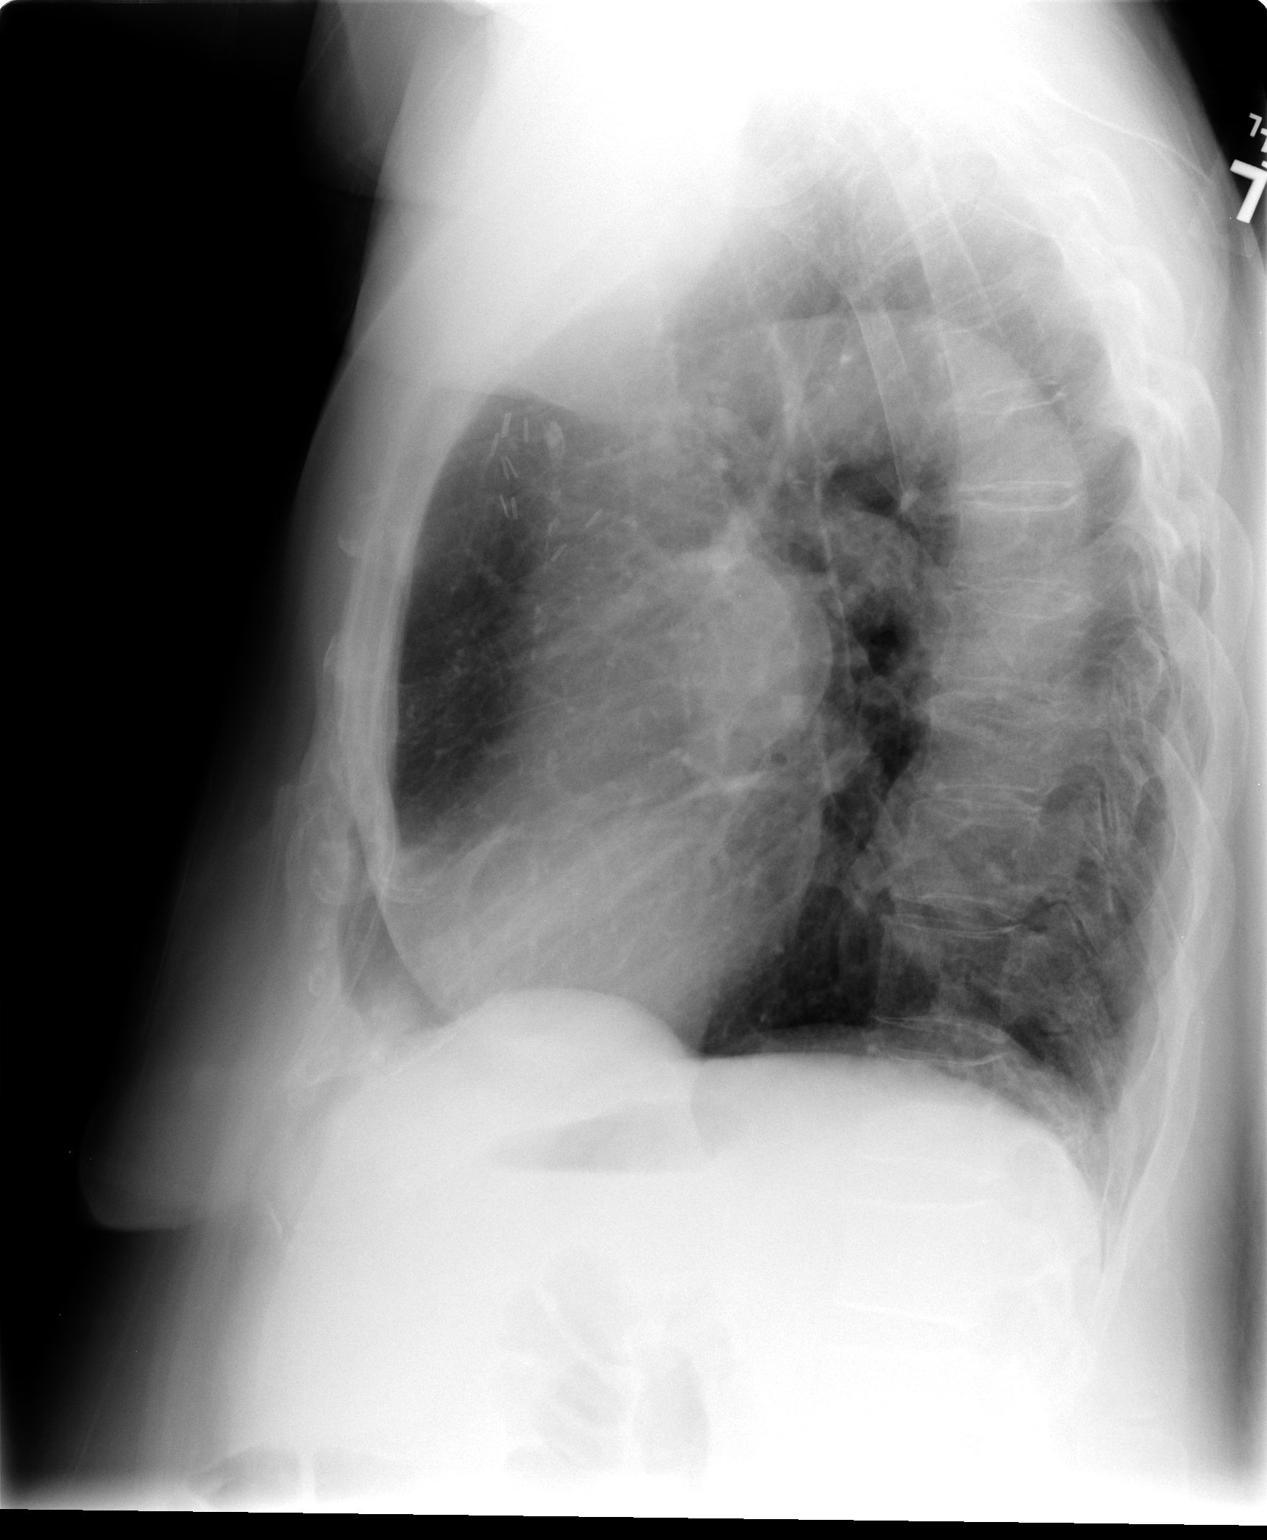

[2 of 2 positions shown; findings below may reference images not displayed]

FINDINGS: Minimal cardiac enlargement.
Calcified tortuous aorta.
Pulmonary vascularity normal.
Emphysematous changes with minimal left basilar atelectasis.
Lungs otherwise clear.
Thoracolumbar scoliosis.
Prior left breast surgery and axillary lymph node dissection.
Bony demineralization.
IMPRESSION: Minimal left basilar atelectasis.

## 2010-08-16 LAB — GLUCOSE, CAPILLARY
Glucose-Capillary: 125 mg/dL — ABNORMAL HIGH (ref 70–99)
Glucose-Capillary: 139 mg/dL — ABNORMAL HIGH (ref 70–99)

## 2010-08-20 LAB — GLUCOSE, CAPILLARY
Glucose-Capillary: 132 mg/dL — ABNORMAL HIGH (ref 70–99)
Glucose-Capillary: 132 mg/dL — ABNORMAL HIGH (ref 70–99)
Glucose-Capillary: 114 mg/dL — ABNORMAL HIGH (ref 70–99)

## 2010-08-27 LAB — GLUCOSE, CAPILLARY: Glucose-Capillary: 122 mg/dL — ABNORMAL HIGH (ref 70–99)

## 2010-09-03 LAB — GLUCOSE, CAPILLARY
Glucose-Capillary: 117 mg/dL — ABNORMAL HIGH (ref 70–99)
Glucose-Capillary: 118 mg/dL — ABNORMAL HIGH (ref 70–99)

## 2010-09-10 LAB — GLUCOSE, CAPILLARY: Glucose-Capillary: 123 mg/dL — ABNORMAL HIGH (ref 70–99)

## 2010-09-13 LAB — GLUCOSE, CAPILLARY
Glucose-Capillary: 135 mg/dL — ABNORMAL HIGH (ref 70–99)
Glucose-Capillary: 148 mg/dL — ABNORMAL HIGH (ref 70–99)

## 2010-09-17 LAB — GLUCOSE, CAPILLARY
Glucose-Capillary: 131 mg/dL — ABNORMAL HIGH (ref 70–99)
Glucose-Capillary: 157 mg/dL — ABNORMAL HIGH (ref 70–99)

## 2010-09-20 LAB — GLUCOSE, CAPILLARY

## 2010-09-25 LAB — GLUCOSE, CAPILLARY

## 2010-09-27 LAB — GLUCOSE, CAPILLARY
Glucose-Capillary: 134 mg/dL — ABNORMAL HIGH (ref 70–99)
Glucose-Capillary: 96 mg/dL (ref 70–99)

## 2010-09-29 LAB — GLUCOSE, CAPILLARY: Glucose-Capillary: 108 mg/dL — ABNORMAL HIGH (ref 70–99)

## 2010-10-01 LAB — GLUCOSE, CAPILLARY
Glucose-Capillary: 119 mg/dL — ABNORMAL HIGH (ref 70–99)
Glucose-Capillary: 161 mg/dL — ABNORMAL HIGH (ref 70–99)

## 2010-10-04 LAB — GLUCOSE, CAPILLARY

## 2010-10-10 LAB — GLUCOSE, CAPILLARY: Glucose-Capillary: 141 mg/dL — ABNORMAL HIGH (ref 70–99)

## 2010-10-11 LAB — GLUCOSE, CAPILLARY: Glucose-Capillary: 178 mg/dL — ABNORMAL HIGH (ref 70–99)

## 2010-10-15 LAB — GLUCOSE, CAPILLARY
Glucose-Capillary: 133 mg/dL — ABNORMAL HIGH (ref 70–99)
Glucose-Capillary: 208 mg/dL — ABNORMAL HIGH (ref 70–99)

## 2010-10-18 LAB — GLUCOSE, CAPILLARY: Glucose-Capillary: 132 mg/dL — ABNORMAL HIGH (ref 70–99)

## 2010-10-27 LAB — GLUCOSE, CAPILLARY: Glucose-Capillary: 108 mg/dL — ABNORMAL HIGH (ref 70–99)

## 2010-10-29 LAB — GLUCOSE, CAPILLARY: Glucose-Capillary: 142 mg/dL — ABNORMAL HIGH (ref 70–99)

## 2010-11-05 LAB — GLUCOSE, CAPILLARY
Glucose-Capillary: 123 mg/dL — ABNORMAL HIGH (ref 70–99)
Glucose-Capillary: 117 mg/dL — ABNORMAL HIGH (ref 70–99)

## 2010-11-07 LAB — GLUCOSE, CAPILLARY: Glucose-Capillary: 135 mg/dL — ABNORMAL HIGH (ref 70–99)

## 2010-11-15 LAB — GLUCOSE, CAPILLARY: Glucose-Capillary: 178 mg/dL — ABNORMAL HIGH (ref 70–99)

## 2010-11-19 LAB — GLUCOSE, CAPILLARY
Glucose-Capillary: 125 mg/dL — ABNORMAL HIGH (ref 70–99)
Glucose-Capillary: 135 mg/dL — ABNORMAL HIGH (ref 70–99)

## 2010-11-21 LAB — GLUCOSE, CAPILLARY: Glucose-Capillary: 122 mg/dL — ABNORMAL HIGH (ref 70–99)

## 2010-11-22 LAB — GLUCOSE, CAPILLARY: Glucose-Capillary: 168 mg/dL — ABNORMAL HIGH (ref 70–99)

## 2010-12-03 LAB — GLUCOSE, CAPILLARY: Glucose-Capillary: 168 mg/dL — ABNORMAL HIGH (ref 70–99)

## 2010-12-05 LAB — GLUCOSE, CAPILLARY: Glucose-Capillary: 120 mg/dL — ABNORMAL HIGH (ref 70–99)

## 2010-12-06 LAB — GLUCOSE, CAPILLARY

## 2010-12-10 LAB — GLUCOSE, CAPILLARY: Glucose-Capillary: 126 mg/dL — ABNORMAL HIGH (ref 70–99)

## 2010-12-13 LAB — GLUCOSE, CAPILLARY: Glucose-Capillary: 150 mg/dL — ABNORMAL HIGH (ref 70–99)

## 2010-12-17 LAB — BASIC METABOLIC PANEL
BUN: 13
Calcium: 9.4
GFR calc non Af Amer: 51 — ABNORMAL LOW
Glucose, Bld: 144 — ABNORMAL HIGH
Sodium: 138

## 2010-12-17 LAB — CBC
HCT: 44
Hemoglobin: 14.5
MCHC: 32.9
Platelets: 222
RDW: 14

## 2010-12-24 LAB — GLUCOSE, CAPILLARY: Glucose-Capillary: 116 mg/dL — ABNORMAL HIGH (ref 70–99)

## 2010-12-27 LAB — DIFFERENTIAL
Basophils Absolute: 0
Eosinophils Absolute: 0
Eosinophils Absolute: 0.1
Eosinophils Relative: 0
Eosinophils Relative: 1
Lymphs Abs: 1.1
Lymphs Abs: 1.8
Monocytes Absolute: 0.8
Monocytes Relative: 11

## 2010-12-27 LAB — COMPREHENSIVE METABOLIC PANEL
ALT: 17
AST: 26
CO2: 29
Chloride: 101
GFR calc Af Amer: 58 — ABNORMAL LOW
GFR calc non Af Amer: 48 — ABNORMAL LOW
Potassium: 3.5
Sodium: 140
Total Bilirubin: 0.7

## 2010-12-27 LAB — CBC
HCT: 37
Hemoglobin: 12.1
MCV: 78.1
RBC: 4.73
RBC: 5.27 — ABNORMAL HIGH
WBC: 7
WBC: 8.4

## 2010-12-27 LAB — PROTIME-INR
INR: 1.7 — ABNORMAL HIGH
Prothrombin Time: 23.6 — ABNORMAL HIGH

## 2010-12-27 LAB — BASIC METABOLIC PANEL
BUN: 8
Chloride: 101
GFR calc Af Amer: >60
Potassium: 3.5

## 2010-12-27 LAB — URINALYSIS, ROUTINE W REFLEX MICROSCOPIC
Bilirubin Urine: NEGATIVE
Hgb urine dipstick: NEGATIVE
Specific Gravity, Urine: 1.025
Urobilinogen, UA: 0.2

## 2010-12-31 LAB — GLUCOSE, CAPILLARY: Glucose-Capillary: 124 mg/dL — ABNORMAL HIGH (ref 70–99)

## 2011-01-03 LAB — GLUCOSE, CAPILLARY
Glucose-Capillary: 139 mg/dL — ABNORMAL HIGH (ref 70–99)
Glucose-Capillary: 166 mg/dL — ABNORMAL HIGH (ref 70–99)

## 2011-01-04 LAB — COMPREHENSIVE METABOLIC PANEL
Alkaline Phosphatase: 61
BUN: 8
CO2: 26
Chloride: 107
Creatinine, Ser: 0.92
GFR calc non Af Amer: 58 — ABNORMAL LOW
Total Bilirubin: 0.6

## 2011-01-04 LAB — DIFFERENTIAL
Basophils Absolute: 0
Basophils Absolute: 0.1
Basophils Relative: 0
Basophils Relative: 2 — ABNORMAL HIGH
Eosinophils Absolute: 0.1 — ABNORMAL LOW
Eosinophils Absolute: 0.3
Eosinophils Relative: 6 — ABNORMAL HIGH
Eosinophils Relative: 8 — ABNORMAL HIGH
Lymphocytes Relative: 34
Lymphs Abs: 1.4
Monocytes Absolute: 0.6
Monocytes Relative: 14 — ABNORMAL HIGH
Neutro Abs: 1.6 — ABNORMAL LOW
Neutro Abs: 2
Neutrophils Relative %: 49

## 2011-01-04 LAB — BASIC METABOLIC PANEL
BUN: 8
CO2: 29
Calcium: 8.7
Chloride: 104
Chloride: 99
Creatinine, Ser: 1.09
GFR calc non Af Amer: 48 — ABNORMAL LOW
Glucose, Bld: 131 — ABNORMAL HIGH
Sodium: 141

## 2011-01-04 LAB — CBC
HCT: 34.4 — ABNORMAL LOW
HCT: 38.1
Hemoglobin: 11.1 — ABNORMAL LOW
Hemoglobin: 12.3
MCHC: 32.3
MCV: 77.7 — ABNORMAL LOW
MCV: 78.2
MCV: 78.3
Platelets: 227
RBC: 4.4
RBC: 4.9
RDW: 13.8
WBC: 4.5
WBC: 4.6

## 2011-01-04 LAB — URINE CULTURE: Colony Count: >=100000

## 2011-01-04 LAB — PROTIME-INR
INR: 2.4 — ABNORMAL HIGH
Prothrombin Time: 27.4 — ABNORMAL HIGH
Prothrombin Time: 28.5 — ABNORMAL HIGH

## 2011-01-04 LAB — URINALYSIS, ROUTINE W REFLEX MICROSCOPIC
Bilirubin Urine: NEGATIVE
Ketones, ur: NEGATIVE
Nitrite: NEGATIVE
Specific Gravity, Urine: 1.025
Urobilinogen, UA: 0.2

## 2011-01-04 LAB — URINE MICROSCOPIC-ADD ON

## 2011-01-04 LAB — CULTURE, BLOOD (ROUTINE X 2)

## 2011-01-04 LAB — HEMOGLOBIN A1C: Hgb A1c MFr Bld: 7 — ABNORMAL HIGH

## 2011-01-10 LAB — URINALYSIS, ROUTINE W REFLEX MICROSCOPIC
Bilirubin Urine: NEGATIVE
Glucose, UA: NEGATIVE
Hgb urine dipstick: NEGATIVE
Specific Gravity, Urine: 1.02
Urobilinogen, UA: 0.2
pH: 6

## 2011-01-10 LAB — CBC
Hemoglobin: 12.2
MCHC: 32.2
MCV: 78.2
RDW: 13.5

## 2011-01-10 LAB — BASIC METABOLIC PANEL
CO2: 32
Calcium: 8.9
Chloride: 102
Creatinine, Ser: 1.05
Glucose, Bld: 137 — ABNORMAL HIGH

## 2011-01-10 LAB — POCT CARDIAC MARKERS: CKMB, poc: <1 — ABNORMAL LOW

## 2011-01-10 LAB — URINE CULTURE: Colony Count: >=100000

## 2011-01-10 LAB — DIFFERENTIAL
Basophils Absolute: 0
Basophils Relative: 1
Eosinophils Absolute: 0.1
Monocytes Absolute: 0.4
Neutro Abs: 2.3
Neutrophils Relative %: 53

## 2011-01-10 LAB — GLUCOSE, CAPILLARY: Glucose-Capillary: 162 mg/dL — ABNORMAL HIGH (ref 70–99)

## 2011-01-10 LAB — URINE MICROSCOPIC-ADD ON

## 2011-01-15 LAB — GLUCOSE, CAPILLARY: Glucose-Capillary: 79 mg/dL (ref 70–99)

## 2011-01-22 LAB — GLUCOSE, CAPILLARY: Glucose-Capillary: 147 mg/dL — ABNORMAL HIGH (ref 70–99)

## 2011-01-24 LAB — GLUCOSE, CAPILLARY: Glucose-Capillary: 130 mg/dL — ABNORMAL HIGH (ref 70–99)

## 2011-01-28 LAB — GLUCOSE, CAPILLARY: Glucose-Capillary: 123 mg/dL — ABNORMAL HIGH (ref 70–99)

## 2011-01-29 ENCOUNTER — Ambulatory Visit (HOSPITAL_COMMUNITY): Payer: Medicare Other | Attending: Internal Medicine

## 2011-01-29 DIAGNOSIS — R490 Dysphonia: Secondary | ICD-10-CM | POA: Insufficient documentation

## 2011-01-29 DIAGNOSIS — R059 Cough, unspecified: Secondary | ICD-10-CM | POA: Insufficient documentation

## 2011-01-29 DIAGNOSIS — R05 Cough: Secondary | ICD-10-CM | POA: Insufficient documentation

## 2011-01-31 LAB — GLUCOSE, CAPILLARY: Glucose-Capillary: 116 mg/dL — ABNORMAL HIGH (ref 70–99)

## 2011-02-07 LAB — GLUCOSE, CAPILLARY: Glucose-Capillary: 140 mg/dL — ABNORMAL HIGH (ref 70–99)

## 2011-02-11 LAB — GLUCOSE, CAPILLARY
Glucose-Capillary: 137 mg/dL — ABNORMAL HIGH (ref 70–99)
Glucose-Capillary: 144 mg/dL — ABNORMAL HIGH (ref 70–99)

## 2011-02-14 LAB — GLUCOSE, CAPILLARY: Glucose-Capillary: 128 mg/dL — ABNORMAL HIGH (ref 70–99)

## 2011-02-18 LAB — GLUCOSE, CAPILLARY: Glucose-Capillary: 120 mg/dL — ABNORMAL HIGH (ref 70–99)

## 2011-02-20 LAB — GLUCOSE, CAPILLARY: Glucose-Capillary: 106 mg/dL — ABNORMAL HIGH (ref 70–99)

## 2011-03-05 LAB — GLUCOSE, CAPILLARY: Glucose-Capillary: 119 mg/dL — ABNORMAL HIGH (ref 70–99)

## 2011-03-06 LAB — GLUCOSE, CAPILLARY: Glucose-Capillary: 113 mg/dL — ABNORMAL HIGH (ref 70–99)

## 2011-03-07 LAB — GLUCOSE, CAPILLARY: Glucose-Capillary: 169 mg/dL — ABNORMAL HIGH (ref 70–99)

## 2011-03-11 LAB — GLUCOSE, CAPILLARY: Glucose-Capillary: 174 mg/dL — ABNORMAL HIGH (ref 70–99)

## 2011-03-15 ENCOUNTER — Encounter: Payer: Self-pay | Admitting: Cardiology

## 2011-03-23 LAB — GLUCOSE, CAPILLARY: Glucose-Capillary: 153 mg/dL — ABNORMAL HIGH (ref 70–99)

## 2011-03-26 ENCOUNTER — Encounter (HOSPITAL_COMMUNITY): Payer: Self-pay | Admitting: *Deleted

## 2011-03-26 ENCOUNTER — Inpatient Hospital Stay
Admission: RE | Admit: 2011-03-26 | Discharge: 2015-06-18 | Disposition: A | Discharge status: Nursing Home (Includes ICF) | Payer: Self-pay | Source: Ambulatory Visit | Attending: Internal Medicine | Admitting: Internal Medicine

## 2011-03-26 ENCOUNTER — Other Ambulatory Visit: Payer: Self-pay

## 2011-03-26 ENCOUNTER — Inpatient Hospital Stay
Admission: RE | Admit: 2011-03-26 | Discharge: 2011-03-26 | Disposition: A | Discharge status: Nursing Home (Includes ICF) | Payer: Medicare Other | Source: Skilled Nursing Facility | Attending: Internal Medicine | Admitting: Internal Medicine

## 2011-03-26 ENCOUNTER — Emergency Department (HOSPITAL_COMMUNITY)
Admission: EM | Admit: 2011-03-26 | Discharge: 2011-03-26 | Disposition: A | Discharge status: Home / Self Care | Payer: Medicare Other | Attending: Emergency Medicine | Admitting: Emergency Medicine

## 2011-03-26 ENCOUNTER — Emergency Department (HOSPITAL_COMMUNITY)
Admission: EM | Admit: 2011-03-26 | Discharge: 2011-03-26 | Disposition: A | Discharge status: Skilled Nursing Facility | Payer: Medicare Other | Attending: Emergency Medicine | Admitting: Emergency Medicine

## 2011-03-26 ENCOUNTER — Emergency Department (HOSPITAL_COMMUNITY): Payer: Medicare Other

## 2011-03-26 DIAGNOSIS — N390 Urinary tract infection, site not specified: Secondary | ICD-10-CM | POA: Insufficient documentation

## 2011-03-26 DIAGNOSIS — I10 Essential (primary) hypertension: Secondary | ICD-10-CM | POA: Insufficient documentation

## 2011-03-26 DIAGNOSIS — E119 Type 2 diabetes mellitus without complications: Secondary | ICD-10-CM | POA: Insufficient documentation

## 2011-03-26 DIAGNOSIS — Z86718 Personal history of other venous thrombosis and embolism: Secondary | ICD-10-CM | POA: Insufficient documentation

## 2011-03-26 DIAGNOSIS — E785 Hyperlipidemia, unspecified: Secondary | ICD-10-CM | POA: Insufficient documentation

## 2011-03-26 DIAGNOSIS — Z7901 Long term (current) use of anticoagulants: Secondary | ICD-10-CM | POA: Insufficient documentation

## 2011-03-26 DIAGNOSIS — M479 Spondylosis, unspecified: Secondary | ICD-10-CM | POA: Insufficient documentation

## 2011-03-26 DIAGNOSIS — F0390 Unspecified dementia without behavioral disturbance: Secondary | ICD-10-CM | POA: Insufficient documentation

## 2011-03-26 DIAGNOSIS — R0602 Shortness of breath: Secondary | ICD-10-CM | POA: Insufficient documentation

## 2011-03-26 DIAGNOSIS — Z9010 Acquired absence of unspecified breast and nipple: Secondary | ICD-10-CM | POA: Insufficient documentation

## 2011-03-26 DIAGNOSIS — R110 Nausea: Secondary | ICD-10-CM | POA: Insufficient documentation

## 2011-03-26 DIAGNOSIS — R4182 Altered mental status, unspecified: Secondary | ICD-10-CM | POA: Insufficient documentation

## 2011-03-26 DIAGNOSIS — I739 Peripheral vascular disease, unspecified: Secondary | ICD-10-CM | POA: Insufficient documentation

## 2011-03-26 DIAGNOSIS — R05 Cough: Principal | ICD-10-CM

## 2011-03-26 DIAGNOSIS — R011 Cardiac murmur, unspecified: Secondary | ICD-10-CM | POA: Insufficient documentation

## 2011-03-26 DIAGNOSIS — E039 Hypothyroidism, unspecified: Secondary | ICD-10-CM | POA: Insufficient documentation

## 2011-03-26 DIAGNOSIS — R51 Headache: Secondary | ICD-10-CM | POA: Insufficient documentation

## 2011-03-26 DIAGNOSIS — R609 Edema, unspecified: Secondary | ICD-10-CM | POA: Insufficient documentation

## 2011-03-26 DIAGNOSIS — Z136 Encounter for screening for cardiovascular disorders: Secondary | ICD-10-CM | POA: Insufficient documentation

## 2011-03-26 DIAGNOSIS — K219 Gastro-esophageal reflux disease without esophagitis: Secondary | ICD-10-CM | POA: Insufficient documentation

## 2011-03-26 DIAGNOSIS — Z7982 Long term (current) use of aspirin: Secondary | ICD-10-CM | POA: Insufficient documentation

## 2011-03-26 LAB — DIFFERENTIAL
Basophils Absolute: 0 10*3/uL (ref 0.0–0.1)
Basophils Absolute: 0 10*3/uL (ref 0.0–0.1)
Basophils Relative: 1 % (ref 0–1)
Eosinophils Absolute: 0.1 10*3/uL (ref 0.0–0.7)
Eosinophils Relative: 2 % (ref 0–5)
Lymphocytes Relative: 15 % (ref 12–46)
Lymphs Abs: 0.9 10*3/uL (ref 0.7–4.0)
Monocytes Absolute: 0.6 10*3/uL (ref 0.1–1.0)
Monocytes Relative: 10 % (ref 3–12)
Neutro Abs: 4.2 10*3/uL (ref 1.7–7.7)
Neutro Abs: 3.5 10*3/uL (ref 1.7–7.7)
Neutrophils Relative %: 68 % (ref 43–77)

## 2011-03-26 LAB — URINALYSIS, ROUTINE W REFLEX MICROSCOPIC
Bilirubin Urine: NEGATIVE
Bilirubin Urine: NEGATIVE
Glucose, UA: NEGATIVE mg/dL
Ketones, ur: NEGATIVE mg/dL
Leukocytes, UA: NEGATIVE
Nitrite: NEGATIVE
Nitrite: POSITIVE — AB
Specific Gravity, Urine: 1.02 (ref 1.005–1.030)
Specific Gravity, Urine: >1.03 — ABNORMAL HIGH (ref 1.005–1.030)
Urobilinogen, UA: 0.2 mg/dL (ref 0.0–1.0)
pH: 7 (ref 5.0–8.0)

## 2011-03-26 LAB — GLUCOSE, CAPILLARY: Glucose-Capillary: 159 mg/dL — ABNORMAL HIGH (ref 70–99)

## 2011-03-26 LAB — BASIC METABOLIC PANEL
CO2: 32 mEq/L (ref 19–32)
Calcium: 9.8 mg/dL (ref 8.4–10.5)
Chloride: 99 mEq/L (ref 96–112)
Chloride: 98 mEq/L (ref 96–112)
Creatinine, Ser: 0.89 mg/dL (ref 0.50–1.10)
GFR calc Af Amer: 59 mL/min — ABNORMAL LOW (ref >90)
GFR calc non Af Amer: 51 mL/min — ABNORMAL LOW (ref >90)
Glucose, Bld: 144 mg/dL — ABNORMAL HIGH (ref 70–99)
Potassium: 4.2 mEq/L (ref 3.5–5.1)
Sodium: 139 mEq/L (ref 135–145)

## 2011-03-26 LAB — CBC
HCT: 37.1 % (ref 36.0–46.0)
Hemoglobin: 11.3 g/dL — ABNORMAL LOW (ref 12.0–15.0)
MCH: 24.7 pg — ABNORMAL LOW (ref 26.0–34.0)
MCHC: 29.2 g/dL — ABNORMAL LOW (ref 30.0–36.0)
MCV: 84.3 fL (ref 78.0–100.0)
Platelets: 222 10*3/uL (ref 150–400)
RBC: 4.4 MIL/uL (ref 3.87–5.11)
WBC: 5.8 10*3/uL (ref 4.0–10.5)

## 2011-03-26 LAB — TROPONIN I: Troponin I: <0.3 ng/mL (ref <0.30)

## 2011-03-26 LAB — POCT I-STAT TROPONIN I: Troponin i, poc: 0.01 ng/mL (ref 0.00–0.08)

## 2011-03-26 LAB — PROTIME-INR: INR: 1.07 (ref 0.00–1.49)

## 2011-03-26 LAB — URINE MICROSCOPIC-ADD ON

## 2011-03-26 MED ORDER — NITROFURANTOIN MONOHYD MACRO 100 MG PO CAPS: 100.0000 mg | ORAL_CAPSULE | Freq: Two times a day (BID) | ORAL | Status: AC

## 2011-03-26 NOTE — ED Notes (Signed)
[facility] staff states that patient had a large bm prior to arrival to er and got sob. Family wants her to be evaluated for pe

## 2011-03-26 NOTE — ED Provider Notes (Signed)
This chart was scribed for Brandi Christen, MD by Toniann Ket. The patient was seen in room APA03/APA03 and the patient's care was started at 8:02 PM.   CSN: [id number]  Arrival date & time 03/26/11  1941   First MD Initiated Contact with Patient 03/26/11 1957      Chief Complaint  Patient presents with  . Nausea  . Headache    (Consider location/radiation/quality/duration/timing/severity/associated sxs/prior treatment) The history is provided by the patient. The history is limited by the condition of the patient.  Pt seen at 8:03 PM  Level 5 caveat for dementia  AANIKA Rice is a 77 y.o. female who presents to the Emergency Department via EMS by request of her family. Pt states she doesn't know why she is in the ER . Pt has been at the [facility] since February of 2012.   Pt denies any pain, cp, sob.  Pt says she hasn't eaten today. Staff at [facility] states her o2 sat dropped to 84, pt was given 2 liters [place] and sat went to 98.    Past Medical History  Diagnosis Date  . PVD (peripheral vascular disease)   . History of supraventricular tachycardia   . Obesity   . Hyperlipidemia   . Hypertension   . Encounter for long-term (current) use of anticoagulants     coumadin  . DM (diabetes mellitus)   . Dermatomycosis, unspecified   . Acute cystitis   . Other malaise and fatigue   . GERD (gastroesophageal reflux disease)   . Hypothyroidism   . DJD (degenerative joint disease)     Spine  . Dementia   . History of pulmonary embolism     Past Surgical History  Procedure Date  . Mastectomy 2003    Left  . Thyroidectomy, partial 1962  . Atrial ablation surgery 01/2003    Catheter, to tachic palpitations    Family History  Problem Relation Age of Onset  . Coronary artery disease Sister   . Coronary artery disease Sister   . Coronary artery disease Sister   . Heart attack Sister   . Alcohol abuse Sister     History  Substance Use Topics  . Smoking status: Never  Smoker   . Smokeless tobacco: Not on file  . Alcohol Use: No    OB History    Grav Para Term Preterm Abortions TAB SAB Ect Mult Living                  Review of Systems  Unable to perform ROS Due to Level 5 caveat  Allergies  Contrast media and Latex  Home Medications   Current Outpatient Rx  Name Route Sig Dispense Refill  . ACETAMINOPHEN 325 MG PO TABS Oral Take 650 mg by mouth every 12 (twelve) hours.      . AMITRIPTYLINE HCL 10 MG PO TABS Oral Take 10 mg by mouth at bedtime.      . ASPIRIN 81 MG PO TABS Oral Take 160 mg by mouth daily.      Marland Kitchen BENZONATATE 100 MG PO CAPS Oral Take 100 mg by mouth 3 (three) times daily as needed.      . OS-CAL 500 + D PO Oral Take by mouth 1 day or 1 dose.      Marland Kitchen CALCIUM CARBONATE-VITAMIN D 500-200 MG-UNIT PO TABS Oral Take 1 tablet by mouth daily.      Marland Kitchen CILOSTAZOL 50 MG PO TABS Oral Take 50 mg by mouth  2 (two) times daily.      . DONEPEZIL HCL 10 MG PO TABS Oral Take 10 mg by mouth daily.      Marland Kitchen FERROUS GLUCONATE 324 (38 FE) MG PO TABS Oral Take 324 mg by mouth 2 (two) times daily.      . FUROSEMIDE 20 MG PO TABS Oral Take 20 mg by mouth daily.      Marland Kitchen GABAPENTIN 300 MG PO CAPS Oral Take 300 mg by mouth 2 (two) times daily.      Marland Kitchen HYDROCHLOROTHIAZIDE 25 MG PO TABS Oral Take 25 mg by mouth daily.      . IBUPROFEN 800 MG PO TABS Oral Take 800 mg by mouth 2 (two) times daily.      Marland Kitchen LEVOTHYROXINE SODIUM 50 MCG PO TABS Oral Take 50 mcg by mouth daily.      Marland Kitchen MEMANTINE HCL 10 MG PO TABS Oral Take 10 mg by mouth 2 (two) times daily.      Marland Kitchen METFORMIN HCL 500 MG PO TABS Oral Take 500 mg by mouth 2 (two) times daily with a meal.      . OMEPRAZOLE 20 MG PO CPDR Oral Take 20 mg by mouth at bedtime.      Marland Kitchen POTASSIUM CHLORIDE 10 MEQ PO TBCR Oral Take 10 mEq by mouth daily.      Marland Kitchen POTASSIUM CHLORIDE CRYS CR 20 MEQ PO TBCR Oral Take 20 mEq by mouth daily.      Marland Kitchen PRAVASTATIN SODIUM 80 MG PO TABS Oral Take 80 mg by mouth at bedtime.      Marland Kitchen PREDNISONE  (PAK) 5 MG PO TABS Oral Take 1 mg by mouth at bedtime.      . SULFAMETHOXAZOLE-TMP DS 800-160 MG PO TABS Oral Take 1 tablet by mouth 2 (two) times daily.      Marland Kitchen TEMAZEPAM 15 MG PO CAPS Oral Take 15 mg by mouth at bedtime as needed.      . WARFARIN SODIUM 5 MG PO TABS Oral Take 5 mg by mouth as directed.        BP 160/97  Pulse 112  Temp(Src) 99.9 F (37.7 C) (Oral)  Resp 20  SpO2 100%  Physical Exam  Nursing note and vitals reviewed. Constitutional: She appears well-developed and well-nourished. No distress.       demented  HENT:  Head: Normocephalic and atraumatic.  Eyes: EOM are normal. Pupils are equal, round, and reactive to light.  Neck: Normal range of motion. Neck supple. No tracheal deviation present.  Cardiovascular: Normal rate and normal heart sounds.   Pulmonary/Chest: Effort normal. No respiratory distress.  Abdominal: Soft. She exhibits no distension.  Musculoskeletal: Normal range of motion. She exhibits no edema.       2+ peripheral edema  Neurological: No sensory deficit.       Moving all extremities  Skin: Skin is warm and dry.  Psychiatric:       Flat affect    ED Course  Procedures (including critical care time) DIAGNOSTIC STUDIES: Oxygen Saturation is 100% on [place], normal by my interpretation.    COORDINATION OF CARE:    Labs Reviewed  CBC - Abnormal; Notable for the following:    Hemoglobin 10.9 (*)    MCH 24.7 (*)    MCHC 29.2 (*)    All other components within normal limits  DIFFERENTIAL - Abnormal; Notable for the following:    Monocytes Relative 13 (*)    All other components within normal  limits  BASIC METABOLIC PANEL - Abnormal; Notable for the following:    CO2 33 (*)    Glucose, Bld 104 (*)    GFR calc non Af Amer 51 (*)    GFR calc Af Amer 59 (*)    All other components within normal limits  URINALYSIS, ROUTINE W REFLEX MICROSCOPIC - Abnormal; Notable for the following:    APPearance CLOUDY (*)    Specific Gravity, Urine >1.030  (*)    Hgb urine dipstick TRACE (*)    Nitrite POSITIVE (*)    All other components within normal limits  URINE MICROSCOPIC-ADD ON - Abnormal; Notable for the following:    Squamous Epithelial / LPF MANY (*)    Bacteria, UA MANY (*)    All other components within normal limits  URINE CULTURE     Dg Chest Port 1 View  03/26/2011  *RADIOLOGY REPORT*  Clinical Data: Cough.  Shortness of breath.  Weakness.  Rule out pneumonia.  PORTABLE CHEST - 1 VIEW  Comparison: 01/29/2011  Findings: Mild hyperinflation.  Patient rotated right.  Mild cardiomegaly.  Tortuous descending thoracic aorta. No pleural effusion or pneumothorax.  Surgical clips within the left axilla or lateral left breast.  Artifact projects at the left costophrenic angle.  Mild bibasilar scarring. No congestive failure.  IMPRESSION:  1.  No evidence of pneumonia. 2.  Cardiomegaly and mild hyperinflation.  Original Report Authenticated By: Areta Haber, M.D.          No diagnosis found.    MDM  Patient shows no evidence of respiratory distress. Chest x-ray normal. Urine sample shows minor infection. she is nontoxic.  I personally performed the services described in this documentation, which was scribed in my presence. The recorded information has been reviewed and considered.       Brandi Christen, MD 03/26/11 2212

## 2011-03-26 NOTE — ED Provider Notes (Signed)
History     CSN: [id number]  Arrival date & time 03/26/11  0308   First MD Initiated Contact with Patient 03/26/11 [phone]      Chief Complaint  Patient presents with  . Hypertension    (Consider location/radiation/quality/duration/timing/severity/associated sxs/prior treatment) HPI Comments: 77 year old female with a history of hypertension, hyperlipidemia, dementia, pulmonary embolism and diabetes who presents with a complaint of "not feeling right". She states that she was having a feeling of something not right prior to developing a headache. She was found to be hypertensive at 200/110 and transported to the ER for further evaluation.  Patient does admit to some dysuria in the last few days  Patient is a 77 y.o. female presenting with hypertension. The history is provided by the patient and medical records. History Limited By: Dementia.  Hypertension    Past Medical History  Diagnosis Date  . PVD (peripheral vascular disease)   . History of supraventricular tachycardia   . Obesity   . Hyperlipidemia   . Hypertension   . Encounter for long-term (current) use of anticoagulants     coumadin  . DM (diabetes mellitus)   . Dermatomycosis, unspecified   . Acute cystitis   . Other malaise and fatigue   . GERD (gastroesophageal reflux disease)   . Hypothyroidism   . DJD (degenerative joint disease)     Spine  . Dementia   . History of pulmonary embolism     Past Surgical History  Procedure Date  . Mastectomy 2003    Left  . Thyroidectomy, partial 1962  . Atrial ablation surgery 01/2003    Catheter, to tachic palpitations    Family History  Problem Relation Age of Onset  . Coronary artery disease Sister   . Coronary artery disease Sister   . Coronary artery disease Sister   . Heart attack Sister   . Alcohol abuse Sister     History  Substance Use Topics  . Smoking status: Never Smoker   . Smokeless tobacco: Not on file  . Alcohol Use: No    OB History    Grav Para Term Preterm Abortions TAB SAB Ect Mult Living                  Review of Systems  Unable to perform ROS: Dementia    Allergies  Contrast media and Latex  Home Medications   Current Outpatient Rx  Name Route Sig Dispense Refill  . AMITRIPTYLINE HCL 10 MG PO TABS Oral Take 10 mg by mouth at bedtime.      Marland Kitchen BENZONATATE 100 MG PO CAPS Oral Take 100 mg by mouth 3 (three) times daily as needed.      Marland Kitchen CALCIUM CARBONATE-VITAMIN D 500-200 MG-UNIT PO TABS Oral Take 1 tablet by mouth daily.      Marland Kitchen CILOSTAZOL 50 MG PO TABS Oral Take 50 mg by mouth 2 (two) times daily.      . DONEPEZIL HCL 10 MG PO TABS Oral Take 10 mg by mouth daily.      Marland Kitchen FERROUS GLUCONATE 324 (38 FE) MG PO TABS Oral Take 324 mg by mouth 2 (two) times daily.      Marland Kitchen HYDROCHLOROTHIAZIDE 25 MG PO TABS Oral Take 25 mg by mouth daily.      . IBUPROFEN 800 MG PO TABS Oral Take 800 mg by mouth 2 (two) times daily.      Marland Kitchen LEVOTHYROXINE SODIUM 50 MCG PO TABS Oral Take 50 mcg by mouth daily.      Marland Kitchen  MEMANTINE HCL 10 MG PO TABS Oral Take 10 mg by mouth 2 (two) times daily.      Marland Kitchen METFORMIN HCL 500 MG PO TABS Oral Take 500 mg by mouth 2 (two) times daily with a meal.      . POTASSIUM CHLORIDE CRYS CR 20 MEQ PO TBCR Oral Take 20 mEq by mouth daily.      Marland Kitchen PRAVASTATIN SODIUM 80 MG PO TABS Oral Take 80 mg by mouth at bedtime.      Marland Kitchen PREDNISONE (PAK) 5 MG PO TABS Oral Take 1 mg by mouth at bedtime.      . SULFAMETHOXAZOLE-TMP DS 800-160 MG PO TABS Oral Take 1 tablet by mouth 2 (two) times daily.      Marland Kitchen TEMAZEPAM 15 MG PO CAPS Oral Take 15 mg by mouth at bedtime as needed.      . WARFARIN SODIUM 5 MG PO TABS Oral Take 5 mg by mouth as directed.        BP 163/78  Pulse 95  Temp(Src) 99.4 F (37.4 C) (Oral)  Resp 20  SpO2 90%  Physical Exam  Nursing note and vitals reviewed. Constitutional: She appears well-developed and well-nourished. No distress.  HENT:  Head: Normocephalic and atraumatic.  Mouth/Throat: Oropharynx is  clear and moist. No oropharyngeal exudate.  Eyes: Conjunctivae and EOM are normal. Pupils are equal, round, and reactive to light. Right eye exhibits no discharge. Left eye exhibits no discharge. No scleral icterus.  Neck: Normal range of motion. Neck supple. No JVD present. No thyromegaly present.  Cardiovascular: Normal rate, regular rhythm and intact distal pulses.  Exam reveals no gallop and no friction rub.   Murmur ( Soft systolic) heard. Pulmonary/Chest: Effort normal and breath sounds normal. No respiratory distress. She has no wheezes. She has no rales.  Abdominal: Soft. Bowel sounds are normal. She exhibits no distension and no mass. There is no tenderness.  Musculoskeletal: Normal range of motion. She exhibits no edema and no tenderness.  Lymphadenopathy:    She has no cervical adenopathy.  Neurological: She is alert. Coordination normal.  Skin: Skin is warm and dry. No rash noted. No erythema.  Psychiatric: She has a normal mood and affect. Her behavior is normal.    ED Course  Procedures (including critical care time)  Labs Reviewed  CBC - Abnormal; Notable for the following:    Hemoglobin 11.3 (*)    MCH 25.7 (*)    All other components within normal limits  BASIC METABOLIC PANEL - Abnormal; Notable for the following:    Glucose, Bld 144 (*)    GFR calc non Af Amer 57 (*)    GFR calc Af Amer 66 (*)    All other components within normal limits  URINALYSIS, ROUTINE W REFLEX MICROSCOPIC - Abnormal; Notable for the following:    Protein, ur TRACE (*)    Leukocytes, UA TRACE (*)    All other components within normal limits  URINE MICROSCOPIC-ADD ON - Abnormal; Notable for the following:    Squamous Epithelial / LPF MANY (*)    Bacteria, UA MANY (*)    All other components within normal limits  DIFFERENTIAL  TROPONIN I  PROTIME-INR  POCT I-STAT TROPONIN I  URINE CULTURE   Ct Head Wo Contrast  03/26/2011  *RADIOLOGY REPORT*  Clinical Data: Hypertension and headache   CT HEAD WITHOUT CONTRAST  Technique:  Contiguous axial images were obtained from the base of the skull through the vertex without contrast.  Comparison: 10/17/2008  Findings: Mild diffuse cerebral atrophy.  Low attenuation changes in the deep white matter consistent with small vessel ischemia. Ventricles and sulci are otherwise symmetrical without mass effect or midline shift.  No abnormal extra-axial fluid collections.  The gray-white matter junctions are distinct.  The basal cisterns are not effaced.  No evidence of acute intracranial hemorrhage.  No displaced skull fractures.  Vascular calcifications.  Opacification of some of the right posterior ethmoid air cells.  No significant changes since the previous study.  IMPRESSION: Diffuse atrophy and small vessel ischemic changes.  No evidence of acute intracranial hemorrhage, mass lesion, or acute infarct.  Original Report Authenticated By: Neale Burly, M.D.     1. Hypertension   2. Headache       MDM  Patient has normal neurologic exam, follows commands well, has some memory loss from dementia but is able to provide some history though she is vague on the details of what "doesn't feel right".  Will get EKG, labs, CT scan of her head. Currently her blood pressure has improved significantly at 163/78.  ED ECG REPORT   Date: 03/26/2011   Rate: 98  Rhythm: normal sinus rhythm  QRS Axis: normal  Intervals: normal  ST/T Wave abnormalities: normal  Conduction Disutrbances:none  Narrative Interpretation:   Old EKG Reviewed: unchanged from 01/20/2008  Blood work has been unremarkable, urinalysis shows likely contamination thus urine culture has been sent. Metabolic panel shows normal electrolytes and renal function, blood counts are normal, and according to the patient's medicine record she has no longer on Coumadin therapy. I have given written and verbal recommendations for blood pressure recheck within the week.  5:30 AM, patient  reevaluated and has persistently normal neurologic exam. Urine culture sent, will discharge  Johnna Acosta, MD 03/26/11 [phone]

## 2011-03-26 NOTE — ED Notes (Signed)
Pt was brought here by [facility] staff for c/o high blood pressure; staff reports pt's blood pressure was 200's/110's; pt was given Clonidine 0.1mg  with no results; pt c/o slight headache

## 2011-03-26 NOTE — ED Notes (Signed)
Sent to er per request of family. From [facility]. Staff states her o2 sat dropped to 84. Given 2liters [place] and sat went to 98

## 2011-03-28 LAB — URINE CULTURE: Culture  Setup Time: 201212292008

## 2011-03-29 LAB — URINE CULTURE: Colony Count: >=100000

## 2011-03-29 NOTE — ED Notes (Signed)
+   urine Patient treated with Macrobid-sensitive to same-Chart appended per protocol MD.

## 2011-03-30 ENCOUNTER — Ambulatory Visit (HOSPITAL_COMMUNITY)
Admission: RE | Admit: 2011-03-30 | Discharge: 2011-03-30 | Disposition: A | Discharge status: Home / Self Care | Payer: Medicare Other | Source: Ambulatory Visit | Attending: Internal Medicine | Admitting: Internal Medicine

## 2011-03-30 ENCOUNTER — Other Ambulatory Visit (HOSPITAL_BASED_OUTPATIENT_CLINIC_OR_DEPARTMENT_OTHER): Payer: Self-pay | Admitting: Internal Medicine

## 2011-03-30 DIAGNOSIS — J189 Pneumonia, unspecified organism: Secondary | ICD-10-CM

## 2011-04-05 LAB — GLUCOSE, CAPILLARY: Glucose-Capillary: 123 mg/dL — ABNORMAL HIGH (ref 70–99)

## 2011-04-08 LAB — GLUCOSE, CAPILLARY
Glucose-Capillary: 114 mg/dL — ABNORMAL HIGH (ref 70–99)
Glucose-Capillary: 124 mg/dL — ABNORMAL HIGH (ref 70–99)

## 2011-04-11 LAB — GLUCOSE, CAPILLARY
Glucose-Capillary: 114 mg/dL — ABNORMAL HIGH (ref 70–99)
Glucose-Capillary: 86 mg/dL (ref 70–99)

## 2011-04-18 LAB — GLUCOSE, CAPILLARY: Glucose-Capillary: 127 mg/dL — ABNORMAL HIGH (ref 70–99)

## 2011-04-25 LAB — GLUCOSE, CAPILLARY: Glucose-Capillary: 120 mg/dL — ABNORMAL HIGH (ref 70–99)

## 2011-04-29 LAB — GLUCOSE, CAPILLARY
Glucose-Capillary: 123 mg/dL — ABNORMAL HIGH (ref 70–99)
Glucose-Capillary: 148 mg/dL — ABNORMAL HIGH (ref 70–99)

## 2011-05-02 LAB — GLUCOSE, CAPILLARY
Glucose-Capillary: 120 mg/dL — ABNORMAL HIGH (ref 70–99)
Glucose-Capillary: 148 mg/dL — ABNORMAL HIGH (ref 70–99)

## 2011-05-05 LAB — GLUCOSE, CAPILLARY: Glucose-Capillary: 146 mg/dL — ABNORMAL HIGH (ref 70–99)

## 2011-05-09 LAB — GLUCOSE, CAPILLARY: Glucose-Capillary: 124 mg/dL — ABNORMAL HIGH (ref 70–99)

## 2011-05-16 LAB — GLUCOSE, CAPILLARY
Glucose-Capillary: 127 mg/dL — ABNORMAL HIGH (ref 70–99)
Glucose-Capillary: 134 mg/dL — ABNORMAL HIGH (ref 70–99)

## 2011-05-23 LAB — GLUCOSE, CAPILLARY: Glucose-Capillary: 117 mg/dL — ABNORMAL HIGH (ref 70–99)

## 2011-05-30 LAB — GLUCOSE, CAPILLARY
Glucose-Capillary: 124 mg/dL — ABNORMAL HIGH (ref 70–99)
Glucose-Capillary: 133 mg/dL — ABNORMAL HIGH (ref 70–99)

## 2011-06-03 LAB — GLUCOSE, CAPILLARY: Glucose-Capillary: 123 mg/dL — ABNORMAL HIGH (ref 70–99)

## 2011-06-06 LAB — GLUCOSE, CAPILLARY
Glucose-Capillary: 114 mg/dL — ABNORMAL HIGH (ref 70–99)
Glucose-Capillary: 195 mg/dL — ABNORMAL HIGH (ref 70–99)

## 2011-06-20 LAB — GLUCOSE, CAPILLARY: Glucose-Capillary: 133 mg/dL — ABNORMAL HIGH (ref 70–99)

## 2011-06-24 LAB — GLUCOSE, CAPILLARY: Glucose-Capillary: 129 mg/dL — ABNORMAL HIGH (ref 70–99)

## 2011-07-01 LAB — GLUCOSE, CAPILLARY

## 2011-07-04 LAB — GLUCOSE, CAPILLARY: Glucose-Capillary: 131 mg/dL — ABNORMAL HIGH (ref 70–99)

## 2011-07-08 LAB — GLUCOSE, CAPILLARY
Glucose-Capillary: 120 mg/dL — ABNORMAL HIGH (ref 70–99)
Glucose-Capillary: 115 mg/dL — ABNORMAL HIGH (ref 70–99)

## 2011-07-11 LAB — GLUCOSE, CAPILLARY
Glucose-Capillary: 129 mg/dL — ABNORMAL HIGH (ref 70–99)
Glucose-Capillary: 191 mg/dL — ABNORMAL HIGH (ref 70–99)

## 2011-07-16 LAB — GLUCOSE, CAPILLARY: Glucose-Capillary: 95 mg/dL (ref 70–99)

## 2011-07-18 LAB — GLUCOSE, CAPILLARY

## 2011-07-22 LAB — GLUCOSE, CAPILLARY
Glucose-Capillary: 119 mg/dL — ABNORMAL HIGH (ref 70–99)
Glucose-Capillary: 178 mg/dL — ABNORMAL HIGH (ref 70–99)

## 2011-07-26 LAB — GLUCOSE, CAPILLARY: Glucose-Capillary: 170 mg/dL — ABNORMAL HIGH (ref 70–99)

## 2011-07-29 LAB — GLUCOSE, CAPILLARY: Glucose-Capillary: 118 mg/dL — ABNORMAL HIGH (ref 70–99)

## 2011-08-01 LAB — GLUCOSE, CAPILLARY: Glucose-Capillary: 117 mg/dL — ABNORMAL HIGH (ref 70–99)

## 2011-08-02 LAB — GLUCOSE, CAPILLARY: Glucose-Capillary: 106 mg/dL — ABNORMAL HIGH (ref 70–99)

## 2011-08-05 LAB — GLUCOSE, CAPILLARY
Glucose-Capillary: 124 mg/dL — ABNORMAL HIGH (ref 70–99)
Glucose-Capillary: 138 mg/dL — ABNORMAL HIGH (ref 70–99)

## 2011-08-07 LAB — GLUCOSE, CAPILLARY: Glucose-Capillary: 88 mg/dL (ref 70–99)

## 2011-08-08 LAB — GLUCOSE, CAPILLARY: Glucose-Capillary: 120 mg/dL — ABNORMAL HIGH (ref 70–99)

## 2011-08-19 LAB — GLUCOSE, CAPILLARY
Glucose-Capillary: 139 mg/dL — ABNORMAL HIGH (ref 70–99)
Glucose-Capillary: 191 mg/dL — ABNORMAL HIGH (ref 70–99)

## 2011-08-22 LAB — GLUCOSE, CAPILLARY
Glucose-Capillary: 110 mg/dL — ABNORMAL HIGH (ref 70–99)
Glucose-Capillary: 120 mg/dL — ABNORMAL HIGH (ref 70–99)

## 2011-08-26 LAB — GLUCOSE, CAPILLARY: Glucose-Capillary: 149 mg/dL — ABNORMAL HIGH (ref 70–99)

## 2011-09-05 LAB — GLUCOSE, CAPILLARY: Glucose-Capillary: 131 mg/dL — ABNORMAL HIGH (ref 70–99)

## 2011-09-09 LAB — GLUCOSE, CAPILLARY: Glucose-Capillary: 127 mg/dL — ABNORMAL HIGH (ref 70–99)

## 2011-09-12 LAB — GLUCOSE, CAPILLARY: Glucose-Capillary: 148 mg/dL — ABNORMAL HIGH (ref 70–99)

## 2011-09-16 LAB — GLUCOSE, CAPILLARY: Glucose-Capillary: 121 mg/dL — ABNORMAL HIGH (ref 70–99)

## 2011-09-19 LAB — GLUCOSE, CAPILLARY
Glucose-Capillary: 140 mg/dL — ABNORMAL HIGH (ref 70–99)
Glucose-Capillary: 180 mg/dL — ABNORMAL HIGH (ref 70–99)

## 2011-09-23 LAB — GLUCOSE, CAPILLARY: Glucose-Capillary: 129 mg/dL — ABNORMAL HIGH (ref 70–99)

## 2011-10-03 LAB — GLUCOSE, CAPILLARY
Glucose-Capillary: 131 mg/dL — ABNORMAL HIGH (ref 70–99)
Glucose-Capillary: 166 mg/dL — ABNORMAL HIGH (ref 70–99)

## 2011-10-07 LAB — GLUCOSE, CAPILLARY: Glucose-Capillary: 146 mg/dL — ABNORMAL HIGH (ref 70–99)

## 2011-10-10 LAB — GLUCOSE, CAPILLARY
Glucose-Capillary: 128 mg/dL — ABNORMAL HIGH (ref 70–99)
Glucose-Capillary: 129 mg/dL — ABNORMAL HIGH (ref 70–99)

## 2011-10-14 LAB — GLUCOSE, CAPILLARY: Glucose-Capillary: 137 mg/dL — ABNORMAL HIGH (ref 70–99)

## 2011-10-21 LAB — GLUCOSE, CAPILLARY: Glucose-Capillary: 162 mg/dL — ABNORMAL HIGH (ref 70–99)

## 2011-10-23 LAB — GLUCOSE, CAPILLARY: Glucose-Capillary: 160 mg/dL — ABNORMAL HIGH (ref 70–99)

## 2011-10-24 LAB — GLUCOSE, CAPILLARY: Glucose-Capillary: 125 mg/dL — ABNORMAL HIGH (ref 70–99)

## 2011-10-25 LAB — GLUCOSE, CAPILLARY

## 2011-10-28 LAB — GLUCOSE, CAPILLARY: Glucose-Capillary: 136 mg/dL — ABNORMAL HIGH (ref 70–99)

## 2011-11-06 LAB — GLUCOSE, CAPILLARY: Glucose-Capillary: 106 mg/dL — ABNORMAL HIGH (ref 70–99)

## 2011-11-07 LAB — GLUCOSE, CAPILLARY: Glucose-Capillary: 124 mg/dL — ABNORMAL HIGH (ref 70–99)

## 2011-11-11 LAB — GLUCOSE, CAPILLARY: Glucose-Capillary: 122 mg/dL — ABNORMAL HIGH (ref 70–99)

## 2011-11-14 LAB — GLUCOSE, CAPILLARY
Glucose-Capillary: 128 mg/dL — ABNORMAL HIGH (ref 70–99)
Glucose-Capillary: 166 mg/dL — ABNORMAL HIGH (ref 70–99)

## 2011-11-28 LAB — GLUCOSE, CAPILLARY
Glucose-Capillary: 122 mg/dL — ABNORMAL HIGH (ref 70–99)
Glucose-Capillary: 135 mg/dL — ABNORMAL HIGH (ref 70–99)

## 2011-12-02 LAB — GLUCOSE, CAPILLARY: Glucose-Capillary: 135 mg/dL — ABNORMAL HIGH (ref 70–99)

## 2011-12-05 LAB — GLUCOSE, CAPILLARY
Glucose-Capillary: 123 mg/dL — ABNORMAL HIGH (ref 70–99)
Glucose-Capillary: 174 mg/dL — ABNORMAL HIGH (ref 70–99)

## 2011-12-16 LAB — GLUCOSE, CAPILLARY: Glucose-Capillary: 132 mg/dL — ABNORMAL HIGH (ref 70–99)

## 2011-12-18 LAB — GLUCOSE, CAPILLARY: Glucose-Capillary: 141 mg/dL — ABNORMAL HIGH (ref 70–99)

## 2011-12-23 LAB — GLUCOSE, CAPILLARY

## 2011-12-24 LAB — GLUCOSE, CAPILLARY: Glucose-Capillary: 142 mg/dL — ABNORMAL HIGH (ref 70–99)

## 2011-12-26 LAB — GLUCOSE, CAPILLARY
Glucose-Capillary: 133 mg/dL — ABNORMAL HIGH (ref 70–99)
Glucose-Capillary: 126 mg/dL — ABNORMAL HIGH (ref 70–99)

## 2011-12-30 LAB — GLUCOSE, CAPILLARY: Glucose-Capillary: 158 mg/dL — ABNORMAL HIGH (ref 70–99)

## 2012-01-06 LAB — GLUCOSE, CAPILLARY: Glucose-Capillary: 161 mg/dL — ABNORMAL HIGH (ref 70–99)

## 2012-01-09 LAB — GLUCOSE, CAPILLARY: Glucose-Capillary: 250 mg/dL — ABNORMAL HIGH (ref 70–99)

## 2012-01-13 LAB — GLUCOSE, CAPILLARY: Glucose-Capillary: 135 mg/dL — ABNORMAL HIGH (ref 70–99)

## 2012-01-16 LAB — GLUCOSE, CAPILLARY: Glucose-Capillary: 128 mg/dL — ABNORMAL HIGH (ref 70–99)

## 2012-01-23 LAB — GLUCOSE, CAPILLARY: Glucose-Capillary: 149 mg/dL — ABNORMAL HIGH (ref 70–99)

## 2012-01-27 LAB — GLUCOSE, CAPILLARY: Glucose-Capillary: 206 mg/dL — ABNORMAL HIGH (ref 70–99)

## 2012-01-30 LAB — GLUCOSE, CAPILLARY
Glucose-Capillary: 124 mg/dL — ABNORMAL HIGH (ref 70–99)
Glucose-Capillary: 150 mg/dL — ABNORMAL HIGH (ref 70–99)

## 2012-02-03 LAB — GLUCOSE, CAPILLARY: Glucose-Capillary: 185 mg/dL — ABNORMAL HIGH (ref 70–99)

## 2012-02-10 LAB — GLUCOSE, CAPILLARY
Glucose-Capillary: 131 mg/dL — ABNORMAL HIGH (ref 70–99)
Glucose-Capillary: 117 mg/dL — ABNORMAL HIGH (ref 70–99)

## 2012-02-17 LAB — GLUCOSE, CAPILLARY
Glucose-Capillary: 127 mg/dL — ABNORMAL HIGH (ref 70–99)
Glucose-Capillary: 104 mg/dL — ABNORMAL HIGH (ref 70–99)

## 2012-02-21 LAB — GLUCOSE, CAPILLARY: Glucose-Capillary: 125 mg/dL — ABNORMAL HIGH (ref 70–99)

## 2012-02-24 LAB — GLUCOSE, CAPILLARY: Glucose-Capillary: 133 mg/dL — ABNORMAL HIGH (ref 70–99)

## 2012-03-02 LAB — GLUCOSE, CAPILLARY: Glucose-Capillary: 129 mg/dL — ABNORMAL HIGH (ref 70–99)

## 2012-03-05 LAB — GLUCOSE, CAPILLARY: Glucose-Capillary: 100 mg/dL — ABNORMAL HIGH (ref 70–99)

## 2012-03-19 LAB — GLUCOSE, CAPILLARY: Glucose-Capillary: 121 mg/dL — ABNORMAL HIGH (ref 70–99)

## 2012-03-20 LAB — GLUCOSE, CAPILLARY: Glucose-Capillary: 130 mg/dL — ABNORMAL HIGH (ref 70–99)

## 2012-03-24 LAB — GLUCOSE, CAPILLARY
Glucose-Capillary: 127 mg/dL — ABNORMAL HIGH (ref 70–99)
Glucose-Capillary: 106 mg/dL — ABNORMAL HIGH (ref 70–99)

## 2012-03-30 LAB — GLUCOSE, CAPILLARY: Glucose-Capillary: 88 mg/dL (ref 70–99)

## 2012-04-03 LAB — GLUCOSE, CAPILLARY: Glucose-Capillary: 158 mg/dL — ABNORMAL HIGH (ref 70–99)

## 2012-04-09 ENCOUNTER — Ambulatory Visit (HOSPITAL_COMMUNITY)
Admit: 2012-04-09 | Discharge: 2012-04-09 | Disposition: A | Discharge status: Home / Self Care | Payer: Medicare Other | Source: Ambulatory Visit | Attending: Internal Medicine | Admitting: Internal Medicine

## 2012-04-09 DIAGNOSIS — R059 Cough, unspecified: Secondary | ICD-10-CM | POA: Insufficient documentation

## 2012-04-09 DIAGNOSIS — R05 Cough: Secondary | ICD-10-CM | POA: Insufficient documentation

## 2012-04-09 LAB — GLUCOSE, CAPILLARY: Glucose-Capillary: 114 mg/dL — ABNORMAL HIGH (ref 70–99)

## 2012-04-16 LAB — GLUCOSE, CAPILLARY
Glucose-Capillary: 113 mg/dL — ABNORMAL HIGH (ref 70–99)
Glucose-Capillary: 125 mg/dL — ABNORMAL HIGH (ref 70–99)

## 2012-04-17 LAB — GLUCOSE, CAPILLARY: Glucose-Capillary: 136 mg/dL — ABNORMAL HIGH (ref 70–99)

## 2012-04-24 LAB — GLUCOSE, CAPILLARY: Glucose-Capillary: 90 mg/dL (ref 70–99)

## 2012-04-27 LAB — GLUCOSE, CAPILLARY

## 2012-04-30 LAB — GLUCOSE, CAPILLARY: Glucose-Capillary: 120 mg/dL — ABNORMAL HIGH (ref 70–99)

## 2012-05-04 LAB — GLUCOSE, CAPILLARY
Glucose-Capillary: 114 mg/dL — ABNORMAL HIGH (ref 70–99)
Glucose-Capillary: 118 mg/dL — ABNORMAL HIGH (ref 70–99)

## 2012-05-07 LAB — GLUCOSE, CAPILLARY

## 2012-05-14 LAB — GLUCOSE, CAPILLARY
Glucose-Capillary: 115 mg/dL — ABNORMAL HIGH (ref 70–99)
Glucose-Capillary: 123 mg/dL — ABNORMAL HIGH (ref 70–99)

## 2012-05-18 LAB — GLUCOSE, CAPILLARY

## 2012-05-21 LAB — GLUCOSE, CAPILLARY: Glucose-Capillary: 118 mg/dL — ABNORMAL HIGH (ref 70–99)

## 2012-05-25 LAB — GLUCOSE, CAPILLARY: Glucose-Capillary: 92 mg/dL (ref 70–99)

## 2012-05-28 LAB — GLUCOSE, CAPILLARY: Glucose-Capillary: 108 mg/dL — ABNORMAL HIGH (ref 70–99)

## 2012-06-04 LAB — GLUCOSE, CAPILLARY: Glucose-Capillary: 123 mg/dL — ABNORMAL HIGH (ref 70–99)

## 2012-06-11 LAB — GLUCOSE, CAPILLARY
Glucose-Capillary: 114 mg/dL — ABNORMAL HIGH (ref 70–99)
Glucose-Capillary: 138 mg/dL — ABNORMAL HIGH (ref 70–99)

## 2012-06-15 LAB — GLUCOSE, CAPILLARY: Glucose-Capillary: 113 mg/dL — ABNORMAL HIGH (ref 70–99)

## 2012-06-17 LAB — GLUCOSE, CAPILLARY: Glucose-Capillary: 240 mg/dL — ABNORMAL HIGH (ref 70–99)

## 2012-06-29 LAB — GLUCOSE, CAPILLARY: Glucose-Capillary: 149 mg/dL — ABNORMAL HIGH (ref 70–99)

## 2012-07-02 LAB — GLUCOSE, CAPILLARY: Glucose-Capillary: 130 mg/dL — ABNORMAL HIGH (ref 70–99)

## 2012-07-06 LAB — GLUCOSE, CAPILLARY
Glucose-Capillary: 130 mg/dL — ABNORMAL HIGH (ref 70–99)
Glucose-Capillary: 124 mg/dL — ABNORMAL HIGH (ref 70–99)

## 2012-07-13 LAB — GLUCOSE, CAPILLARY: Glucose-Capillary: 105 mg/dL — ABNORMAL HIGH (ref 70–99)

## 2012-07-16 LAB — GLUCOSE, CAPILLARY: Glucose-Capillary: 154 mg/dL — ABNORMAL HIGH (ref 70–99)

## 2012-07-16 IMAGING — CR DG CHEST 1V PORT
1 series · 1 of 1 positions shown · non-contrast
Comparison: 01/29/2011

CLINICAL DATA: Cough.  Shortness of breath.  Weakness.  Rule out
pneumonia.

PORTABLE CHEST - 1 VIEW

[view not recorded]
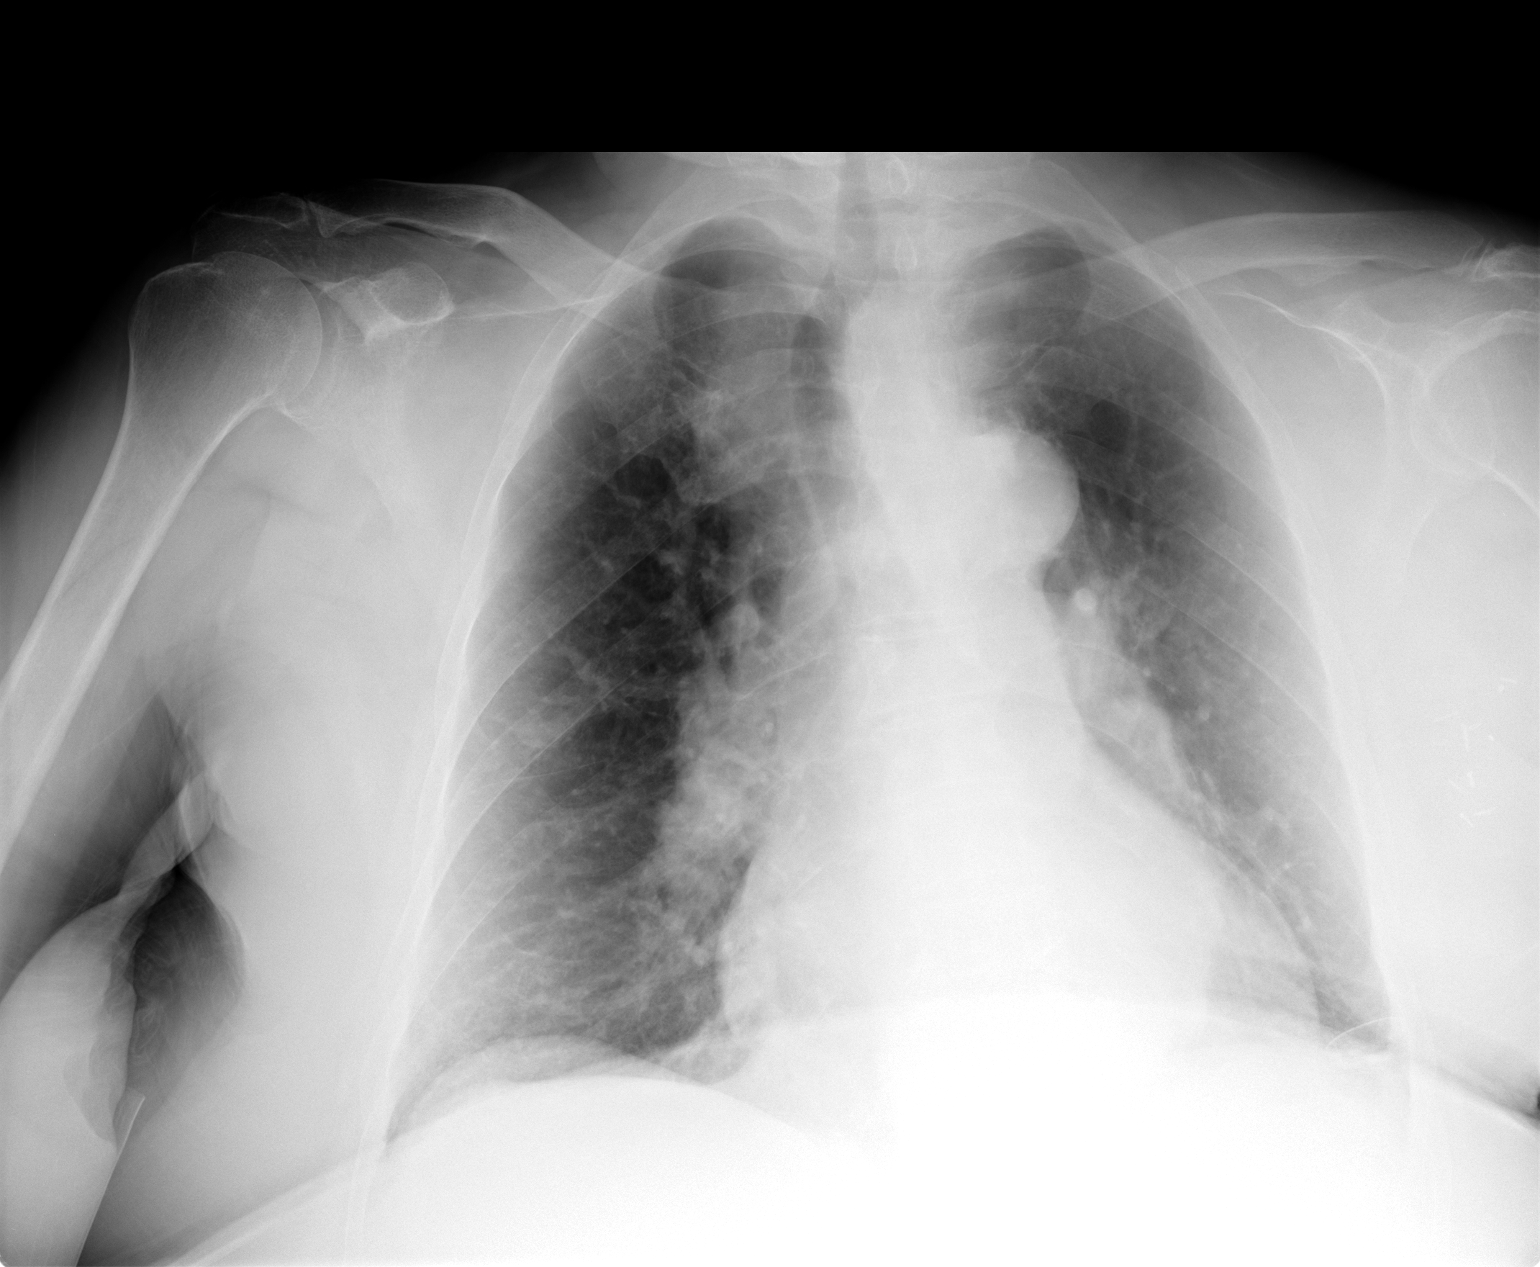

[1 of 1 positions shown; findings below may reference images not displayed]

FINDINGS: Mild hyperinflation.  Patient rotated right.  Mild
cardiomegaly.  Tortuous descending thoracic aorta. No pleural
effusion or pneumothorax.  Surgical clips within the left axilla or
lateral left breast.  Artifact projects at the left costophrenic
angle.  Mild bibasilar scarring. No congestive failure.
IMPRESSION: 1.  No evidence of pneumonia.
2.  Cardiomegaly and mild hyperinflation.

## 2012-07-16 IMAGING — CT CT HEAD W/O CM
1 series · 16 of 30 positions shown, 20 images · non-contrast
Comparison: 10/17/2008

CLINICAL DATA: Hypertension and headache

CT HEAD WITHOUT CONTRAST
TECHNIQUE: Contiguous axial images were obtained from the base of
the skull through the vertex without contrast.

[Series 6: headseq 4.8 h37s · axial · 0.48mm/px · z∈[+322,+474]mm · 16 of 36 slices shown, 20 images]
[im 2/36  brain]
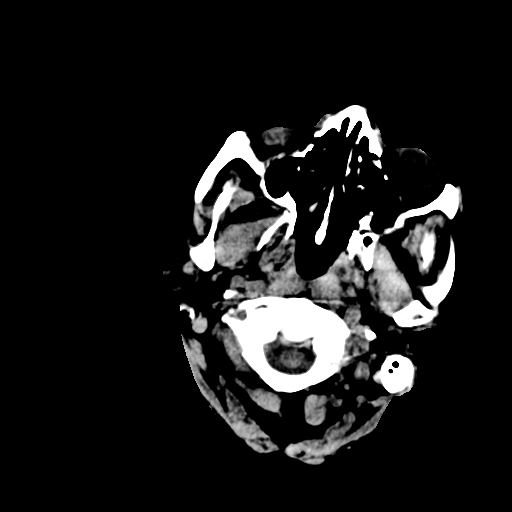
[im 2/36  bone]
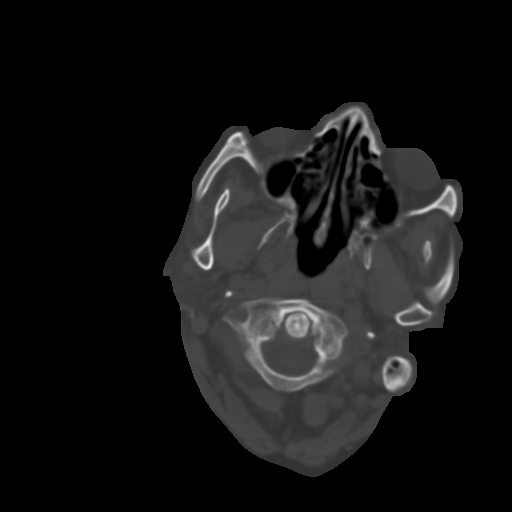
[im 4/36  brain]
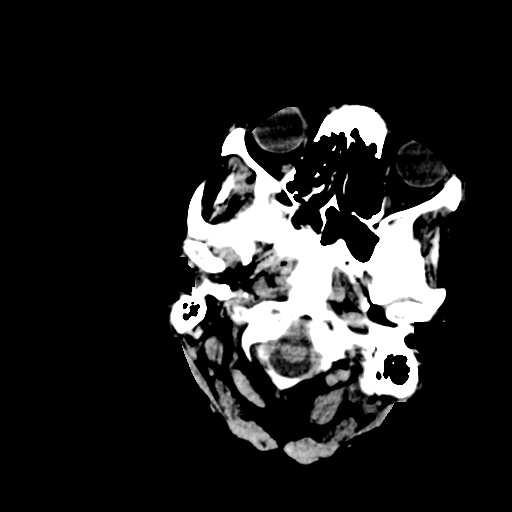
[im 7/36  brain]
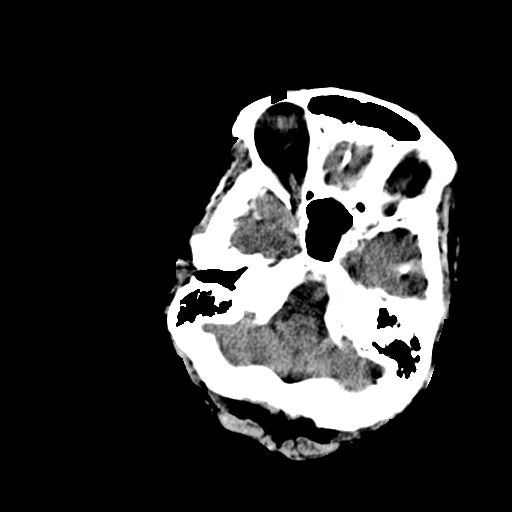
[im 9/36  brain]
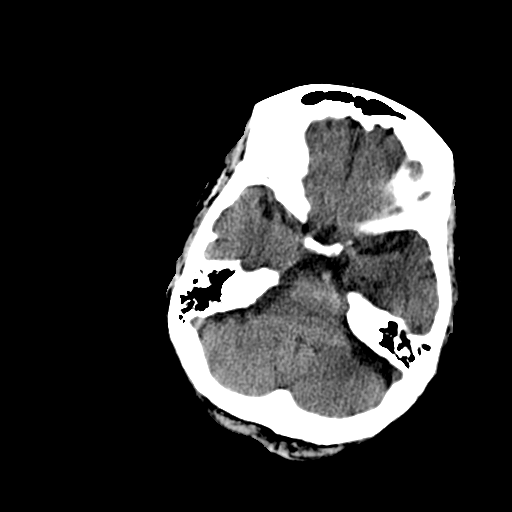
[im 10/36  brain]
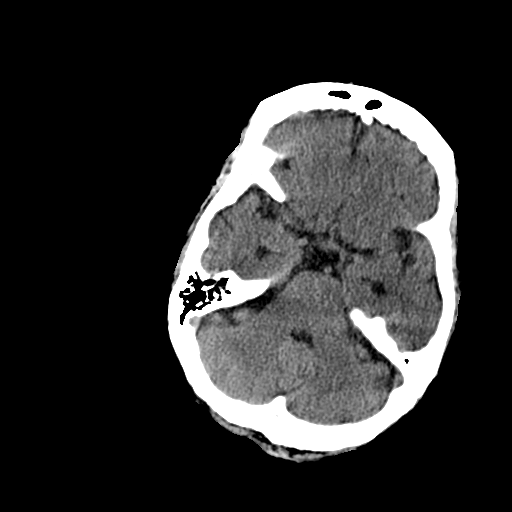
[im 10/36  bone]
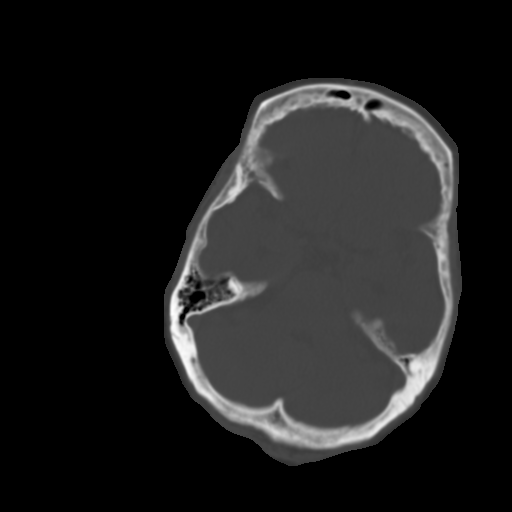
[im 13/36  brain]
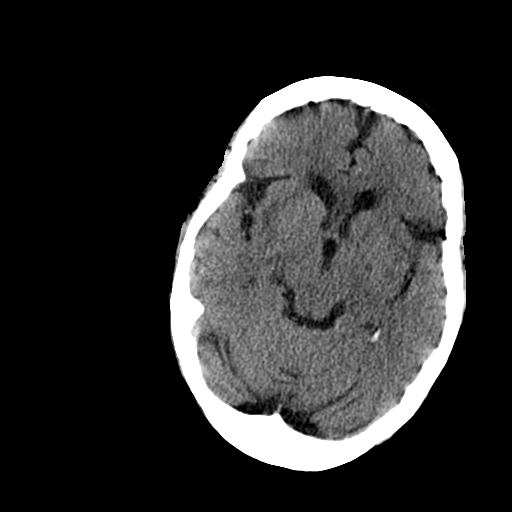
[im 15/36  brain]
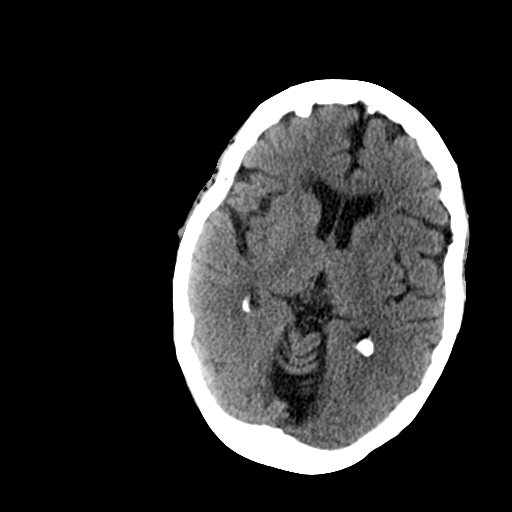
[im 17/36  brain]
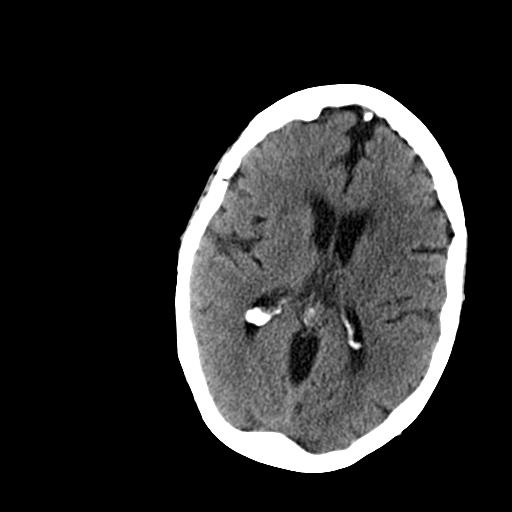
[im 19/36  brain]
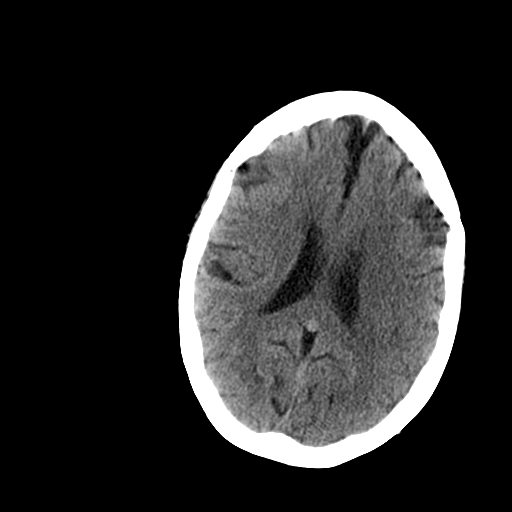
[im 19/36  bone]
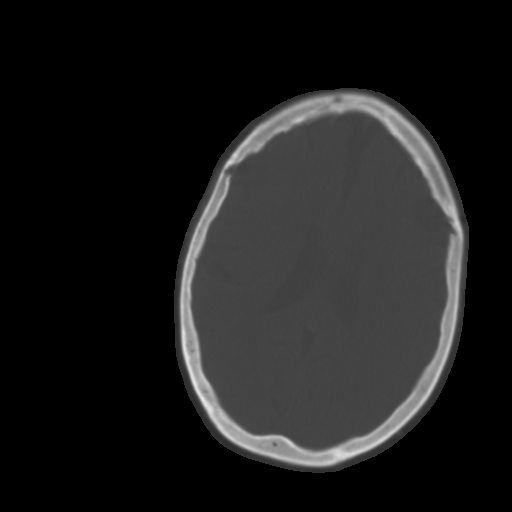
[im 21/36  brain]
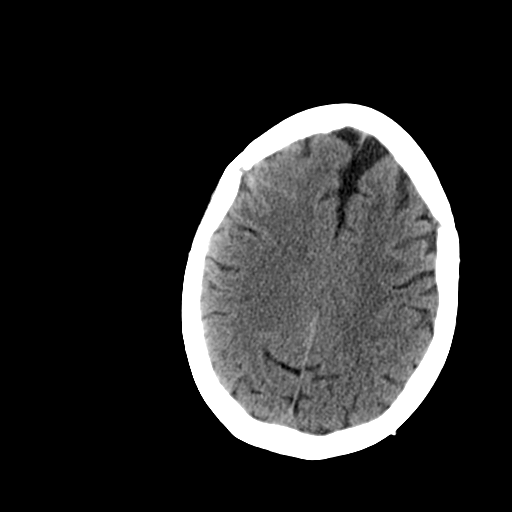
[im 23/36  brain]
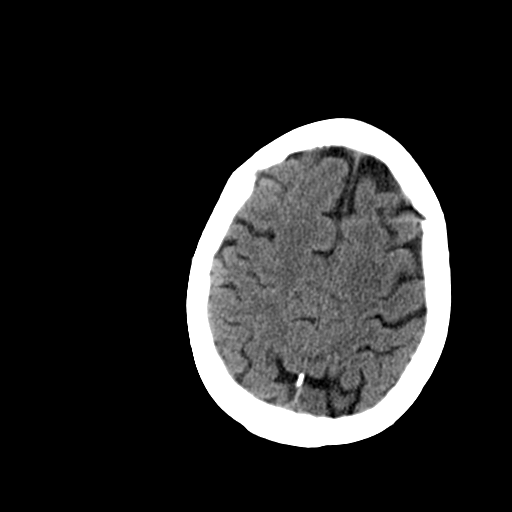
[im 26/36  brain]
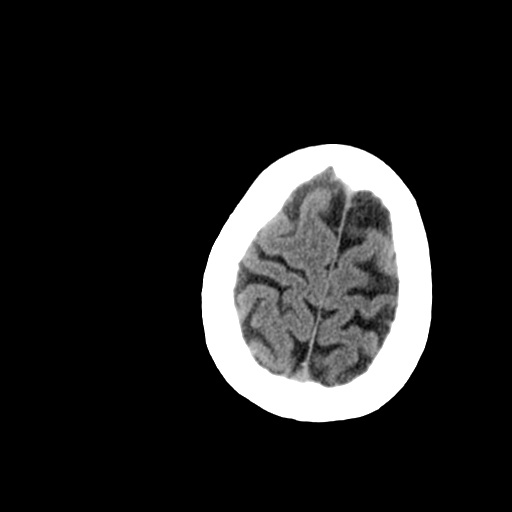
[im 27/36  brain]
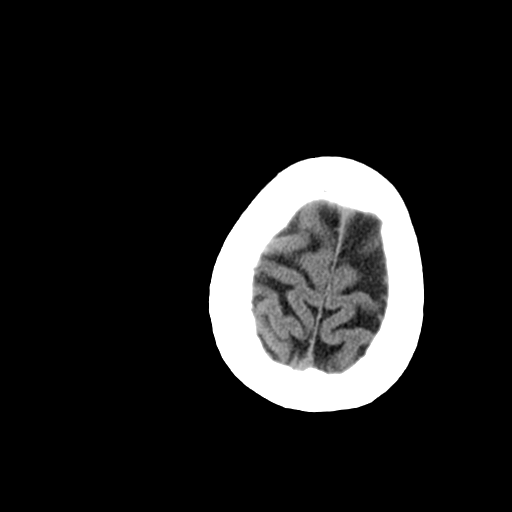
[im 27/36  bone]
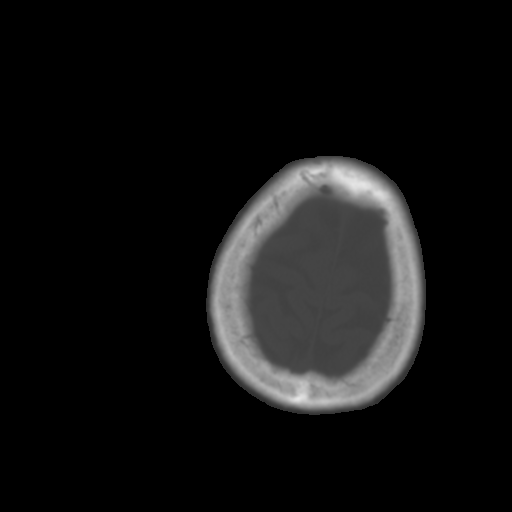
[im 29/36  brain]
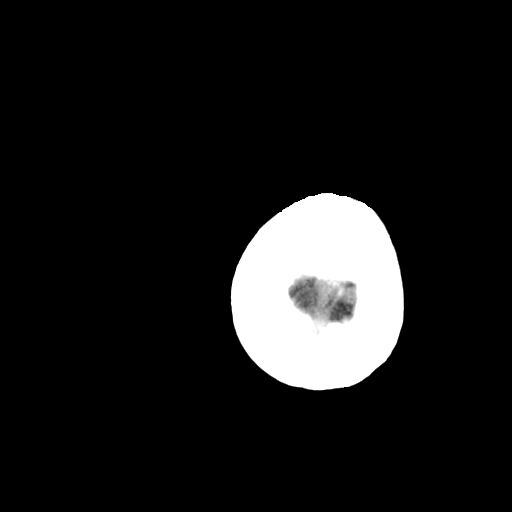
[im 32/36  brain]
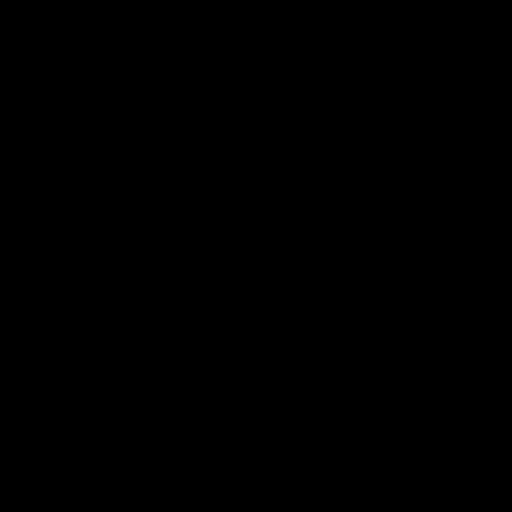
[im 34/36  brain]
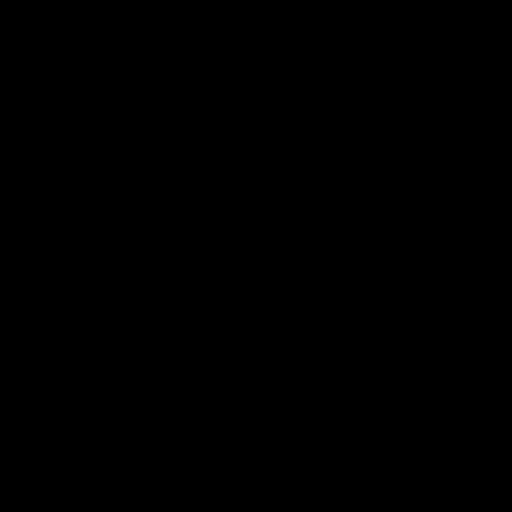

[16 of 30 positions shown; findings below may reference images not displayed]

FINDINGS: Mild diffuse cerebral atrophy.  Low attenuation changes
in the deep white matter consistent with small vessel ischemia.
Ventricles and sulci are otherwise symmetrical without mass effect
or midline shift.  No abnormal extra-axial fluid collections.  The
gray-white matter junctions are distinct.  The basal cisterns are
not effaced.  No evidence of acute intracranial hemorrhage.  No
displaced skull fractures.  Vascular calcifications.  Opacification
of some of the right posterior ethmoid air cells.  No significant
changes since the previous study.
IMPRESSION: Diffuse atrophy and small vessel ischemic changes.  No evidence of
acute intracranial hemorrhage, mass lesion, or acute infarct.

## 2012-07-20 LAB — GLUCOSE, CAPILLARY
Glucose-Capillary: 140 mg/dL — ABNORMAL HIGH (ref 70–99)
Glucose-Capillary: 147 mg/dL — ABNORMAL HIGH (ref 70–99)

## 2012-07-20 IMAGING — CR DG CHEST 1V
1 series · 1 of 1 positions shown · non-contrast
Comparison: 03/26/2011, 01/29/2011 and 08/13/2009..

CLINICAL DATA: Pneumonia.

CHEST - 1 VIEW

[view not recorded]
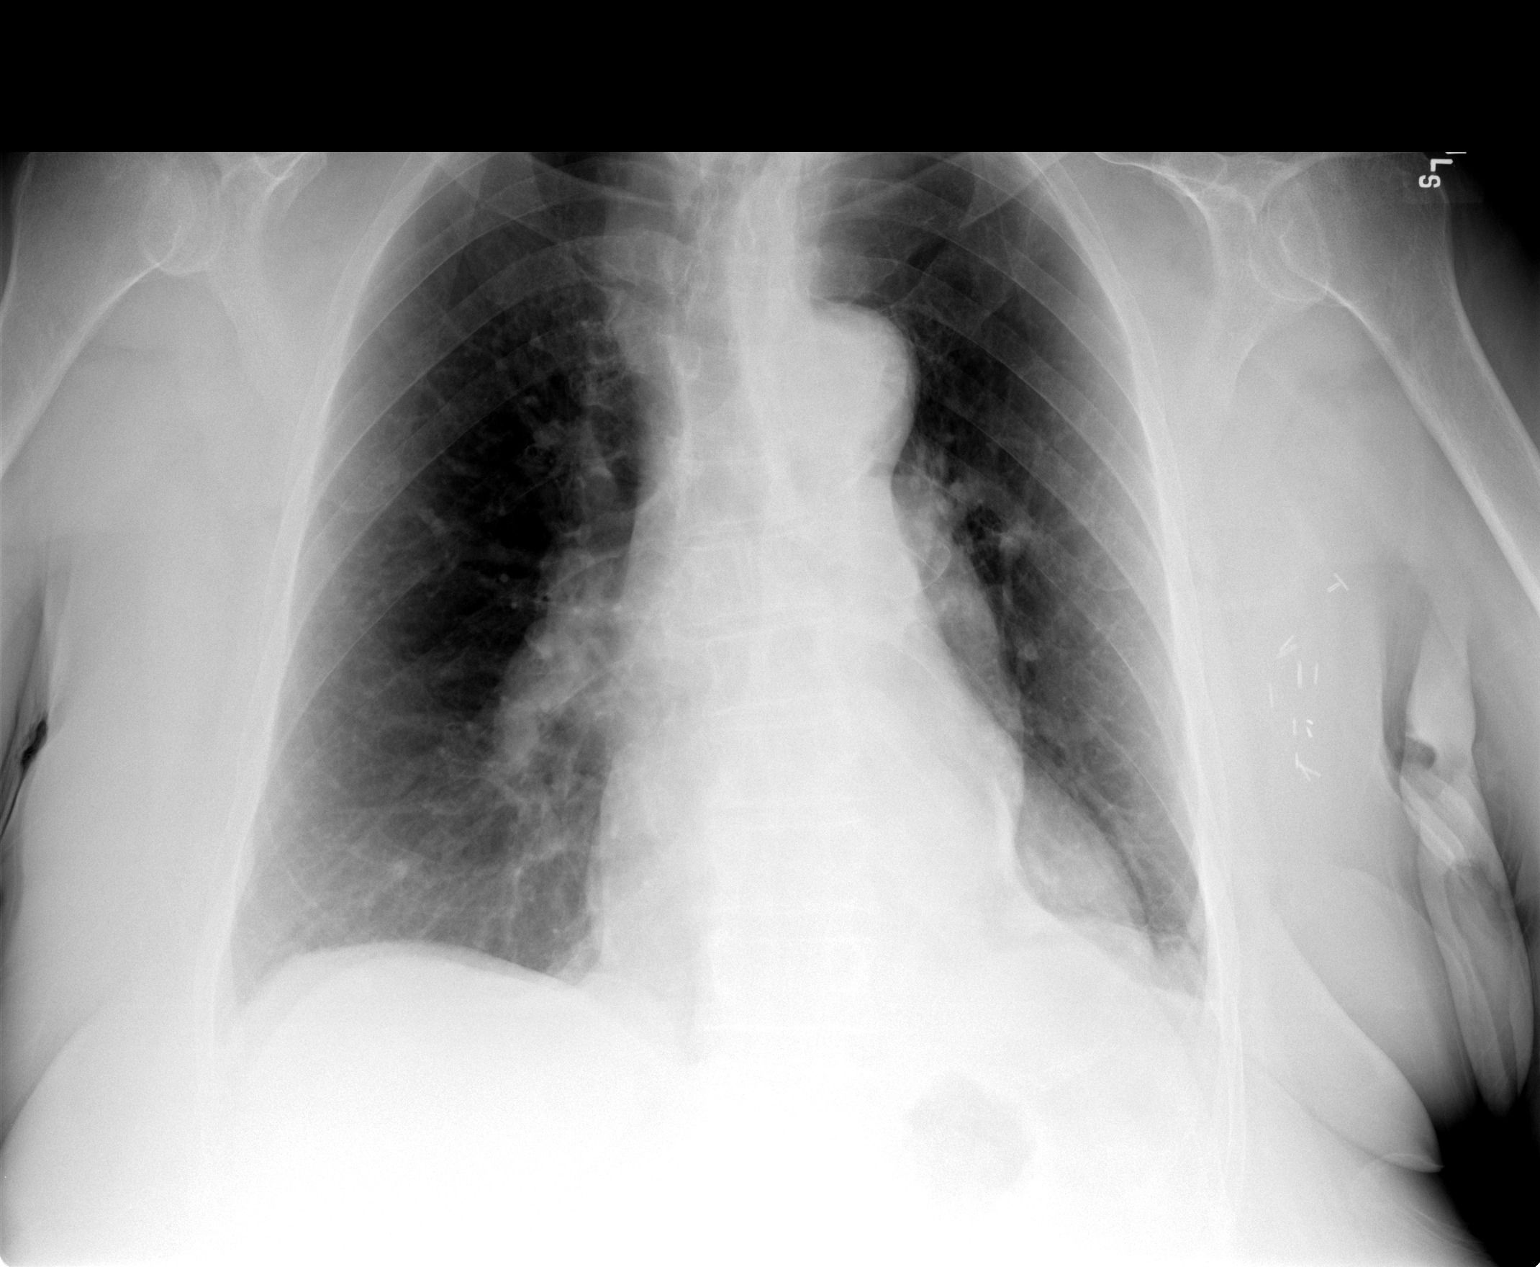

[1 of 1 positions shown; findings below may reference images not displayed]

FINDINGS: Scarring left base.  No segmental consolidation.

Central pulmonary vascular prominence.  Mild cardiomegaly.  Mildly
tortuous aorta..
IMPRESSION: Scarring left base without segmental consolidation.

## 2012-07-30 LAB — GLUCOSE, CAPILLARY: Glucose-Capillary: 144 mg/dL — ABNORMAL HIGH (ref 70–99)

## 2012-08-03 LAB — GLUCOSE, CAPILLARY
Glucose-Capillary: 139 mg/dL — ABNORMAL HIGH (ref 70–99)
Glucose-Capillary: 98 mg/dL (ref 70–99)

## 2012-08-17 LAB — GLUCOSE, CAPILLARY: Glucose-Capillary: 147 mg/dL — ABNORMAL HIGH (ref 70–99)

## 2012-08-20 LAB — GLUCOSE, CAPILLARY: Glucose-Capillary: 139 mg/dL — ABNORMAL HIGH (ref 70–99)

## 2012-08-24 LAB — GLUCOSE, CAPILLARY: Glucose-Capillary: 122 mg/dL — ABNORMAL HIGH (ref 70–99)

## 2012-08-27 LAB — GLUCOSE, CAPILLARY
Glucose-Capillary: 129 mg/dL — ABNORMAL HIGH (ref 70–99)
Glucose-Capillary: 169 mg/dL — ABNORMAL HIGH (ref 70–99)

## 2012-08-30 ENCOUNTER — Non-Acute Institutional Stay (SKILLED_NURSING_FACILITY): Payer: Medicare Other | Admitting: Internal Medicine

## 2012-08-30 DIAGNOSIS — I10 Essential (primary) hypertension: Secondary | ICD-10-CM

## 2012-08-30 DIAGNOSIS — Z86718 Personal history of other venous thrombosis and embolism: Secondary | ICD-10-CM

## 2012-08-30 DIAGNOSIS — F068 Other specified mental disorders due to known physiological condition: Secondary | ICD-10-CM

## 2012-08-30 DIAGNOSIS — G609 Hereditary and idiopathic neuropathy, unspecified: Secondary | ICD-10-CM

## 2012-08-30 DIAGNOSIS — E785 Hyperlipidemia, unspecified: Secondary | ICD-10-CM

## 2012-08-30 DIAGNOSIS — K219 Gastro-esophageal reflux disease without esophagitis: Secondary | ICD-10-CM

## 2012-08-30 DIAGNOSIS — E039 Hypothyroidism, unspecified: Secondary | ICD-10-CM

## 2012-08-30 DIAGNOSIS — M479 Spondylosis, unspecified: Secondary | ICD-10-CM

## 2012-08-30 DIAGNOSIS — I739 Peripheral vascular disease, unspecified: Secondary | ICD-10-CM

## 2012-08-30 DIAGNOSIS — E119 Type 2 diabetes mellitus without complications: Secondary | ICD-10-CM

## 2012-08-30 NOTE — Progress Notes (Signed)
Patient ID: Brandi Rice, female   DOB: Nov 16, 1921, 79 y.o.   MRN: [id number]  This is a routine visit.  Level of care skilled.  Facility-[facility].  Chief complaint-medical management of anemia hyperlipidemia dementia diabetes neuropathy edema hypothyroidism peripheral vascular disease.  History of present illness patient is a very pleasant elderly resident with the above diagnosis-she continues to be quite stable.  Has not really had any significant acute recent issues to my knowledge she denies any as well.  Her weight has some variability but does not appear to be grossly changed from her baseline she initially was 247 in December went down to 54 in March now she is back up to 241-she does eat well she does have a history of eating snacks  He does have a history of lower extremity edema this appears to be stable she is on low dose Lasix as well as potassium.  She does have a history of pulmonary embolism this has been revoked that she is on aspirin and couple.  She's also with a history of peripheral vascular disease.  She also has a listed history of hypertension blood pressures are stable largely systolics in the [id number] diastolics in the [id number] is on lisinopril.  Also a history of peripheral neuropathy that is stable on Neurontin.  She is a type II diabetic on Glucophage her blood sugars appear to be satisfactory in the low 100s.  TSH back done in March was within normal limits at 1.99-she is on Synthroid with a listed history of hypothyroidism.  Family medical social history has been reviewed her discharge summary on 10/28/2008.  Medications have been reviewed per MAR.  Review of systems.  General she is denying any fever or chills her weight has been relatively stable.  Skin-denies issues.  Head ears eyes nose mouth and throat-does not complaining of sore throat nasal discharge or visual changes.  Respiratory-no complaints of shortness of breath or cough.  Cardiac-does not  in her chest pain has some baseline lower extremity edema.  GI-denies any nausea vomiting diarrhea constipation or abdominal pain.  GU no complaints of dysuria.  Muscle skeletal-has complained of some leg pain in the past but does not complaining of that tonight the Neurontin appears to help.  Physical exam.  Temperature is 98.8 pulse 75 respirations 20 pressure 124/97-123/77 most recently weight is 241 this is relatively stable.  In general this is a well-nourished elderly female in no distress sitting comfortably in her chair.  Her skin is warm and dry.  Eyes pupils equal round react to light sclera and conjunctiva are clear.  Chest is clear to auscultation without rhonchi rales or wheezes.  Heart regular rate and rhythm with a mild [id number] systolic murmur-she has baseline lower extremity edema obese changes I will see 2+.  Abdomen is soft nontender with positive bowel sounds it is obese.  Muscle skeletal moves all extremities at baseline ambulates at times with a walker this appears relatively unchanged from previous exams-.  Neurologic-grossly intact speech is clear gas there are no lateralizing findings.  Labs.  08/01/2012.  [id number].  2- 24th 2014.  WBC 4.7 hemoglobin 10.9 platelets 233.  Sodium 141 potassium 4.2 BUN 9 creatinine 0.88  05/14/2012.  Cholesterol 150 triglycerides 109 HDL 37 LDL 91.  03/07/2012.  Hemoglobin A1c 7.1.  Assessment and plan.  #1-peripheral vascular disease-this appears stable she is on Pletal as well as aspirin.  #2-anemia-this is been stable Will update CBC.  #3 hypothyroidism this appears stable on Synthroid  recent TSH within normal limits.  #4-dementia-appears stable this is quite mild she is on Namenda as well as Aricept-she continues to function quite well in this setting.  Numbers 5-history of pulmonary embolism-this is remote there is been no recurrence she is on anticoagulation aspirin and plateau.  #6 status  hypertension-appears stable she is on lisinopril Will update metabolic panel.  #7-hyperlipidemia this appears stable per recent labs she is on pravastatin-Will update liver function tests.  #8-diabetes type 2-this appears stable per CBGs will update hemoglobin A1c.  #9-edema-appears stable she is on low dose Lasix with potassium Will update metabolic panel.  #10 -- history of neuropathy-is on Neurontin this appears to be stable.  #11-history of osteoporosis she is on calcium supplementation.  #12-history of GERD she is on Zantac and this is stable as well  History of osteoarthritis-she is stable she does receive Tylenol for pain which appears to be effective.  [id number] note 30 minutes spent assessing patient and formulating plan of care for numerous diagnoses

## 2012-08-31 LAB — GLUCOSE, CAPILLARY: Glucose-Capillary: 130 mg/dL — ABNORMAL HIGH (ref 70–99)

## 2012-09-03 ENCOUNTER — Other Ambulatory Visit: Payer: Self-pay | Admitting: *Deleted

## 2012-09-04 ENCOUNTER — Non-Acute Institutional Stay (SKILLED_NURSING_FACILITY): Payer: Medicare Other | Admitting: Internal Medicine

## 2012-09-04 DIAGNOSIS — K219 Gastro-esophageal reflux disease without esophagitis: Secondary | ICD-10-CM

## 2012-09-04 DIAGNOSIS — R143 Flatulence: Secondary | ICD-10-CM

## 2012-09-04 DIAGNOSIS — IMO0001 Reserved for inherently not codable concepts without codable children: Secondary | ICD-10-CM

## 2012-09-04 NOTE — Progress Notes (Signed)
Patient ID: Brandi Rice, female   DOB: May 09, 1921, 79 y.o.   MRN: [id number]  This is an acute visit.  Level of care skilled.  Facility [facility].  Chief complaint-acute visit secondary to left side   pain-  History of present illness.  Patient is a pleasant elderly resident who complained of some left-sided cramps while eating this evening.  She says this occurs fairly frequently usually associated with eating and that when she belches there is relief.  She does have a history of GERD --she is on Zantac.   She denies any associated chest pain shortness of breath dizziness or headache-denies any nausea or vomiting as well.  Family medical social history has been reviewed.  Medications at been reviewed.  Review of systems.  Gen. does not complaining of fever chills.  Respirate is not complaining of shortness of breath.  Cardiac-no chest pain.  GI-as stated in history of present illness.  Muscle skeletal does not complaining of joint pain just left-sided cramping pain relieved by belching  Physical exam  She is afebrile pulse of 80 respirations of 21.  In general this is a somewhat obese elderly female somewhat uncomfortable initially when I saw her but after she had numerous episodes of belching pain appeared to be essentially resolved.  Skin is warm and dry.  Chest is clear to auscultation without rhonchi rales or wheezes no labored breathing.  Heart-regular rate and rhythm without murmur gallop or rub.  Abdomen is soft with positive bowel sounds nontender  Muscle skeletal-there is not really acute tenderness to deep palpation on the left thorax area-she describes it more as a bloated-type cramping feeling that when she belches there is almost immediate relief--and that did t appear to be the case today-- pain was completely resolved after several episodes of burping.  .  Labs.  08/01/2012.  TSH-1.991.  2 a 24 2014.  WBC 4.7 hemoglobin 10.9 platelets  233.  Sodium 141 potassium 4.2 BUN 9 creatinine 0.88.  Assessment and plan. Marland Kitchen #1-left sided pain-most likely secondary to gas-GERD-like symptoms-Will change Zantac to Prilosec-also will order simethicone when necessary to see if this helps as well.  Also will monitor vital signs pulse ox every shift for 24 hours to keep an eye on this-  [id number]

## 2012-09-07 LAB — GLUCOSE, CAPILLARY: Glucose-Capillary: 168 mg/dL — ABNORMAL HIGH (ref 70–99)

## 2012-09-11 LAB — GLUCOSE, CAPILLARY: Glucose-Capillary: 174 mg/dL — ABNORMAL HIGH (ref 70–99)

## 2012-09-14 LAB — GLUCOSE, CAPILLARY
Glucose-Capillary: 160 mg/dL — ABNORMAL HIGH (ref 70–99)
Glucose-Capillary: 171 mg/dL — ABNORMAL HIGH (ref 70–99)

## 2012-09-17 LAB — GLUCOSE, CAPILLARY: Glucose-Capillary: 131 mg/dL — ABNORMAL HIGH (ref 70–99)

## 2012-09-24 LAB — GLUCOSE, CAPILLARY
Glucose-Capillary: 148 mg/dL — ABNORMAL HIGH (ref 70–99)
Glucose-Capillary: 128 mg/dL — ABNORMAL HIGH (ref 70–99)

## 2012-10-01 LAB — GLUCOSE, CAPILLARY: Glucose-Capillary: 128 mg/dL — ABNORMAL HIGH (ref 70–99)

## 2012-10-03 LAB — GLUCOSE, CAPILLARY: Glucose-Capillary: 120 mg/dL — ABNORMAL HIGH (ref 70–99)

## 2012-10-05 LAB — GLUCOSE, CAPILLARY: Glucose-Capillary: 119 mg/dL — ABNORMAL HIGH (ref 70–99)

## 2012-10-08 LAB — GLUCOSE, CAPILLARY: Glucose-Capillary: 138 mg/dL — ABNORMAL HIGH (ref 70–99)

## 2012-10-12 LAB — GLUCOSE, CAPILLARY: Glucose-Capillary: 162 mg/dL — ABNORMAL HIGH (ref 70–99)

## 2012-10-22 LAB — GLUCOSE, CAPILLARY

## 2012-10-26 LAB — GLUCOSE, CAPILLARY
Glucose-Capillary: 122 mg/dL — ABNORMAL HIGH (ref 70–99)
Glucose-Capillary: 101 mg/dL — ABNORMAL HIGH (ref 70–99)

## 2012-10-29 LAB — GLUCOSE, CAPILLARY: Glucose-Capillary: 125 mg/dL — ABNORMAL HIGH (ref 70–99)

## 2012-11-02 LAB — GLUCOSE, CAPILLARY: Glucose-Capillary: 131 mg/dL — ABNORMAL HIGH (ref 70–99)

## 2012-11-03 LAB — GLUCOSE, CAPILLARY: Glucose-Capillary: 158 mg/dL — ABNORMAL HIGH (ref 70–99)

## 2012-11-05 LAB — GLUCOSE, CAPILLARY: Glucose-Capillary: 117 mg/dL — ABNORMAL HIGH (ref 70–99)

## 2012-11-09 LAB — GLUCOSE, CAPILLARY: Glucose-Capillary: 127 mg/dL — ABNORMAL HIGH (ref 70–99)

## 2012-11-12 LAB — GLUCOSE, CAPILLARY

## 2012-11-19 LAB — GLUCOSE, CAPILLARY: Glucose-Capillary: 109 mg/dL — ABNORMAL HIGH (ref 70–99)

## 2012-11-23 LAB — GLUCOSE, CAPILLARY: Glucose-Capillary: 131 mg/dL — ABNORMAL HIGH (ref 70–99)

## 2012-11-26 LAB — GLUCOSE, CAPILLARY: Glucose-Capillary: 144 mg/dL — ABNORMAL HIGH (ref 70–99)

## 2012-11-27 LAB — GLUCOSE, CAPILLARY: Glucose-Capillary: 119 mg/dL — ABNORMAL HIGH (ref 70–99)

## 2012-12-03 LAB — GLUCOSE, CAPILLARY: Glucose-Capillary: 132 mg/dL — ABNORMAL HIGH (ref 70–99)

## 2012-12-07 LAB — GLUCOSE, CAPILLARY: Glucose-Capillary: 130 mg/dL — ABNORMAL HIGH (ref 70–99)

## 2012-12-11 ENCOUNTER — Non-Acute Institutional Stay (SKILLED_NURSING_FACILITY): Payer: Medicare Other | Admitting: Internal Medicine

## 2012-12-11 DIAGNOSIS — F068 Other specified mental disorders due to known physiological condition: Secondary | ICD-10-CM

## 2012-12-11 DIAGNOSIS — Z86718 Personal history of other venous thrombosis and embolism: Secondary | ICD-10-CM

## 2012-12-11 DIAGNOSIS — R609 Edema, unspecified: Secondary | ICD-10-CM

## 2012-12-11 DIAGNOSIS — K219 Gastro-esophageal reflux disease without esophagitis: Secondary | ICD-10-CM

## 2012-12-11 DIAGNOSIS — E119 Type 2 diabetes mellitus without complications: Secondary | ICD-10-CM

## 2012-12-11 DIAGNOSIS — I10 Essential (primary) hypertension: Secondary | ICD-10-CM

## 2012-12-11 DIAGNOSIS — I739 Peripheral vascular disease, unspecified: Secondary | ICD-10-CM

## 2012-12-11 DIAGNOSIS — E785 Hyperlipidemia, unspecified: Secondary | ICD-10-CM

## 2012-12-11 DIAGNOSIS — E039 Hypothyroidism, unspecified: Secondary | ICD-10-CM

## 2012-12-11 NOTE — Progress Notes (Signed)
Patient ID: Brandi Rice, female   DOB: 04/27/1921, 79 y.o.   MRN: [id number] This is a routine visit.  Level of care skilled.  Facility-[facility].   Chief complaint-medical management of anemia hyperlipidemia dementia diabetes neuropathy edema hypothyroidism peripheral vascular disease .  History of present illness  patient is a very pleasant elderly resident with the above diagnosis-she continues to be quite stable.  Has not really had any significant acute recent issues to my knowledge she denies any as well.  Her weight has some variability but does not appear to be grossly changed from her baseline she initially was 247 in December went down to2 34 in March now she is back up to 241.4 -she does eat well she does have a history of eating snacks   does have a history of lower extremity edema--this appears relatively baseline tonight.  She does have a history of pulmonary embolism  t she is on aspirin .  She's also with a history of peripheral vascular disease.and is on Pletal  She also has a listed history of hypertension  -she is on lisinopril--blood pressure 143/86-he does not appear to be consistently elevated although we will need to recheck this over the next few days .  Also a history of peripheral neuropathy that is stable on Neurontin.  She is a type II diabetic on Glucophage her blood sugars appear to be satisfactory in the low 100s-- hemoglobin A1c back in June was 6.6.  TSH back done in May was within normal limits at 1.99-she is on Synthroid with a listed history of hypothyroidism .  Family medical social history has been reviewed her discharge summary on 10/28/2008.   Medications have been reviewed per MAR .  Review of systems.  General she is denying any fever or chills her weight has been relatively stable.  Skin-denies issues.  Head ears eyes nose mouth and throat-does not complaining of sore throat nasal discharge or visual changes.  Respiratory-no complaints of shortness of  breath or cough.  Cardiac--no chest pain has some baseline lower extremity edema.  GI-denies any nausea vomiting diarrhea constipation or abdominal pain.  GU no complaints of dysuria.  Muscle skeletal-has complained of some leg pain in the past but does not complaining of that tonight the Neurontin appears to help .  Physical exam.  Temperature 98.1 pulse 68 respirations 20 blood pressure 143/86 her weight is stable at 241.4 .  In general this is a well-nourished elderly female in no distress sitting comfortably in her chair.  Her skin is warm and dry.  Eyes pupils equal round react to light sclera and conjunctiva are clear.  Chest is clear to auscultation without rhonchi rales or wheezes.  Heart regular rate and rhythm with a [id number] systolic murmur-she has baseline lower extremity edema obese changes +.  Abdomen is soft nontender with positive bowel sounds it is obese.  Muscle skeletal moves all extremities at baseline ambulates at times with a walker this appears relatively unchanged from previous exams-.  Neurologic-grossly intact speech is clear-- no lateralizing findings .  Labs.   08/31/2012.  Hemoglobin A1c 6.6.  WBC 4.4 hemoglobin 11.7 platelets 218.  Sodium 144 potassium 4.2 BUN 9 creatinine 0.93.  Bilirubin 0.2-albumin 3.2 otherwise liver function tests within normal limits.    08/01/2012.  [id number].  2- 24th 2014.  WBC 4.7 hemoglobin 10.9 platelets 233.  Sodium 141 potassium 4.2 BUN 9 creatinine 0.88  05/14/2012.  Cholesterol 150 triglycerides 109 HDL 37 LDL 91.  03/07/2012.  Hemoglobin A1c 7.1.   Assessment and plan.  #1-peripheral vascular disease-this appears stable she is on Pletal as well as aspirin.  #2-anemia-this is been stable .  #3 hypothyroidism this appears stable on Synthroid recent TSH within normal limits.  #4-dementia-appears stable this is quite mild she is on Namenda as well as Aricept-she continues to function quite well in this setting.   Numbers 5-history of pulmonary embolism-this is remote there is been no recurrence she is on anticoagulation aspirin and Pletal.  #6 status hypertension-usually is quite stable-most recent one is slightly elevated systolic-Will check blood pressures daily for 5 days with a log for review  #7-hyperlipidemia--this has been stable in the pastll update lipid panel-- she is on pravastatin-.  #8-diabetes type 2-this appears   Stable--on Glucophage.  #9-edema-appears stable she is on low dose Lasix with potassium Will update metabolic panel.  #10 -- history of neuropathy-is on Neurontin this appears to be stable.  #11-history of osteoporosis she is on calcium supplementation.  #12-history of GERD she is on PPI and this is stable as well  History of osteoarthritis-she is stable she does receive Tylenol for pain which appears to be effective .  [id number] note 30 minutes spent assessing patient and formulating plan of care for numerous diagnoses

## 2012-12-14 LAB — GLUCOSE, CAPILLARY: Glucose-Capillary: 119 mg/dL — ABNORMAL HIGH (ref 70–99)

## 2012-12-15 LAB — GLUCOSE, CAPILLARY
Glucose-Capillary: 217 mg/dL — ABNORMAL HIGH (ref 70–99)
Glucose-Capillary: 182 mg/dL — ABNORMAL HIGH (ref 70–99)

## 2012-12-17 LAB — GLUCOSE, CAPILLARY: Glucose-Capillary: 132 mg/dL — ABNORMAL HIGH (ref 70–99)

## 2012-12-24 LAB — GLUCOSE, CAPILLARY
Glucose-Capillary: 115 mg/dL — ABNORMAL HIGH (ref 70–99)
Glucose-Capillary: 91 mg/dL (ref 70–99)

## 2012-12-31 LAB — GLUCOSE, CAPILLARY
Glucose-Capillary: 129 mg/dL — ABNORMAL HIGH (ref 70–99)
Glucose-Capillary: 134 mg/dL — ABNORMAL HIGH (ref 70–99)

## 2013-01-04 LAB — GLUCOSE, CAPILLARY: Glucose-Capillary: 171 mg/dL — ABNORMAL HIGH (ref 70–99)

## 2013-01-09 LAB — GLUCOSE, CAPILLARY: Glucose-Capillary: 166 mg/dL — ABNORMAL HIGH (ref 70–99)

## 2013-01-14 LAB — GLUCOSE, CAPILLARY: Glucose-Capillary: 116 mg/dL — ABNORMAL HIGH (ref 70–99)

## 2013-01-18 LAB — GLUCOSE, CAPILLARY: Glucose-Capillary: 127 mg/dL — ABNORMAL HIGH (ref 70–99)

## 2013-01-19 LAB — GLUCOSE, CAPILLARY: Glucose-Capillary: 99 mg/dL (ref 70–99)

## 2013-01-21 LAB — GLUCOSE, CAPILLARY
Glucose-Capillary: 130 mg/dL — ABNORMAL HIGH (ref 70–99)
Glucose-Capillary: 193 mg/dL — ABNORMAL HIGH (ref 70–99)

## 2013-01-25 LAB — GLUCOSE, CAPILLARY: Glucose-Capillary: 133 mg/dL — ABNORMAL HIGH (ref 70–99)

## 2013-01-26 LAB — GLUCOSE, CAPILLARY: Glucose-Capillary: 130 mg/dL — ABNORMAL HIGH (ref 70–99)

## 2013-01-28 LAB — GLUCOSE, CAPILLARY
Glucose-Capillary: 129 mg/dL — ABNORMAL HIGH (ref 70–99)
Glucose-Capillary: 131 mg/dL — ABNORMAL HIGH (ref 70–99)

## 2013-02-01 LAB — GLUCOSE, CAPILLARY
Glucose-Capillary: 129 mg/dL — ABNORMAL HIGH (ref 70–99)
Glucose-Capillary: 184 mg/dL — ABNORMAL HIGH (ref 70–99)

## 2013-02-04 LAB — GLUCOSE, CAPILLARY: Glucose-Capillary: 150 mg/dL — ABNORMAL HIGH (ref 70–99)

## 2013-02-11 LAB — GLUCOSE, CAPILLARY
Glucose-Capillary: 132 mg/dL — ABNORMAL HIGH (ref 70–99)
Glucose-Capillary: 132 mg/dL — ABNORMAL HIGH (ref 70–99)

## 2013-02-15 LAB — GLUCOSE, CAPILLARY
Glucose-Capillary: 118 mg/dL — ABNORMAL HIGH (ref 70–99)
Glucose-Capillary: 116 mg/dL — ABNORMAL HIGH (ref 70–99)

## 2013-02-18 LAB — GLUCOSE, CAPILLARY: Glucose-Capillary: 114 mg/dL — ABNORMAL HIGH (ref 70–99)

## 2013-02-19 LAB — GLUCOSE, CAPILLARY: Glucose-Capillary: 245 mg/dL — ABNORMAL HIGH (ref 70–99)

## 2013-02-22 LAB — GLUCOSE, CAPILLARY
Glucose-Capillary: 124 mg/dL — ABNORMAL HIGH (ref 70–99)
Glucose-Capillary: 172 mg/dL — ABNORMAL HIGH (ref 70–99)

## 2013-02-25 LAB — GLUCOSE, CAPILLARY
Glucose-Capillary: 119 mg/dL — ABNORMAL HIGH (ref 70–99)
Glucose-Capillary: 123 mg/dL — ABNORMAL HIGH (ref 70–99)

## 2013-03-01 LAB — GLUCOSE, CAPILLARY
Glucose-Capillary: 120 mg/dL — ABNORMAL HIGH (ref 70–99)
Glucose-Capillary: 103 mg/dL — ABNORMAL HIGH (ref 70–99)

## 2013-03-02 LAB — GLUCOSE, CAPILLARY: Glucose-Capillary: 150 mg/dL — ABNORMAL HIGH (ref 70–99)

## 2013-03-08 LAB — GLUCOSE, CAPILLARY: Glucose-Capillary: 122 mg/dL — ABNORMAL HIGH (ref 70–99)

## 2013-03-11 LAB — GLUCOSE, CAPILLARY: Glucose-Capillary: 118 mg/dL — ABNORMAL HIGH (ref 70–99)

## 2013-03-12 LAB — GLUCOSE, CAPILLARY: Glucose-Capillary: 137 mg/dL — ABNORMAL HIGH (ref 70–99)

## 2013-03-15 LAB — GLUCOSE, CAPILLARY: Glucose-Capillary: 119 mg/dL — ABNORMAL HIGH (ref 70–99)

## 2013-03-18 LAB — GLUCOSE, CAPILLARY: Glucose-Capillary: 115 mg/dL — ABNORMAL HIGH (ref 70–99)

## 2013-03-19 LAB — GLUCOSE, CAPILLARY: Glucose-Capillary: 84 mg/dL (ref 70–99)

## 2013-03-21 ENCOUNTER — Non-Acute Institutional Stay (SKILLED_NURSING_FACILITY): Payer: Medicare Other | Admitting: Internal Medicine

## 2013-03-21 DIAGNOSIS — E119 Type 2 diabetes mellitus without complications: Secondary | ICD-10-CM

## 2013-03-21 DIAGNOSIS — I739 Peripheral vascular disease, unspecified: Secondary | ICD-10-CM

## 2013-03-21 DIAGNOSIS — M479 Spondylosis, unspecified: Secondary | ICD-10-CM

## 2013-03-21 DIAGNOSIS — I10 Essential (primary) hypertension: Secondary | ICD-10-CM

## 2013-03-21 DIAGNOSIS — R609 Edema, unspecified: Secondary | ICD-10-CM

## 2013-03-21 DIAGNOSIS — Z86718 Personal history of other venous thrombosis and embolism: Secondary | ICD-10-CM

## 2013-03-21 DIAGNOSIS — E039 Hypothyroidism, unspecified: Secondary | ICD-10-CM

## 2013-03-21 DIAGNOSIS — K219 Gastro-esophageal reflux disease without esophagitis: Secondary | ICD-10-CM

## 2013-03-21 DIAGNOSIS — E785 Hyperlipidemia, unspecified: Secondary | ICD-10-CM

## 2013-03-21 NOTE — Progress Notes (Signed)
Patient ID: Brandi Rice, female   DOB: 25-Aug-1921, 79 y.o.   MRN: [id number] This is a routine visit.  Level of care skilled.  Facility-[facility].   Chief complaint-medical management of anemia hyperlipidemia dementia diabetes neuropathy edema hypothyroidism peripheral vascular disease  .  History of present illness  patient is a very pleasant elderly resident with the above diagnosis-she continues to be quite stable.  Has not really had any significant acute recent issues to my knowledge she denies any as well.  Her weight has some variability but does not appear to be grossly changed from her baseline she initially was 247 in December went down to2 34 in March now she is back up to 243 -she does eat well she does have a history of eating snacks  does have a history of lower extremity edema--this appears relatively baseline tonight.  She does have a history of pulmonary embolism t she is on aspirin .  She's also with a history of peripheral vascular disease.and is on Pletal  She also has a listed history of hypertension -she is on lisinopril--blood pressure 138/84-124/79 s  .  Also a history of peripheral neuropathy that is stable on Neurontin.  She is a type II diabetic on Glucophage her blood sugars appear to be satisfactory in the low 100s-- hemoglobin A1c and in September was 7.1.  TSH back done in May was within normal limits at 1.99-she is on Synthroid with a listed history of hypothyroidism  .  Family medical social history has been reviewed per discharge summary on 10/28/2008 .  Medications have been reviewed per MAR  .  Review of systems.  General she is denying any fever or chills her weight has been relatively stable.  Skin-denies issues.  Head ears eyes nose mouth and throat-does not complaining of sore throat nasal discharge or visual changes.  Respiratory-no complaints of shortness of breath or cough.  Cardiac--no chest pain has some baseline lower extremity edema.  GI-denies  any nausea vomiting diarrhea constipation or abdominal pain.  GU no complaints of dysuria.  Muscle skeletal-has complained of some leg pain in the past but does not complaining of that tonight the Neurontin appears to help  .  Physical exam.  Temperature 98.6 pulse 87 respirations 20 blood pressure 138/84 her weight is stable at 243.2  .  In general this is a well-nourished elderly female in no distress lying comfortably in bed.  Her skin is warm and dry.  Eyes pupils equal round react to light sclera and conjunctiva are clear.  Chest is clear to auscultation without rhonchi rales or wheezes.  Heart regular rate and rhythm with a [id number] systolic murmur-she has baseline lower extremity edema obese changes +.--  Abdomen is soft nontender with positive bowel sounds it is obese.  Muscle skeletal moves all extremities at baseline ambulates at times with a walker this appears relatively unchanged from previous exams-.  Neurologic-grossly intact speech is clear-- no lateralizing findings  .  Labs  12/12/2012.  Cholesterol 155-triglycerides 104-LDL 98-HDL 36-.  Hemoglobin A1c--7.1.  CBC 4.6 hemoglobin 11.3 platelets 215.  Sodium 143 potassium 4 BUN 11 creatinine 0.98.  Marland Kitchen  08/31/2012.  Hemoglobin A1c 6.6.  WBC 4.4 hemoglobin 11.7 platelets 218.  Sodium 144 potassium 4.2 BUN 9 creatinine 0.93.  Bilirubin 0.2-albumin 3.2 otherwise liver function tests within normal limits .  08/01/2012.  [id number].   2- 24th 2014.  WBC 4.7 hemoglobin 10.9 platelets 233.  Sodium 141 potassium 4.2 BUN 9 creatinine 0.88  05/14/2012.  Cholesterol 150 triglycerides 109 HDL 37 LDL 91.  03/07/2012.  Hemoglobin A1c 7.1.   Assessment and plan.  #1-peripheral vascular disease-this appears stable she is on Pletal as well as aspirin.  #2-anemia-this is been stable --Will recheck CBC.  #3 hypothyroidism this appears stable on Synthroid --we'll update TSH.  #4-dementia-appears stable this is quite mild she is  on Namenda as well as Aricept-she continues to function quite well in this setting.  Numbers 5-history of pulmonary embolism-this is remote there is been no recurrence she is on anticoagulation aspirin and Pletal.  #6 status hypertension-usually is quite stablee is on low-dose lisinopril  #7-hyperlipidemia--this has been stable - she is on pravastatin-. We'll check liver function tests #8-diabetes type 2-this appears Stable--on Glucophage.  #9-edema-appears stable she is on low dose Lasix with potassium Will update metabolic panel.  #10 -- history of neuropathy-is on Neurontin this appears to be stable.  #11-history of osteoporosis she is on calcium supplementation.  #12-history of GERD she is on PPI and this is stable as well  History of osteoarthritis-she is stable she does receive Tylenol for pain which appears to be effective  .  [id number]

## 2013-03-25 LAB — GLUCOSE, CAPILLARY
Glucose-Capillary: 128 mg/dL — ABNORMAL HIGH (ref 70–99)
Glucose-Capillary: 131 mg/dL — ABNORMAL HIGH (ref 70–99)

## 2013-03-29 LAB — GLUCOSE, CAPILLARY: Glucose-Capillary: 130 mg/dL — ABNORMAL HIGH (ref 70–99)

## 2013-04-01 LAB — GLUCOSE, CAPILLARY
Glucose-Capillary: 153 mg/dL — ABNORMAL HIGH (ref 70–99)
Glucose-Capillary: 113 mg/dL — ABNORMAL HIGH (ref 70–99)

## 2013-04-05 LAB — GLUCOSE, CAPILLARY: Glucose-Capillary: 124 mg/dL — ABNORMAL HIGH (ref 70–99)

## 2013-04-08 LAB — GLUCOSE, CAPILLARY
Glucose-Capillary: 130 mg/dL — ABNORMAL HIGH (ref 70–99)
Glucose-Capillary: 154 mg/dL — ABNORMAL HIGH (ref 70–99)

## 2013-04-12 LAB — GLUCOSE, CAPILLARY
GLUCOSE-CAPILLARY: 103 mg/dL — AB (ref 70–99)
Glucose-Capillary: 147 mg/dL — ABNORMAL HIGH (ref 70–99)

## 2013-04-15 LAB — GLUCOSE, CAPILLARY: Glucose-Capillary: 117 mg/dL — ABNORMAL HIGH (ref 70–99)

## 2013-04-19 LAB — GLUCOSE, CAPILLARY: GLUCOSE-CAPILLARY: 118 mg/dL — AB (ref 70–99)

## 2013-04-22 LAB — GLUCOSE, CAPILLARY
GLUCOSE-CAPILLARY: 116 mg/dL — AB (ref 70–99)
Glucose-Capillary: 115 mg/dL — ABNORMAL HIGH (ref 70–99)

## 2013-04-26 LAB — GLUCOSE, CAPILLARY
GLUCOSE-CAPILLARY: 112 mg/dL — AB (ref 70–99)
Glucose-Capillary: 119 mg/dL — ABNORMAL HIGH (ref 70–99)

## 2013-04-29 LAB — GLUCOSE, CAPILLARY
Glucose-Capillary: 123 mg/dL — ABNORMAL HIGH (ref 70–99)
Glucose-Capillary: 103 mg/dL — ABNORMAL HIGH (ref 70–99)

## 2013-05-03 LAB — GLUCOSE, CAPILLARY: Glucose-Capillary: 149 mg/dL — ABNORMAL HIGH (ref 70–99)

## 2013-05-06 LAB — GLUCOSE, CAPILLARY
GLUCOSE-CAPILLARY: 121 mg/dL — AB (ref 70–99)
Glucose-Capillary: 120 mg/dL — ABNORMAL HIGH (ref 70–99)

## 2013-05-10 LAB — GLUCOSE, CAPILLARY: GLUCOSE-CAPILLARY: 206 mg/dL — AB (ref 70–99)

## 2013-05-13 LAB — GLUCOSE, CAPILLARY
GLUCOSE-CAPILLARY: 168 mg/dL — AB (ref 70–99)
GLUCOSE-CAPILLARY: 151 mg/dL — AB (ref 70–99)
Glucose-Capillary: 113 mg/dL — ABNORMAL HIGH (ref 70–99)

## 2013-05-17 LAB — GLUCOSE, CAPILLARY: Glucose-Capillary: 111 mg/dL — ABNORMAL HIGH (ref 70–99)

## 2013-05-20 LAB — GLUCOSE, CAPILLARY
Glucose-Capillary: 116 mg/dL — ABNORMAL HIGH (ref 70–99)
Glucose-Capillary: 155 mg/dL — ABNORMAL HIGH (ref 70–99)

## 2013-05-24 LAB — GLUCOSE, CAPILLARY
Glucose-Capillary: 140 mg/dL — ABNORMAL HIGH (ref 70–99)
Glucose-Capillary: 125 mg/dL — ABNORMAL HIGH (ref 70–99)

## 2013-05-26 LAB — GLUCOSE, CAPILLARY: Glucose-Capillary: 256 mg/dL — ABNORMAL HIGH (ref 70–99)

## 2013-05-27 LAB — GLUCOSE, CAPILLARY
GLUCOSE-CAPILLARY: 177 mg/dL — AB (ref 70–99)
Glucose-Capillary: 100 mg/dL — ABNORMAL HIGH (ref 70–99)

## 2013-05-29 LAB — GLUCOSE, CAPILLARY: GLUCOSE-CAPILLARY: 107 mg/dL — AB (ref 70–99)

## 2013-05-31 LAB — GLUCOSE, CAPILLARY: Glucose-Capillary: 126 mg/dL — ABNORMAL HIGH (ref 70–99)

## 2013-06-03 LAB — GLUCOSE, CAPILLARY
Glucose-Capillary: 132 mg/dL — ABNORMAL HIGH (ref 70–99)
Glucose-Capillary: 122 mg/dL — ABNORMAL HIGH (ref 70–99)

## 2013-06-07 LAB — GLUCOSE, CAPILLARY
Glucose-Capillary: 124 mg/dL — ABNORMAL HIGH (ref 70–99)
Glucose-Capillary: 144 mg/dL — ABNORMAL HIGH (ref 70–99)

## 2013-06-10 LAB — GLUCOSE, CAPILLARY
GLUCOSE-CAPILLARY: 126 mg/dL — AB (ref 70–99)
Glucose-Capillary: 129 mg/dL — ABNORMAL HIGH (ref 70–99)

## 2013-06-11 ENCOUNTER — Encounter: Payer: Self-pay | Admitting: Internal Medicine

## 2013-06-11 ENCOUNTER — Non-Acute Institutional Stay (SKILLED_NURSING_FACILITY): Payer: Medicare Other | Admitting: Internal Medicine

## 2013-06-11 DIAGNOSIS — E039 Hypothyroidism, unspecified: Secondary | ICD-10-CM

## 2013-06-11 DIAGNOSIS — R609 Edema, unspecified: Secondary | ICD-10-CM

## 2013-06-11 DIAGNOSIS — M479 Spondylosis, unspecified: Secondary | ICD-10-CM

## 2013-06-11 DIAGNOSIS — I739 Peripheral vascular disease, unspecified: Secondary | ICD-10-CM

## 2013-06-11 DIAGNOSIS — E119 Type 2 diabetes mellitus without complications: Secondary | ICD-10-CM

## 2013-06-11 DIAGNOSIS — F068 Other specified mental disorders due to known physiological condition: Secondary | ICD-10-CM

## 2013-06-11 DIAGNOSIS — I10 Essential (primary) hypertension: Secondary | ICD-10-CM

## 2013-06-11 DIAGNOSIS — Z86718 Personal history of other venous thrombosis and embolism: Secondary | ICD-10-CM

## 2013-06-11 DIAGNOSIS — E785 Hyperlipidemia, unspecified: Secondary | ICD-10-CM

## 2013-06-11 NOTE — Progress Notes (Signed)
Patient ID: Brandi Rice, female   DOB: 1921-10-23, 80 y.o.   MRN: [id number]       This is a routine visit.  Level of care skilled.  Facility [facility].     Chief complaint-medical management of anemia hyperlipidemia dementia diabetes neuropathy edema hypothyroidism peripheral vascular disease   .   History of present illness   patient is a very pleasant elderly resident with the above diagnosis-she continues to be quite stable.   Has not really had any significant acute recent issues to my knowledge she denies any as well.   Her weight has some variability but does not appear to be grossly changed from her baseline she initially was 247 in December went down to2 34 in March-- most recent weight is 239.8 -she does eat well she does have a history of eating snacks   does have a history of lower extremity edema--this appears relatively baseline tonight.   She does have a history of pulmonary embolism t she is on aspirin .   She's also with a history of peripheral vascular disease.and is on Pletal   She also has a listed history of hypertension -she is on lisinopril--blood pressure 132/72-127/74 s   .   Also a history of peripheral neuropathy that is stable on Neurontin.   She is a type II diabetic on Glucophage her blood sugars appear to be satisfactory in the low 100s-- hemoglobin A1c and in September was 7.1.   TSH back done in December was within normal limits at2.245-she is on Synthroid with a listed history of hypothyroidism   .   Family medical social history has been reviewed per discharge summary on 10/28/2008 .   Medications have been reviewed per MAR   .   Review of systems.   General she is denying any fever or chills her weight has been relatively stable.   Skin-denies issues.   Head ears eyes nose mouth and throat-does not complaining of sore throat nasal discharge or visual changes.   Respiratory-no complaints of shortness of breath or cough.   Cardiac--no chest pain  has some baseline lower extremity edema.   GI-denies any nausea vomiting diarrhea constipation or abdominal pain.   GU no complaints of dysuria.   Muscle skeletal-has complained of some leg pain in the past but does not complaining of that today the Neurontin appears to help   .   Physical exam.   Temperature 97.9 pulse 75 respirations 18 blood pressure 132/72 weight is relatively stable at 239.8   .   In general this is a well-nourished elderly female in no distress .   Her skin is warm and dry.   Eyes pupils equal round react to light sclera and conjunctiva are clear.   Chest is clear to auscultation without rhonchi rales or wheezes.   Heart regular rate and rhythm with a A999333 systolic murmur-she has baseline lower extremity edema obese changes +.--   Abdomen is soft nontender with positive bowel sounds it is obese.   Muscle skeletal moves all extremities at baseline ambulates at times with a walker this appears relatively unchanged from previous exams-. appears to ambulate fairly well with her walker today    Neurologic-grossly intact speech is clear-- no lateralizing findings   .   Labs  03/25/2013.  [id number].  WBC 5.2 hemoglobin 11.3 platelets 225.  Sodium 142 potassium 3.9 BUN 8 creatinine 0.88.  Liver function tests within normal limits except albumin of 3.8.    12/12/2012.  Cholesterol 155-triglycerides 104-LDL 98-HDL 36-.   Hemoglobin A1c--7.1.   CBC 4.6 hemoglobin 11.3 platelets 215.   Sodium 143 potassium 4 BUN 11 creatinine 0.98.   Marland Kitchen   08/31/2012.   Hemoglobin A1c 6.6.   WBC 4.4 hemoglobin 11.7 platelets 218.   Sodium 144 potassium 4.2 BUN 9 creatinine 0.93.   Bilirubin 0.2-albumin 3.2 otherwise liver function tests within normal limits .   08/01/2012.   [id number].    2- 24th 2014.   WBC 4.7 hemoglobin 10.9 platelets 233.   Sodium 141 potassium 4.2 BUN 9 creatinine 0.88   05/14/2012.   Cholesterol 150 triglycerides 109 HDL 37 LDL 91.     03/07/2012.   Hemoglobin A1c 7.1.    Assessment and plan.   #1-peripheral vascular disease-this appears stable she is on Pletal as well as aspirin.   #2-anemia-this is been stable --Will recheck CBC.   #3 hypothyroidism this appears stable on Synthroid --.   #4-dementia-appears stable this is quite mild she is on Namenda as well as Aricept-she continues to function quite well in this setting.   Numbers 5-history of pulmonary embolism-this is remote there is been no recurrence she is on anticoagulation aspirin and Pletal.   #6 status hypertension-usually is quite stablee is on low-dose lisinopril   #7-hyperlipidemia--this has been stable - she is on pravastatin-. recent liver function tests were fairly unremarkable #8-diabetes type 2-this appears Stable--on Glucophage.--Cbgs run largely in lower   100's --We'll update hemoglobin A1c    #9-edema-appears stable she is on low dose Lasix with potassium Will update metabolic panel.   #10 -- history of neuropathy-is on Neurontin this appears to be stable.   #11-history of osteoporosis she is on calcium supplementation.   #12-history of GERD she is on PPI and this is stable as well   History of osteoarthritis-she is stable she does receive Tylenol for pain which appears to be effective   .   [id number]

## 2013-06-14 LAB — GLUCOSE, CAPILLARY: Glucose-Capillary: 121 mg/dL — ABNORMAL HIGH (ref 70–99)

## 2013-06-17 LAB — GLUCOSE, CAPILLARY
GLUCOSE-CAPILLARY: 126 mg/dL — AB (ref 70–99)
Glucose-Capillary: 140 mg/dL — ABNORMAL HIGH (ref 70–99)

## 2013-06-21 LAB — GLUCOSE, CAPILLARY
GLUCOSE-CAPILLARY: 149 mg/dL — AB (ref 70–99)
GLUCOSE-CAPILLARY: 176 mg/dL — AB (ref 70–99)

## 2013-06-24 LAB — GLUCOSE, CAPILLARY
GLUCOSE-CAPILLARY: 120 mg/dL — AB (ref 70–99)
Glucose-Capillary: 125 mg/dL — ABNORMAL HIGH (ref 70–99)

## 2013-06-28 LAB — GLUCOSE, CAPILLARY
GLUCOSE-CAPILLARY: 167 mg/dL — AB (ref 70–99)
Glucose-Capillary: 139 mg/dL — ABNORMAL HIGH (ref 70–99)

## 2013-07-01 LAB — GLUCOSE, CAPILLARY
Glucose-Capillary: 128 mg/dL — ABNORMAL HIGH (ref 70–99)
Glucose-Capillary: 143 mg/dL — ABNORMAL HIGH (ref 70–99)

## 2013-07-05 LAB — GLUCOSE, CAPILLARY
GLUCOSE-CAPILLARY: 138 mg/dL — AB (ref 70–99)
Glucose-Capillary: 172 mg/dL — ABNORMAL HIGH (ref 70–99)

## 2013-07-08 LAB — GLUCOSE, CAPILLARY
GLUCOSE-CAPILLARY: 145 mg/dL — AB (ref 70–99)
GLUCOSE-CAPILLARY: 167 mg/dL — AB (ref 70–99)

## 2013-07-12 LAB — GLUCOSE, CAPILLARY
Glucose-Capillary: 115 mg/dL — ABNORMAL HIGH (ref 70–99)
Glucose-Capillary: 164 mg/dL — ABNORMAL HIGH (ref 70–99)

## 2013-07-15 LAB — GLUCOSE, CAPILLARY
Glucose-Capillary: 127 mg/dL — ABNORMAL HIGH (ref 70–99)
Glucose-Capillary: 174 mg/dL — ABNORMAL HIGH (ref 70–99)

## 2013-07-17 LAB — GLUCOSE, CAPILLARY: GLUCOSE-CAPILLARY: 98 mg/dL (ref 70–99)

## 2013-07-19 LAB — GLUCOSE, CAPILLARY
Glucose-Capillary: 131 mg/dL — ABNORMAL HIGH (ref 70–99)
Glucose-Capillary: 131 mg/dL — ABNORMAL HIGH (ref 70–99)

## 2013-07-22 LAB — GLUCOSE, CAPILLARY: GLUCOSE-CAPILLARY: 134 mg/dL — AB (ref 70–99)

## 2013-07-26 LAB — GLUCOSE, CAPILLARY: Glucose-Capillary: 138 mg/dL — ABNORMAL HIGH (ref 70–99)

## 2013-07-28 LAB — GLUCOSE, CAPILLARY: Glucose-Capillary: 115 mg/dL — ABNORMAL HIGH (ref 70–99)

## 2013-07-29 LAB — GLUCOSE, CAPILLARY
Glucose-Capillary: 121 mg/dL — ABNORMAL HIGH (ref 70–99)
Glucose-Capillary: 146 mg/dL — ABNORMAL HIGH (ref 70–99)

## 2013-08-02 LAB — GLUCOSE, CAPILLARY
GLUCOSE-CAPILLARY: 132 mg/dL — AB (ref 70–99)
Glucose-Capillary: 160 mg/dL — ABNORMAL HIGH (ref 70–99)

## 2013-08-05 LAB — GLUCOSE, CAPILLARY
Glucose-Capillary: 129 mg/dL — ABNORMAL HIGH (ref 70–99)
Glucose-Capillary: 125 mg/dL — ABNORMAL HIGH (ref 70–99)

## 2013-08-07 LAB — GLUCOSE, CAPILLARY: Glucose-Capillary: 115 mg/dL — ABNORMAL HIGH (ref 70–99)

## 2013-08-09 LAB — GLUCOSE, CAPILLARY: Glucose-Capillary: 148 mg/dL — ABNORMAL HIGH (ref 70–99)

## 2013-08-12 LAB — GLUCOSE, CAPILLARY
GLUCOSE-CAPILLARY: 128 mg/dL — AB (ref 70–99)
Glucose-Capillary: 132 mg/dL — ABNORMAL HIGH (ref 70–99)

## 2013-08-16 LAB — GLUCOSE, CAPILLARY
Glucose-Capillary: 133 mg/dL — ABNORMAL HIGH (ref 70–99)
Glucose-Capillary: 183 mg/dL — ABNORMAL HIGH (ref 70–99)

## 2013-08-19 LAB — GLUCOSE, CAPILLARY
GLUCOSE-CAPILLARY: 129 mg/dL — AB (ref 70–99)
GLUCOSE-CAPILLARY: 154 mg/dL — AB (ref 70–99)
Glucose-Capillary: 141 mg/dL — ABNORMAL HIGH (ref 70–99)

## 2013-08-23 LAB — GLUCOSE, CAPILLARY: GLUCOSE-CAPILLARY: 150 mg/dL — AB (ref 70–99)

## 2013-08-26 LAB — GLUCOSE, CAPILLARY
GLUCOSE-CAPILLARY: 134 mg/dL — AB (ref 70–99)
Glucose-Capillary: 123 mg/dL — ABNORMAL HIGH (ref 70–99)

## 2013-08-30 LAB — GLUCOSE, CAPILLARY
GLUCOSE-CAPILLARY: 149 mg/dL — AB (ref 70–99)
Glucose-Capillary: 139 mg/dL — ABNORMAL HIGH (ref 70–99)

## 2013-09-02 LAB — GLUCOSE, CAPILLARY
GLUCOSE-CAPILLARY: 155 mg/dL — AB (ref 70–99)
Glucose-Capillary: 100 mg/dL — ABNORMAL HIGH (ref 70–99)
Glucose-Capillary: 176 mg/dL — ABNORMAL HIGH (ref 70–99)

## 2013-09-06 LAB — GLUCOSE, CAPILLARY: Glucose-Capillary: 149 mg/dL — ABNORMAL HIGH (ref 70–99)

## 2013-09-09 LAB — GLUCOSE, CAPILLARY
GLUCOSE-CAPILLARY: 147 mg/dL — AB (ref 70–99)
GLUCOSE-CAPILLARY: 250 mg/dL — AB (ref 70–99)

## 2013-09-13 LAB — GLUCOSE, CAPILLARY
GLUCOSE-CAPILLARY: 161 mg/dL — AB (ref 70–99)
Glucose-Capillary: 198 mg/dL — ABNORMAL HIGH (ref 70–99)

## 2013-09-16 LAB — GLUCOSE, CAPILLARY
Glucose-Capillary: 139 mg/dL — ABNORMAL HIGH (ref 70–99)
Glucose-Capillary: 135 mg/dL — ABNORMAL HIGH (ref 70–99)

## 2013-09-20 LAB — GLUCOSE, CAPILLARY
GLUCOSE-CAPILLARY: 146 mg/dL — AB (ref 70–99)
Glucose-Capillary: 206 mg/dL — ABNORMAL HIGH (ref 70–99)

## 2013-09-23 LAB — GLUCOSE, CAPILLARY
GLUCOSE-CAPILLARY: 138 mg/dL — AB (ref 70–99)
GLUCOSE-CAPILLARY: 148 mg/dL — AB (ref 70–99)

## 2013-09-27 LAB — GLUCOSE, CAPILLARY
Glucose-Capillary: 143 mg/dL — ABNORMAL HIGH (ref 70–99)
Glucose-Capillary: 196 mg/dL — ABNORMAL HIGH (ref 70–99)

## 2013-09-30 LAB — GLUCOSE, CAPILLARY
Glucose-Capillary: 128 mg/dL — ABNORMAL HIGH (ref 70–99)
Glucose-Capillary: 133 mg/dL — ABNORMAL HIGH (ref 70–99)

## 2013-10-04 LAB — GLUCOSE, CAPILLARY
GLUCOSE-CAPILLARY: 178 mg/dL — AB (ref 70–99)
Glucose-Capillary: 149 mg/dL — ABNORMAL HIGH (ref 70–99)

## 2013-10-05 ENCOUNTER — Non-Acute Institutional Stay (SKILLED_NURSING_FACILITY): Payer: Medicare Other | Admitting: Internal Medicine

## 2013-10-05 ENCOUNTER — Encounter: Payer: Self-pay | Admitting: Internal Medicine

## 2013-10-05 DIAGNOSIS — Z86718 Personal history of other venous thrombosis and embolism: Secondary | ICD-10-CM

## 2013-10-05 DIAGNOSIS — R609 Edema, unspecified: Secondary | ICD-10-CM

## 2013-10-05 DIAGNOSIS — K219 Gastro-esophageal reflux disease without esophagitis: Secondary | ICD-10-CM

## 2013-10-05 DIAGNOSIS — F068 Other specified mental disorders due to known physiological condition: Secondary | ICD-10-CM

## 2013-10-05 DIAGNOSIS — E785 Hyperlipidemia, unspecified: Secondary | ICD-10-CM

## 2013-10-05 DIAGNOSIS — I10 Essential (primary) hypertension: Secondary | ICD-10-CM

## 2013-10-05 DIAGNOSIS — E039 Hypothyroidism, unspecified: Secondary | ICD-10-CM

## 2013-10-05 DIAGNOSIS — E119 Type 2 diabetes mellitus without complications: Secondary | ICD-10-CM

## 2013-10-05 NOTE — Progress Notes (Signed)
Patient ID: Adella Nissen, female   DOB: 17-Jun-1921, 80 y.o.   MRN: [id number]   This is a routine visit.  Level of care skilled.  Facility [facility].   Chief complaint-medical management of anemia hyperlipidemia dementia diabetes neuropathy edema hypothyroidism peripheral vascular disease  .  History of present illness  patient is a very pleasant elderly resident with the above diagnosis-she continues to be quite stable.  Has not really had any significant acute recent issues to my knowledge she denies any as well.  Her weight has some variability but does not appear to be grossly changed from her baseline she initially was 247 in December went down to2 34 in March-- most recent weight is 244 -she does eat well she does have a history of eating snacks  does have a history of lower extremity edema--this appears relatively baseline tonight.  She does have a history of pulmonary embolism t she is on aspirin .  She's also with a history of peripheral vascular disease.and is on Pletal  She also has a listed history of hypertension -she is on lisinopril--blood pressure  127/78-131/84-is appears to be stable  s  .  Also a history of peripheral neuropathy that is stable on Neurontin.  She is a type II diabetic on Glucophage her blood sugars appear to be satisfactory in the low 100s-- hemoglobin A1c back in March was 6.8.  TSH back done in December was within normal limits at2.245-she is on Synthroid with a listed history of hypothyroidism  .  Family medical social history has been reviewed per discharge summary on 10/28/2008  .  Medications have been reviewed per MAR  .  Review of systems.  General she is denying any fever or chills her weight has been relatively stable to slowly gaining I suspect this is appetite related.  Skin-denies issues.  Head ears eyes nose mouth and throat-does not complaining of sore throat nasal discharge or visual changes.  Respiratory-no complaints of shortness of breath  or cough.  Cardiac--no chest pain has some baseline lower extremity edema.  GI-denies any nausea vomiting diarrhea constipation or abdominal pain.  GU no complaints of dysuria.  Muscle skeletal-has complained of some leg pain in the past but does not complaining of that today the Neurontin appears to help  .  Physical exam.  Temperature 97.2 pulse 76 respirations 20 blood pressure 127/78 O2 saturation 98% on room air weight is 244  .  In general this is a well-nourished elderly female in no distress sitting in her wheelchair .  Her skin is warm and dry.  Eyes pupils equal round react to light sclera and conjunctiva are clear.  Chest is clear to auscultation without rhonchi rales or wheezes.  Heart regular rate and rhythm with a A999333 systolic murmur-she has baseline lower extremity edema obese changes +.--  Abdomen is soft nontender with positive bowel sounds it is obese.  Muscle skeletal moves all extremities at baseline ambulates at times with a walker this appears relatively unchanged from previous exams-. appears to ambulate fairly well when she uses her walker otherwise ambulates in a wheelchair  Neurologic-grossly intact speech is clear-- no lateralizing findings  .  Labs  07/22/2013.  Cholesterol 152 triglycerides 109 HDL 33 LDL 97.  06/12/2013.  Hemoglobin A1c 6.8.  Sodium 143 potassium 4.3 BUN 8 creatinine 0.82.  WBC 4.4 hemoglobin 10.9 platelets 228  03/25/2013.   [id number].  WBC 5.2 hemoglobin 11.3 platelets 225.  Sodium 142 potassium 3.9 BUN 8 creatinine  0.88.  Liver function tests within normal limits except albumin of 3.8.  12/12/2012.  Cholesterol 155-triglycerides 104-LDL 98-HDL 36-.  Hemoglobin A1c--7.1.  CBC 4.6 hemoglobin 11.3 platelets 215.  Sodium 143 potassium 4 BUN 11 creatinine 0.98.  Marland Kitchen  08/31/2012.  Hemoglobin A1c 6.6.  WBC 4.4 hemoglobin 11.7 platelets 218.  Sodium 144 potassium 4.2 BUN 9 creatinine 0.93.  Bilirubin 0.2-albumin 3.2 otherwise  liver function tests within normal limits  .  08/01/2012.  [id number].  2- 24th 2014.  WBC 4.7 hemoglobin 10.9 platelets 233.  Sodium 141 potassium 4.2 BUN 9 creatinine 0.88  05/14/2012.  Cholesterol 150 triglycerides 109 HDL 37 LDL 91.  03/07/2012.  Hemoglobin A1c 7.1.   Assessment and plan.  #1-peripheral vascular disease-this appears stable she is on Pletal as well as aspirin.  #2-anemia-this is been stable --Will recheck CBC.  #3 hypothyroidism this appears stable on Synthroid she will need an updated TSH --.  #4-dementia-appears stable this is quite mild she is on Namenda as well as Aricept-she continues to function quite well in this setting.  Numbers 5-history of pulmonary embolism-this is remote there is been no recurrence she is on anticoagulation aspirin and Pletal.  #6 status hypertension-u is quite stable is on low-dose lisinopril  #7-hyperlipidemia--this has been stable - she is on pravastatin-recent cholesterol panel was fairly satisfactory. We'll update liver function tests  #8-diabetes type 2-this appears Stable--on Glucophage.--Cbgs run largely in lower--mid  100's --We'll update hemoglobin A1c  #9-edema-appears stable she is on low dose Lasix with potassium Will update metabolic panel.  #10 -- history of neuropathy-is on Neurontin this appears to be stable.  #11-history of osteoporosis she is on calcium supplementation.  #12-history of GERD she is on PPI and this is stable as well  History of osteoarthritis-she is stable she does receive Tylenol for pain which appears to be effective  .  [id number]

## 2013-10-07 LAB — GLUCOSE, CAPILLARY
Glucose-Capillary: 126 mg/dL — ABNORMAL HIGH (ref 70–99)
Glucose-Capillary: 134 mg/dL — ABNORMAL HIGH (ref 70–99)

## 2013-10-11 LAB — GLUCOSE, CAPILLARY
Glucose-Capillary: 134 mg/dL — ABNORMAL HIGH (ref 70–99)
Glucose-Capillary: 172 mg/dL — ABNORMAL HIGH (ref 70–99)

## 2013-10-14 LAB — GLUCOSE, CAPILLARY
GLUCOSE-CAPILLARY: 142 mg/dL — AB (ref 70–99)
Glucose-Capillary: 202 mg/dL — ABNORMAL HIGH (ref 70–99)

## 2013-10-18 LAB — GLUCOSE, CAPILLARY: Glucose-Capillary: 147 mg/dL — ABNORMAL HIGH (ref 70–99)

## 2013-10-21 LAB — GLUCOSE, CAPILLARY
Glucose-Capillary: 135 mg/dL — ABNORMAL HIGH (ref 70–99)
Glucose-Capillary: 194 mg/dL — ABNORMAL HIGH (ref 70–99)

## 2013-10-25 LAB — GLUCOSE, CAPILLARY
GLUCOSE-CAPILLARY: 153 mg/dL — AB (ref 70–99)
GLUCOSE-CAPILLARY: 154 mg/dL — AB (ref 70–99)

## 2013-10-28 LAB — GLUCOSE, CAPILLARY
Glucose-Capillary: 118 mg/dL — ABNORMAL HIGH (ref 70–99)
Glucose-Capillary: 141 mg/dL — ABNORMAL HIGH (ref 70–99)

## 2013-11-01 LAB — GLUCOSE, CAPILLARY: GLUCOSE-CAPILLARY: 137 mg/dL — AB (ref 70–99)

## 2013-11-04 LAB — GLUCOSE, CAPILLARY
GLUCOSE-CAPILLARY: 241 mg/dL — AB (ref 70–99)
Glucose-Capillary: 130 mg/dL — ABNORMAL HIGH (ref 70–99)

## 2013-11-08 LAB — GLUCOSE, CAPILLARY
Glucose-Capillary: 136 mg/dL — ABNORMAL HIGH (ref 70–99)
Glucose-Capillary: 183 mg/dL — ABNORMAL HIGH (ref 70–99)

## 2013-11-15 LAB — GLUCOSE, CAPILLARY
GLUCOSE-CAPILLARY: 141 mg/dL — AB (ref 70–99)
Glucose-Capillary: 151 mg/dL — ABNORMAL HIGH (ref 70–99)

## 2013-11-18 LAB — GLUCOSE, CAPILLARY
Glucose-Capillary: 166 mg/dL — ABNORMAL HIGH (ref 70–99)
Glucose-Capillary: 117 mg/dL — ABNORMAL HIGH (ref 70–99)

## 2013-11-22 LAB — GLUCOSE, CAPILLARY
Glucose-Capillary: 148 mg/dL — ABNORMAL HIGH (ref 70–99)
Glucose-Capillary: 116 mg/dL — ABNORMAL HIGH (ref 70–99)

## 2013-11-25 LAB — GLUCOSE, CAPILLARY
GLUCOSE-CAPILLARY: 161 mg/dL — AB (ref 70–99)
Glucose-Capillary: 141 mg/dL — ABNORMAL HIGH (ref 70–99)

## 2013-11-29 LAB — GLUCOSE, CAPILLARY: GLUCOSE-CAPILLARY: 152 mg/dL — AB (ref 70–99)

## 2013-12-02 LAB — GLUCOSE, CAPILLARY
Glucose-Capillary: 133 mg/dL — ABNORMAL HIGH (ref 70–99)
Glucose-Capillary: 170 mg/dL — ABNORMAL HIGH (ref 70–99)

## 2013-12-06 LAB — GLUCOSE, CAPILLARY
GLUCOSE-CAPILLARY: 146 mg/dL — AB (ref 70–99)
Glucose-Capillary: 143 mg/dL — ABNORMAL HIGH (ref 70–99)

## 2013-12-09 LAB — GLUCOSE, CAPILLARY
GLUCOSE-CAPILLARY: 179 mg/dL — AB (ref 70–99)
Glucose-Capillary: 114 mg/dL — ABNORMAL HIGH (ref 70–99)

## 2013-12-10 LAB — GLUCOSE, CAPILLARY: GLUCOSE-CAPILLARY: 128 mg/dL — AB (ref 70–99)

## 2013-12-13 LAB — GLUCOSE, CAPILLARY: Glucose-Capillary: 130 mg/dL — ABNORMAL HIGH (ref 70–99)

## 2013-12-16 LAB — GLUCOSE, CAPILLARY
GLUCOSE-CAPILLARY: 126 mg/dL — AB (ref 70–99)
GLUCOSE-CAPILLARY: 138 mg/dL — AB (ref 70–99)

## 2013-12-20 LAB — GLUCOSE, CAPILLARY
GLUCOSE-CAPILLARY: 133 mg/dL — AB (ref 70–99)
Glucose-Capillary: 124 mg/dL — ABNORMAL HIGH (ref 70–99)

## 2013-12-23 LAB — GLUCOSE, CAPILLARY
GLUCOSE-CAPILLARY: 138 mg/dL — AB (ref 70–99)
Glucose-Capillary: 142 mg/dL — ABNORMAL HIGH (ref 70–99)

## 2013-12-27 LAB — GLUCOSE, CAPILLARY: Glucose-Capillary: 121 mg/dL — ABNORMAL HIGH (ref 70–99)

## 2013-12-30 ENCOUNTER — Encounter: Payer: Self-pay | Admitting: *Deleted

## 2013-12-30 LAB — GLUCOSE, CAPILLARY
GLUCOSE-CAPILLARY: 128 mg/dL — AB (ref 70–99)
Glucose-Capillary: 138 mg/dL — ABNORMAL HIGH (ref 70–99)

## 2014-01-03 LAB — GLUCOSE, CAPILLARY
Glucose-Capillary: 129 mg/dL — ABNORMAL HIGH (ref 70–99)
Glucose-Capillary: 106 mg/dL — ABNORMAL HIGH (ref 70–99)

## 2014-01-06 LAB — GLUCOSE, CAPILLARY
Glucose-Capillary: 136 mg/dL — ABNORMAL HIGH (ref 70–99)
Glucose-Capillary: 136 mg/dL — ABNORMAL HIGH (ref 70–99)

## 2014-01-10 LAB — GLUCOSE, CAPILLARY: Glucose-Capillary: 125 mg/dL — ABNORMAL HIGH (ref 70–99)

## 2014-01-13 LAB — GLUCOSE, CAPILLARY
GLUCOSE-CAPILLARY: 153 mg/dL — AB (ref 70–99)
GLUCOSE-CAPILLARY: 166 mg/dL — AB (ref 70–99)

## 2014-01-17 LAB — GLUCOSE, CAPILLARY
Glucose-Capillary: 137 mg/dL — ABNORMAL HIGH (ref 70–99)
Glucose-Capillary: 209 mg/dL — ABNORMAL HIGH (ref 70–99)

## 2014-01-20 LAB — GLUCOSE, CAPILLARY
GLUCOSE-CAPILLARY: 137 mg/dL — AB (ref 70–99)
Glucose-Capillary: 165 mg/dL — ABNORMAL HIGH (ref 70–99)

## 2014-01-24 LAB — GLUCOSE, CAPILLARY: Glucose-Capillary: 134 mg/dL — ABNORMAL HIGH (ref 70–99)

## 2014-01-27 LAB — GLUCOSE, CAPILLARY
GLUCOSE-CAPILLARY: 163 mg/dL — AB (ref 70–99)
Glucose-Capillary: 124 mg/dL — ABNORMAL HIGH (ref 70–99)

## 2014-01-29 ENCOUNTER — Non-Acute Institutional Stay (SKILLED_NURSING_FACILITY): Payer: Medicare Other | Admitting: Internal Medicine

## 2014-01-29 ENCOUNTER — Encounter: Payer: Self-pay | Admitting: Internal Medicine

## 2014-01-29 DIAGNOSIS — I739 Peripheral vascular disease, unspecified: Secondary | ICD-10-CM

## 2014-01-29 DIAGNOSIS — E038 Other specified hypothyroidism: Secondary | ICD-10-CM

## 2014-01-29 DIAGNOSIS — F0390 Unspecified dementia without behavioral disturbance: Secondary | ICD-10-CM

## 2014-01-29 DIAGNOSIS — M47814 Spondylosis without myelopathy or radiculopathy, thoracic region: Secondary | ICD-10-CM

## 2014-01-29 DIAGNOSIS — E1151 Type 2 diabetes mellitus with diabetic peripheral angiopathy without gangrene: Secondary | ICD-10-CM | POA: Insufficient documentation

## 2014-01-29 DIAGNOSIS — I10 Essential (primary) hypertension: Secondary | ICD-10-CM

## 2014-01-29 DIAGNOSIS — E119 Type 2 diabetes mellitus without complications: Secondary | ICD-10-CM

## 2014-01-29 NOTE — Progress Notes (Signed)
Patient ID: Brandi Rice, female   DOB: 04-21-1921, 80 y.o.   MRN: [id number]   This is a routine visit.  Level of care skilled.  Facility [facility].   Chief complaint-medical management of anemia hyperlipidemia dementia diabetes neuropathy edema hypothyroidism peripheral vascular disease  .  History of present illness  patient is a  pleasant elderly resident with the above diagnosis-she continues to be quite stable.  Has not really had any significant acute recent issues to my knowledge she denies any as well.  Her weight has some variability but does not appear to be grossly changed from her baseline she initially was 247 in December went down to2 34 in March-- most recent weight is 240this mouth -she does eat well she does have a history of eating snacks  does have a history of lower extremity edema--this appears relatively baseline tonight.  She does have a history of pulmonary embolism t she is on aspirin .  She's also with a history of peripheral vascular disease.and is on Pletal  She also has a listed history of hypertension -she is on lisinopril--blood pressure 128/81-is appears to be stable  s  .  Also a history of peripheral neuropathy that is stable on Neurontin.  She is a type II diabetic on Glucophage her blood sugars appear to be satisfactory in the low 100s-- hemoglobin A1c was 7.3 in July we will update this.  TSH back done in July was within normal limits at 1.566-she is on Synthroid with a listed history of hypothyroidism  .  Family medical social history has been reviewed per discharge summary on 10/28/2008  .  Medications have been reviewed per MAR  .  Review of systems.  General she is denying any fever or chills her weight has been relatively stable .  Skin-denies issues.  Head ears eyes nose mouth and throat-does not complaining of sore throat nasal discharge or visual changes.  Respiratory-no complaints of shortness of breath or cough.   Cardiac--no chest pain has some baseline lower extremity edema.  GI-denies any nausea vomiting diarrhea constipation or abdominal pain.  GU no complaints of dysuria.  Muscle skeletal-has complained of some leg pain in the past but does not complaining of that today the Neurontin appears to help  .  Physical exam.   Dr. 97.9 pulse 82 respirations 18 blood pressure 128/81 weight is 240  .  In general this is a well-nourished elderly female in no distress sitting in her wheelchair .  Her skin is warm and dry.  Eyes pupils equal round react to light sclera and conjunctiva are clear.  Chest is clear to auscultation without rhonchi rales or wheezes.  Heart regular rate and rhythm with a A999333 systolic murmur-she has baseline lower extremity edema obese changes +.she is wearing hose--  Abdomen is soft nontender with positive bowel sounds it is obese.  Muscle skeletal moves all extremities at baseline ambulates at times with a walker--I do not note any deformity strength appears to be intact all 4 extremities  Neurologic-grossly intact speech is clear-- no lateralizing findings  .  Labs   10/07/2013. [id number].  Hemoglobin A1c 7.3.  WBC 4.6 hemoglobin 10.9 platelets 220.  Sodium 144 potassium 4.4 BUN 9 creatinine 0.9-.  Liver function tests within normal limits except albumin of 3.1 07/22/2013.  Cholesterol 152 triglycerides 109 HDL 33 LDL 97.  06/12/2013.  Hemoglobin A1c 6.8.  Sodium 143 potassium 4.3 BUN 8 creatinine 0.82.  WBC 4.4 hemoglobin 10.9 platelets 228  03/25/2013.  [id number].  WBC 5.2 hemoglobin 11.3 platelets 225.  Sodium 142 potassium 3.9 BUN 8 creatinine 0.88.  Liver function tests within normal limits except albumin of 3.8.  12/12/2012.  Cholesterol 155-triglycerides 104-LDL 98-HDL 36-.  Hemoglobin A1c--7.1.  CBC 4.6 hemoglobin 11.3 platelets 215.  Sodium 143 potassium 4 BUN 11 creatinine 0.98.  Marland Kitchen  08/31/2012.   Hemoglobin A1c 6.6.  WBC 4.4 hemoglobin 11.7 platelets 218.  Sodium 144 potassium 4.2 BUN 9 creatinine 0.93.  Bilirubin 0.2-albumin 3.2 otherwise liver function tests within normal limits  .  08/01/2012.  [id number].  2- 24th 2014.  WBC 4.7 hemoglobin 10.9 platelets 233.  Sodium 141 potassium 4.2 BUN 9 creatinine 0.88  05/14/2012.  Cholesterol 150 triglycerides 109 HDL 37 LDL 91.  03/07/2012.  Hemoglobin A1c 7.1.  Assessment and plan.  #1-peripheral vascular disease-this appears stable she is on Pletal as well as aspirin .  #2-anemia-this is been stable --Will recheck CBC.   #3 hypothyroidism this appears stable on Synthroid she will need an updated TSH -- .  #4-dementia-appears stable this is quite mild she is on Namenda as well as Aricept-she continues to function quite well in this setting .  Numbers 5-history of pulmonary embolism-this is remote there is been no recurrence she is on anticoagulation aspirin and Pleta l.  #6 status hypertension-appears stable is on low-dose lisinopril   #7-hyperlipidemia--this has been stable - she is on pravastatin-this has been stable in the past We'll update liver function testslipid panel  #8-diabetes type 2-this appearsrelatively Stable--on Glucophage.--Cbgs run largely in lower--mid 100's --We'll update hemoglobin A1c  #9-edema-appears stable she is on low dose Lasix with potassium Will update metabolic panel.   #10 -- history of neuropathy-is on Neurontin this appears to be stable.   #11-history of osteoporosis she is on calcium supplementation.   #12-history of GERD she is on PPI and this is stable as well   History of osteoarthritis-she is stable she does receive Tylenol for pain which appears to be effective  .  [id number]

## 2014-01-31 LAB — GLUCOSE, CAPILLARY
GLUCOSE-CAPILLARY: 148 mg/dL — AB (ref 70–99)
Glucose-Capillary: 120 mg/dL — ABNORMAL HIGH (ref 70–99)

## 2014-02-03 LAB — GLUCOSE, CAPILLARY
Glucose-Capillary: 138 mg/dL — ABNORMAL HIGH (ref 70–99)
Glucose-Capillary: 196 mg/dL — ABNORMAL HIGH (ref 70–99)

## 2014-02-07 LAB — GLUCOSE, CAPILLARY
GLUCOSE-CAPILLARY: 154 mg/dL — AB (ref 70–99)
Glucose-Capillary: 156 mg/dL — ABNORMAL HIGH (ref 70–99)

## 2014-02-10 LAB — GLUCOSE, CAPILLARY
GLUCOSE-CAPILLARY: 141 mg/dL — AB (ref 70–99)
Glucose-Capillary: 129 mg/dL — ABNORMAL HIGH (ref 70–99)

## 2014-02-14 LAB — GLUCOSE, CAPILLARY
GLUCOSE-CAPILLARY: 143 mg/dL — AB (ref 70–99)
GLUCOSE-CAPILLARY: 104 mg/dL — AB (ref 70–99)

## 2014-02-17 LAB — GLUCOSE, CAPILLARY
Glucose-Capillary: 130 mg/dL — ABNORMAL HIGH (ref 70–99)
Glucose-Capillary: 122 mg/dL — ABNORMAL HIGH (ref 70–99)

## 2014-02-21 LAB — GLUCOSE, CAPILLARY
GLUCOSE-CAPILLARY: 172 mg/dL — AB (ref 70–99)
Glucose-Capillary: 146 mg/dL — ABNORMAL HIGH (ref 70–99)

## 2014-02-24 LAB — GLUCOSE, CAPILLARY
Glucose-Capillary: 140 mg/dL — ABNORMAL HIGH (ref 70–99)
Glucose-Capillary: 158 mg/dL — ABNORMAL HIGH (ref 70–99)

## 2014-02-28 LAB — GLUCOSE, CAPILLARY
GLUCOSE-CAPILLARY: 117 mg/dL — AB (ref 70–99)
Glucose-Capillary: 154 mg/dL — ABNORMAL HIGH (ref 70–99)

## 2014-03-03 LAB — GLUCOSE, CAPILLARY
GLUCOSE-CAPILLARY: 136 mg/dL — AB (ref 70–99)
GLUCOSE-CAPILLARY: 122 mg/dL — AB (ref 70–99)

## 2014-03-05 LAB — GLUCOSE, CAPILLARY: GLUCOSE-CAPILLARY: 100 mg/dL — AB (ref 70–99)

## 2014-03-06 ENCOUNTER — Non-Acute Institutional Stay (SKILLED_NURSING_FACILITY): Payer: Medicare Other | Admitting: Internal Medicine

## 2014-03-06 ENCOUNTER — Ambulatory Visit (HOSPITAL_COMMUNITY): Payer: Medicare Other | Attending: Internal Medicine

## 2014-03-06 DIAGNOSIS — R059 Cough, unspecified: Secondary | ICD-10-CM

## 2014-03-06 DIAGNOSIS — F0390 Unspecified dementia without behavioral disturbance: Secondary | ICD-10-CM

## 2014-03-06 DIAGNOSIS — R0602 Shortness of breath: Secondary | ICD-10-CM | POA: Insufficient documentation

## 2014-03-06 DIAGNOSIS — E119 Type 2 diabetes mellitus without complications: Secondary | ICD-10-CM

## 2014-03-06 DIAGNOSIS — I10 Essential (primary) hypertension: Secondary | ICD-10-CM

## 2014-03-06 DIAGNOSIS — R05 Cough: Secondary | ICD-10-CM

## 2014-03-06 DIAGNOSIS — E039 Hypothyroidism, unspecified: Secondary | ICD-10-CM

## 2014-03-06 DIAGNOSIS — R609 Edema, unspecified: Secondary | ICD-10-CM

## 2014-03-06 DIAGNOSIS — I739 Peripheral vascular disease, unspecified: Secondary | ICD-10-CM

## 2014-03-07 LAB — GLUCOSE, CAPILLARY
Glucose-Capillary: 125 mg/dL — ABNORMAL HIGH (ref 70–99)
Glucose-Capillary: 137 mg/dL — ABNORMAL HIGH (ref 70–99)

## 2014-03-09 ENCOUNTER — Encounter: Payer: Self-pay | Admitting: Internal Medicine

## 2014-03-09 DIAGNOSIS — F03918 Unspecified dementia, unspecified severity, with other behavioral disturbance: Secondary | ICD-10-CM

## 2014-03-09 DIAGNOSIS — F0391 Unspecified dementia with behavioral disturbance: Secondary | ICD-10-CM

## 2014-03-09 NOTE — Progress Notes (Signed)
Patient ID: Brandi Rice, female   DOB: Aug 21, 1921, 80 y.o.   MRN: [id number]  This encounter was created in error - please disregard.

## 2014-03-09 NOTE — Progress Notes (Signed)
Patient ID: Brandi Rice, female   DOB: 1921-11-11, 80 y.o.   MRN: [id number] Patient ID: Brandi Rice, female   DOB: 16-May-1921, 80 y.o.   MRN: [id number]   This is a routine visit.  Level of care skilled.  Facility [facility].   Chief complaint-medical management of anemia hyperlipidemia dementia diabetes neuropathy edema hypothyroidism peripheral vascular disease -acute visit secondary to cough-congestion .  History of present illness  patient is a  pleasant elderly resident with the above diagnosis-. --She does complain of a cough for the past several days denies any increase shortness of breath apparently the cough is productive of yellow phlegm Has not really had any significant acute recent issues otherwise to my knowledge although she has numerous medical diagnoses.  Her weight has some variability but does not appear to be grossly changed from her baseline she initially was 247 in December went down to2 34 in March-- most recent weight is 237 this month -she does eat well she does have a history of eating snacks  does have a history of lower extremity edema--this appears relatively baseline .  She does have a history of pulmonary embolism t she is on aspirin .  She's also with a history of peripheral vascular disease.and is on Pletal  She also has a listed history of hypertension -she is on lisinopril--blood pressure97/61--since lisinopril is low-dose suspect we can discontinue this  s  .  Also a history of peripheral neuropathy that is stable on Neurontin.  She is a type II diabetic on Glucophage her blood sugars appear to be satisfactory in the low 100s-- hemoglobin A1c was 7.5 in November.  TSH done in November was 1.847 she is on Synthroid with a listed history of hypothyroidism  .  Family medical social history has been reviewed per discharge summary on 10/28/2008  .  Medications have been reviewed per MAR  .  Review of systems.  General she is denying any  fever or chills her weight has been relatively stable .  Skin-denies issues.  Head ears eyes nose mouth and throat-does not complaining of sore throat nasal discharge or visual changes.  Respiratory-no complaints of shortness of breath --but does have a cough  Cardiac--no chest pain has some baseline lower extremity edema.  GI-denies any nausea vomiting diarrhea constipation or abdominal pain.  GU no complaints of dysuria.  Muscle skeletal-has complained of some leg pain in the past but does not complaining of that today the Neurontin appears to help Neurologic does not complain of any headache dizziness or syncopal-type episodes O's.  Psych does have a history of dementia this appears to be mild-to-moderate she is doing well in this facility  .  Physical exam.   Temperature 90.4 pulse 84 respirations 20 blood pressure 97/61 O2 saturation 92% on room air--weight is relatively stable at 237 .  In general this is a well-nourished elderly female in no distress  .  Her skin is warm and dry.  Eyes pupils equal round react to light sclera and conjunctiva are clear Oropharynx is clear mucous membranes moist.  Chest --scattered bronchial sounds there is no labored breathing.  Heart regular rate and rhythm with a [id number] systolic murmur-she has baseline lower extremity edema obese changes--  Abdomen is soft nontender with positive bowel sounds it is obese.  Muscle skeletal moves all extremities at baseline ambulates at times with a walker--I do not note any deformity strength appears to be intact all 4 extremities  Neurologic-grossly intact speech  is clear-- no lateralizing findings   Psych she is oriented to self place I would say her dementia is mild/moderate   .  Labs  01/30/2014.  Cholesterol 191-triglycerides 122-HDL 35-LDL 132.  [id number].  Hemoglobin A1c 7.5.  WBC 5.0 hemoglobin 11.5 platelets 240.  Sodium 145 potassium 4.8 BUN 10 creatinine 1.1.  Liver  function tests within normal limits except albumin of 3.2   10/07/2013. [id number].  Hemoglobin A1c 7.3.  WBC 4.6 hemoglobin 10.9 platelets 220.  Sodium 144 potassium 4.4 BUN 9 creatinine 0.9-.  Liver function tests within normal limits except albumin of 3.1 07/22/2013.  Cholesterol 152 triglycerides 109 HDL 33 LDL 97.  06/12/2013.  Hemoglobin A1c 6.8.  Sodium 143 potassium 4.3 BUN 8 creatinine 0.82.  WBC 4.4 hemoglobin 10.9 platelets 228  03/25/2013.  [id number].  WBC 5.2 hemoglobin 11.3 platelets 225.  Sodium 142 potassium 3.9 BUN 8 creatinine 0.88.  Liver function tests within normal limits except albumin of 3.8.  12/12/2012.  Cholesterol 155-triglycerides 104-LDL 98-HDL 36-.  Hemoglobin A1c--7.1.  CBC 4.6 hemoglobin 11.3 platelets 215.  Sodium 143 potassium 4 BUN 11 creatinine 0.98.  Marland Kitchen  08/31/2012.  Hemoglobin A1c 6.6.  WBC 4.4 hemoglobin 11.7 platelets 218.  Sodium 144 potassium 4.2 BUN 9 creatinine 0.93.  Bilirubin 0.2-albumin 3.2 otherwise liver function tests within normal limits  .  08/01/2012.  [id number].  2- 24th 2014.  WBC 4.7 hemoglobin 10.9 platelets 233.  Sodium 141 potassium 4.2 BUN 9 creatinine 0.88  05/14/2012.  Cholesterol 150 triglycerides 109 HDL 37 LDL 91.  03/07/2012.  Hemoglobin A1c 7.1.  Assessment and plan.  #1-peripheral vascular disease-this appears stable she is on Pletal as well as aspirin .  #2-anemia-this is been stable --Will recheck CBC.   #3 hypothyroidism this appears stable on Synthroid  -- .  #4-dementia-appears stable this is quite mild she is on Namenda as well as Aricept-she continues to function quite well in this setting .  Numbers 5-history of pulmonary embolism-this is remote there is been no recurrence she is on anticoagulation aspirin and Pletal l.  #6 status hypertension-as noted above systolics occasionally low Will DC the lisinopril  #7-hyperlipidemia--this has been  relatively stable-somewhat conservative here secondary to patient's advanced age  #8-diabetes type 2-this appearsrelatively Stable--on Glucophage.--Cbgs run largely in lower--mid 100's --hemoglobin A1c shows relative stability   #9-edema-appears stable she is on low dose Lasix with potassium Will update metabolic panel---hold Lasix for systolic blood pressure less than 100.   #10 -- history of neuropathy-is on Neurontin this appears to be stable.   #11-history of osteoporosis she is on calcium supplementation.   #12-history of GERD she is on PPI and this is stable as well   History of osteoarthritis-she is stable she does receive Tylenol for pain which appears to be effective    14 cough with chest congestion-new problem we will order Mucinex 600 mg twice a day for 5 days-also monitor vital signs pulse ox every shift for 72 hours-will check a chest x-ray as well.  Also will order nebs every 6 hours when necessary for 72 hours    .  [id number] note greater than 35 minutes spent assessing patient-discussing her status with nursing staff-reviewing her chart-and coordinating and formulating a plan of care for numerous diagnoses-of note greater than 50% of time spent coordinating plan of care

## 2014-03-10 LAB — GLUCOSE, CAPILLARY
Glucose-Capillary: 163 mg/dL — ABNORMAL HIGH (ref 70–99)
Glucose-Capillary: 101 mg/dL — ABNORMAL HIGH (ref 70–99)

## 2014-03-11 ENCOUNTER — Ambulatory Visit (HOSPITAL_COMMUNITY): Payer: Medicare Other | Attending: Internal Medicine

## 2014-03-11 ENCOUNTER — Encounter: Payer: Self-pay | Admitting: Internal Medicine

## 2014-03-11 ENCOUNTER — Non-Acute Institutional Stay (SKILLED_NURSING_FACILITY): Payer: Medicare Other | Admitting: Internal Medicine

## 2014-03-11 DIAGNOSIS — R05 Cough: Secondary | ICD-10-CM | POA: Insufficient documentation

## 2014-03-11 DIAGNOSIS — I517 Cardiomegaly: Secondary | ICD-10-CM | POA: Diagnosis not present

## 2014-03-11 DIAGNOSIS — J984 Other disorders of lung: Secondary | ICD-10-CM | POA: Insufficient documentation

## 2014-03-11 DIAGNOSIS — R531 Weakness: Secondary | ICD-10-CM

## 2014-03-11 DIAGNOSIS — R0989 Other specified symptoms and signs involving the circulatory and respiratory systems: Secondary | ICD-10-CM

## 2014-03-11 DIAGNOSIS — I771 Stricture of artery: Secondary | ICD-10-CM | POA: Insufficient documentation

## 2014-03-11 DIAGNOSIS — R059 Cough, unspecified: Secondary | ICD-10-CM | POA: Insufficient documentation

## 2014-03-11 NOTE — Progress Notes (Signed)
Patient ID: Brandi Rice, female   DOB: [birth year], 80 y.o.   MRN: [id number]   this is an acute visit.  Level care skilled.  Facility [facility].  Chief complaint-acute visit follow-up cough weakness chest congestion.  History of present illness.  Patient is a pleasant 80 year old female who has been a long-term resident of this facility-she was seen last week for a routine visit and also noted to have a cough productive of some yellow phlegm --a chest x-ray was ordered which did not show any acute process-blood work also did not show an elevated white count and was stable for her with previous labs.--She is completing a course of Mucinex  Apparently she's had a somewhat difficult weekend nursing staff has noted some increased weakness-the patient is staying more in bed which is not like her-she also continues to have a cough--she continues to be afebrile vital signs are stable-however was noted that on room air her O2 sats vary from 88-91 percent but quickly rose up into the high 90s on 2 L of oxygen.  Currently she denies any acute shortness of breath or discomfort--apparently over the weekend she did have some shortness of breath with exertion- does complain generally of being weak and feeling, sore-also notes a frontal headache she says this is not totally new has been present for several days and comes and goes-she did not complain of any visual changes dizziness syncopal-type feelings chest pain.  EKG also was done this morning which showed normal sinus rhythm  I know she does have some history of questionable pneumonia in the past-appears back in August 2010 she was treated with Avelox apparently with good results-at that time apparently chest x-ray was nondiagnostic but her white count was mildly elevated.  Family medical social history has been reviewed per discharge summary on 10/28/2008.  Medications have been reviewed per MAR.  Review of systems.  General she denies any  fever or chills says she does feel weak with some intermittent headache as noted above.  Skin does not complain of any itching or rashes currently.  Head ears eyes nose mouth and throat-only complaint is a frontal headache that comes and goes over the past several days.  Respiratory does have a cough productive of yellow-clear phlegm does not complain of significant shortness of breath--however over the weekend nursing staff has noticed some shortness of breath with exertion.  Cardiac does not complain of any chest pain has baseline lower extremity edema.  GI says her appetite is not that great but does not complain of nausea or vomiting or abdominal pain.  GI does not complain of dysuria.  Muscle skeletal-does not complain of joint pain currently says she just kind of feels achy.  Neurologic does not complain of dizziness headache or syncopal-type episodes.  Psych does have history of mild dementia which appears to be baseline staff she was somewhat more confused last few days.  Physical exam.  She is afebrile-pulse of 80 respirations 18 blood pressure 145/73.  O2 saturation initially 88--91% on room air in the high 90s on 2 L oxygen.  In general this is a pleasant elderly female in no distress resting comfortably in bed.  Her skin is warm and dry.  Eyes pupils appear reactive to light sclera and conjunctiva are clear visual acuity appears grossly intact.  Head ears eyes nose mouth and throat-does have some slight tenderness to palpation when the 4 head is palpated-her oropharynx is clear mucous membranes are moist  Tongue is midline.  Chest she does have some scattered coarse breath sounds rhonchi that improves fairly significantly with cough there is no labored breathing.  Heart is regular rate and rhythm without murmur gallop or rub she has baseline lower extremity edema she has compression holes on bilaterally.  Abdomen soft obese soft nontender with positive bowel  sounds.  Musculoskeletal moves all her extremities at baseline has bilateral equal grip strength is able to raise her arms without difficulty as well as her legs.  Neurologic cranial nerves are grossly intact her speech is clear I do not see any lateralizing findings.  Psych she is oriented to self is pleasant appropriate appeared to be herself when I spoke with her joking somewhat which apparently is an improvement from previous days labs.  03/07/2014.  WBC 7.3 hemoglobin 11.4 platelets 281.  Sodium 141 potassium 4.6 BUN 10 creatinine 1.08 CO2 level XXXI.  Assessment and plan.  Cough with some congestion-and increased weakness-possibly a bronchitis sinusitis situation with a frontal headache-will treat aggressively with antibiotic Avelox 400 mg a day for 7 days-also will extend the Mucinex for 5 more days-600 mg twice a day.  Also will obtain an updated chest x-ray--. As well as a CBC with differential to see where the white count stands.  I also encouraged her to use her nebulizers I will will schedule duo nebs routinely every 6 hours for 72 hours and then when necessary I did speak with her about the importance of trying to use these.  Again she will have to be closely monitored vital signs pulse ox every shift-with neuro checks.   addendum.  Of note we have obtained updated labs these appear to be stable WBC of 7.8 hemoglobin 11.9 platelets of 298.  Sodium 143 potassium 4.2 BUN 10 creatinine 1.14  Chest x-ray did not show any acute process  Vital signs remained stable  temperature is 98.3 O2 saturation is 97% on room air.  I did reassess later this evening patient she appeared  stable somewhat weak with a flatter affect than usual but otherwise appears to be at baseline per  physical exam Will need to be monitored closely.  [id number] note greater than 35 minutes spent assessing patient reviewing her chart-discussing her status with nursing staff-as well as reevaluating  resident later in the day-and coordinating a plan of care-- of note greater than 50% of time spent coordinating plan of care-

## 2014-03-12 ENCOUNTER — Non-Acute Institutional Stay (SKILLED_NURSING_FACILITY): Payer: Medicare Other | Admitting: Internal Medicine

## 2014-03-12 DIAGNOSIS — R531 Weakness: Secondary | ICD-10-CM

## 2014-03-12 DIAGNOSIS — R059 Cough, unspecified: Secondary | ICD-10-CM

## 2014-03-12 DIAGNOSIS — R05 Cough: Secondary | ICD-10-CM

## 2014-03-13 LAB — GLUCOSE, CAPILLARY: Glucose-Capillary: 161 mg/dL — ABNORMAL HIGH (ref 70–99)

## 2014-03-14 LAB — GLUCOSE, CAPILLARY
Glucose-Capillary: 101 mg/dL — ABNORMAL HIGH (ref 70–99)
Glucose-Capillary: 123 mg/dL — ABNORMAL HIGH (ref 70–99)

## 2014-03-17 LAB — GLUCOSE, CAPILLARY
GLUCOSE-CAPILLARY: 116 mg/dL — AB (ref 70–99)
GLUCOSE-CAPILLARY: 106 mg/dL — AB (ref 70–99)

## 2014-03-17 NOTE — Progress Notes (Signed)
Patient ID: Brandi Rice, female   DOB: 15-Oct-1921, 80 y.o.   MRN: [id number]   this is an acute visit.  Level care skilled.  Facility [facility].  Chief complaint-acute visit follow-up  weakness chest congestion.  History of present illness.  Patient is a pleasant 80 year old female who has been a long-term resident of this facility-she was seen last week for a routine visit and also noted to have a cough productive of some yellow phlegm --a chest x-ray was ordered which did not show any acute process-blood work also did not show an elevated white count and was stable for her with previous labs.--   I saw her yesterday after a difficult weekend where she apparently had a decreased oxygen saturation and increased weakness and lethargy-lab work and chest x-ray done yesterday actually were unremarkable she is completing a course of Mucinex and was started on Avelox for possible bronchitis sinusitis--I'm following up on this today-her O2 sats continuedto be in the 90s it appears now even off room air--- a urinalysis and culture also is pending.    Currently she denies any acute shortness of breath or discomfort--although she continues to complain of weakness  Vital signs remain stable .     Family medical social history has been reviewed per discharge summary on 10/28/2008.  Medications have been reviewed per MAR.  Review of systems.  General she denies any fever or chills says she does feel weak .  Skin does not complain of any itching or rashes currently.  Head ears eyes nose mouth and throat-only complaint is a frontal headache that comes and goes over the past several days.  Respiratory does have a cough at times but does not really complain of shortness of breath today   Cardiac does not complain of any chest pain has baseline lower extremity edema.  GI says her appetite is not that great but does not complain of nausea or vomiting or abdominal pain.  GI does not  complain of dysuria.  Muscle skeletal-does not complain of joint pain currently says she just kind of feels achy.  Neurologic does not complain of dizziness headache or syncopal-type episodes.  Psych does have history of mild dementia which appears to be baseline staff she was somewhat more confused last few days--although she seems a bit more herself today.  Physical exam. Vital signs remained stable with an O2 saturation in the low 90s on room air-pulse 78 she is afebrile .  In general this is a pleasant elderly female in no distress resting comfortably in bed.  Her skin is warm and dry.  Eyes pupils appear reactive to light sclera and conjunctiva are clear visual acuity appears grossly intact.  Head ears eyes nose mouth and throat -her oropharynx is clear mucous membranes are moist--does not complain of headache today  Tongue is midline.  Chest  No labored breathing her breath sounds are clear today.  Heart is regular rate and rhythm without murmur gallop or rub she has baseline lower extremity edema she has compression holes on bilaterally.  Abdomen soft obese soft nontender with positive bowel sounds.  Musculoskeletal moves all her extremities at baseline has bilateral equal grip strength is able to raise her arms without difficulty as well as her legs.  Neurologic cranial nerves are grossly intact her speech is clear I do not see any lateralizing findings.  Psych she is oriented to self is pleasant appropriate she gradually appears to be more herself a little bit more animated .  Labs.  03/11/2014.  WBC 7.8 hemoglobin 11.9 platelets 298.  Sodium 143 potassium 4.2 BUN 10 creatinine 1.14  03/07/2014.  WBC 7.3 hemoglobin 11.4 platelets 281.  Sodium 141 potassium 4.6 BUN 10 creatinine 1.08 CO2 level XXXI.  Assessment and plan.  Cough with some congestion-and increased weakness-possibly a bronchitis sinusitis --has appears to be improved-chest x-ray again done  earlier this week did not really show any acute process-she is on a course of Avelox and finishing a course of Mucinex this appears to be gradually improving.  She appears to be more herself today-still has vague complaints of weakness but this appears to be improving somewhat as well.  At this point continue to monitor  [id number] .   -

## 2014-03-21 LAB — GLUCOSE, CAPILLARY
GLUCOSE-CAPILLARY: 101 mg/dL — AB (ref 70–99)
Glucose-Capillary: 134 mg/dL — ABNORMAL HIGH (ref 70–99)

## 2014-03-24 LAB — GLUCOSE, CAPILLARY
Glucose-Capillary: 104 mg/dL — ABNORMAL HIGH (ref 70–99)
Glucose-Capillary: 89 mg/dL (ref 70–99)

## 2014-03-28 LAB — GLUCOSE, CAPILLARY
Glucose-Capillary: 106 mg/dL — ABNORMAL HIGH (ref 70–99)
Glucose-Capillary: 146 mg/dL — ABNORMAL HIGH (ref 70–99)

## 2014-03-31 LAB — GLUCOSE, CAPILLARY
GLUCOSE-CAPILLARY: 154 mg/dL — AB (ref 70–99)
Glucose-Capillary: 128 mg/dL — ABNORMAL HIGH (ref 70–99)

## 2014-04-04 LAB — GLUCOSE, CAPILLARY
GLUCOSE-CAPILLARY: 115 mg/dL — AB (ref 70–99)
Glucose-Capillary: 125 mg/dL — ABNORMAL HIGH (ref 70–99)

## 2014-04-07 LAB — GLUCOSE, CAPILLARY
Glucose-Capillary: 112 mg/dL — ABNORMAL HIGH (ref 70–99)
Glucose-Capillary: 154 mg/dL — ABNORMAL HIGH (ref 70–99)

## 2014-04-11 LAB — GLUCOSE, CAPILLARY
Glucose-Capillary: 108 mg/dL — ABNORMAL HIGH (ref 70–99)
Glucose-Capillary: 107 mg/dL — ABNORMAL HIGH (ref 70–99)

## 2014-04-14 LAB — GLUCOSE, CAPILLARY
GLUCOSE-CAPILLARY: 137 mg/dL — AB (ref 70–99)
Glucose-Capillary: 143 mg/dL — ABNORMAL HIGH (ref 70–99)

## 2014-04-18 LAB — GLUCOSE, CAPILLARY
GLUCOSE-CAPILLARY: 113 mg/dL — AB (ref 70–99)
Glucose-Capillary: 111 mg/dL — ABNORMAL HIGH (ref 70–99)

## 2014-04-21 LAB — GLUCOSE, CAPILLARY
GLUCOSE-CAPILLARY: 109 mg/dL — AB (ref 70–99)
GLUCOSE-CAPILLARY: 147 mg/dL — AB (ref 70–99)

## 2014-04-25 LAB — GLUCOSE, CAPILLARY
Glucose-Capillary: 100 mg/dL — ABNORMAL HIGH (ref 70–99)
Glucose-Capillary: 137 mg/dL — ABNORMAL HIGH (ref 70–99)

## 2014-04-28 LAB — GLUCOSE, CAPILLARY
Glucose-Capillary: 105 mg/dL — ABNORMAL HIGH (ref 70–99)
Glucose-Capillary: 135 mg/dL — ABNORMAL HIGH (ref 70–99)

## 2014-05-02 LAB — GLUCOSE, CAPILLARY
GLUCOSE-CAPILLARY: 111 mg/dL — AB (ref 70–99)
Glucose-Capillary: 135 mg/dL — ABNORMAL HIGH (ref 70–99)

## 2014-05-05 LAB — GLUCOSE, CAPILLARY: Glucose-Capillary: 119 mg/dL — ABNORMAL HIGH (ref 70–99)

## 2014-05-23 ENCOUNTER — Encounter: Payer: Self-pay | Admitting: Internal Medicine

## 2014-05-23 ENCOUNTER — Non-Acute Institutional Stay (SKILLED_NURSING_FACILITY): Payer: Medicare Other | Admitting: Internal Medicine

## 2014-05-23 DIAGNOSIS — I10 Essential (primary) hypertension: Secondary | ICD-10-CM

## 2014-05-23 DIAGNOSIS — F0390 Unspecified dementia without behavioral disturbance: Secondary | ICD-10-CM

## 2014-05-23 DIAGNOSIS — E038 Other specified hypothyroidism: Secondary | ICD-10-CM

## 2014-05-23 DIAGNOSIS — E119 Type 2 diabetes mellitus without complications: Secondary | ICD-10-CM

## 2014-05-23 DIAGNOSIS — I739 Peripheral vascular disease, unspecified: Secondary | ICD-10-CM

## 2014-05-23 NOTE — Progress Notes (Signed)
Patient ID: Brandi Rice, female   DOB: Jun 22, 1921, 81 y.o.   MRN: [id number]   This is a routine visit.  Level of care skilled.  Facility [facility].   Chief complaint-medical management of anemia hyperlipidemia dementia diabetes neuropathy edema hypothyroidism peripheral vascular disease -depression .  History of present illness   Patient is a pleasant 81 year old female is been a long-term resident at this facility-she has been stable recently most acute issue has been some suspicion of depression with somewhat of a more flat affect staying in her room more-she has been seen by psychiatric services and started on Lexapro-she appears to be pretty much her self this evening she is interactive and talkative laughing-apparently this is improving some.  She was also seen back in December for suspected bronchitis weakness some hypoxia this appears resolved.   -she did receive a course of antibiotics.  Her weight has some variability  Last weight I see listed is 225 which is actually down about 10 pounds over the past few months I suspect there may be some scale variation she does snack recurrently will have to continue to monitor   does have a history of lower extremity edema--this appears relatively baseline .  She does have a history of pulmonary embolism t she is on aspirin .  She's also with a history of peripheral vascular disease.and is on Pletal  She also has a listed history of hypertension - She was on lisinopril but had some systolics in the 0000000 lisinopril was discontinued most recent blood pressure 134/72   s  .  Also a history of peripheral neuropathy that is stable on Neurontin.  She is a type II diabetic on Glucophage her blood sugars appear to be satisfactory in the low 100s-- hemoglobin A1c was 7.5 in November.  TSH done in November was 1.847 she is on Synthroid with a listed history of hypothyroidism  .  Family medical social history has been reviewed per discharge  summary on 10/28/2008  .  Medications have been reviewed per MAR  .  Review of systems.  General she is denying any fever or chills her weight has some variability as noted above  .  Skin-denies issues.  Head ears eyes nose mouth and throat-does not complaining of sore throat nasal discharge or visual changes.  Respiratory-no complaints of shortness of breath - Cardiac--no chest pain has some baseline lower extremity edema.  GI-denies any nausea vomiting diarrhea constipation or abdominal pain.  GU no complaints of dysuria.  Muscle skeletal-has complained of some leg pain in the past but does not complaining of that today the Neurontin appears to help Neurologic does not complain of any headache dizziness or syncopal-type episodes   Psych does have a history of dementia this appears to be mild-to-moderate she is doing well in this facility  .  Physical exam.    She is afebrile pulse 70 respirations of 19 blood pressure 134/72 weight is 225.2 as noted above there is some variability here  .  In general this is a well-nourished elderly female in no distress  .  Her skin is warm and dry.  Eyes pupils equal round react to light sclera and conjunctiva are clear Oropharynx is clear mucous membranes moist.  Chest -CTA- there is no labored breathing.  Heart regular rate and rhythm with a [id number] systolic murmur-she has baseline lower extremity edema obese changes--  Abdomen is soft nontender with positive bowel sounds it is obese.  Muscle skeletal moves all extremities  at baseline ambulates at times with a walker--I do not note any deformity strength appears to be intact all 4 extremities  Neurologic-grossly intact speech is clear-- no lateralizing findings   Psych she is oriented to self place I would say her dementia is mild/moderate   .  Labs  04/02/2014.  Vitamin D 27.  03/14/2014.  WBC 7.8 hemoglobin 11.9 platelets 298.  Sodium 143 potassium 4.2 BUN  10 creatinine 1.14   01/30/2014.  Cholesterol 191-triglycerides 122-HDL 35-LDL 132.  [id number].  Hemoglobin A1c 7.5.  WBC 5.0 hemoglobin 11.5 platelets 240.  Sodium 145 potassium 4.8 BUN 10 creatinine 1.1.  Liver function tests within normal limits except albumin of 3.2   10/07/2013. [id number].  Hemoglobin A1c 7.3.  WBC 4.6 hemoglobin 10.9 platelets 220.  Sodium 144 potassium 4.4 BUN 9 creatinine 0.9-.  Liver function tests within normal limits except albumin of 3.1 07/22/2013.  Cholesterol 152 triglycerides 109 HDL 33 LDL 97.  06/12/2013.  Hemoglobin A1c 6.8.  Sodium 143 potassium 4.3 BUN 8 creatinine 0.82.  WBC 4.4 hemoglobin 10.9 platelets 228  03/25/2013.  [id number].  WBC 5.2 hemoglobin 11.3 platelets 225.  Sodium 142 potassium 3.9 BUN 8 creatinine 0.88.  Liver function tests within normal limits except albumin of 3.8.  12/12/2012.  Cholesterol 155-triglycerides 104-LDL 98-HDL 36-.  Hemoglobin A1c--7.1.  CBC 4.6 hemoglobin 11.3 platelets 215.  Sodium 143 potassium 4 BUN 11 creatinine 0.98.  Marland Kitchen  08/31/2012.  Hemoglobin A1c 6.6.  WBC 4.4 hemoglobin 11.7 platelets 218.  Sodium 144 potassium 4.2 BUN 9 creatinine 0.93.  Bilirubin 0.2-albumin 3.2 otherwise liver function tests within normal limits  .  08/01/2012.  [id number].  2- 24th 2014.  WBC 4.7 hemoglobin 10.9 platelets 233.  Sodium 141 potassium 4.2 BUN 9 creatinine 0.88  05/14/2012.  Cholesterol 150 triglycerides 109 HDL 37 LDL 91.  03/07/2012.  Hemoglobin A1c 7.1.  Assessment and plan.  #1-peripheral vascular disease-this appears stable she is on Pletal as well as aspirin .  #2-anemia-this is been stable --Will recheck CBC.   #3 hypothyroidism this appears stable on Synthroid  -- .  #4-dementia-appears stable this is quite mild she is on Namenda as well as Aricept-she continues to function quite well in this setting .  Numbers 5-history of pulmonary  embolism-this is remote there is been no recurrence she is on anticoagulation aspirin and Pletal l.  #6 status hypertension-as noted above systolics occasionally low Will DC the lisinopril  #7-hyperlipidemia--this has been relatively stable-somewhat conservative here secondary to patient's advanced age  #8-diabetes type 2-this appearsrelatively Stable--on Glucophage.---hemoglobin A1c shows relative stability --will update  #9-edema-appears stable she is on low dose Lasix with potassium Will update metabolic panel-.   #10 -- history of neuropathy-is on Neurontin this appears to be stable.   #11-history of osteoporosis she is on calcium supplementation.   #12-history of GERD she is on PPI and this is stable as well   History of osteoarthritis-she is stable she does receive Tylenol for pain which appears to be effective      Her 14-depression-this is been addressed by psychiatric services she is on Lexapro this appears to be helping.  #15 weight loss questioned scale variation-there is some variability here continue to monitor also will check her albumin level.  [id number]

## 2014-05-26 ENCOUNTER — Encounter (HOSPITAL_COMMUNITY)
Admission: RE | Admit: 2014-05-26 | Discharge: 2014-05-26 | Disposition: A | Discharge status: Home / Self Care | Payer: Medicare Other | Source: Skilled Nursing Facility | Attending: Internal Medicine | Admitting: Internal Medicine

## 2014-05-26 DIAGNOSIS — E785 Hyperlipidemia, unspecified: Secondary | ICD-10-CM | POA: Diagnosis not present

## 2014-05-26 DIAGNOSIS — E119 Type 2 diabetes mellitus without complications: Secondary | ICD-10-CM | POA: Insufficient documentation

## 2014-05-26 DIAGNOSIS — E039 Hypothyroidism, unspecified: Secondary | ICD-10-CM | POA: Diagnosis not present

## 2014-05-26 LAB — CBC WITH DIFFERENTIAL/PLATELET
Basophils Absolute: 0 10*3/uL (ref 0.0–0.1)
Basophils Relative: 1 % (ref 0–1)
EOS ABS: 0.2 10*3/uL (ref 0.0–0.7)
Eosinophils Relative: 3 % (ref 0–5)
HCT: 37.1 % (ref 36.0–46.0)
Hemoglobin: 10.9 g/dL — ABNORMAL LOW (ref 12.0–15.0)
LYMPHS ABS: 1.9 10*3/uL (ref 0.7–4.0)
Lymphocytes Relative: 33 % (ref 12–46)
MCH: 24.5 pg — AB (ref 26.0–34.0)
MCHC: 29.4 g/dL — AB (ref 30.0–36.0)
MCV: 83.6 fL (ref 78.0–100.0)
MONO ABS: 0.5 10*3/uL (ref 0.1–1.0)
MONOS PCT: 9 % (ref 3–12)
Neutro Abs: 3.1 10*3/uL (ref 1.7–7.7)
Neutrophils Relative %: 55 % (ref 43–77)
PLATELETS: 228 10*3/uL (ref 150–400)
RBC: 4.44 MIL/uL (ref 3.87–5.11)
RDW: 14.5 % (ref 11.5–15.5)
WBC: 5.6 10*3/uL (ref 4.0–10.5)

## 2014-05-26 LAB — COMPREHENSIVE METABOLIC PANEL
ALK PHOS: 58 U/L (ref 39–117)
ALT: 7 U/L (ref 0–35)
AST: 15 U/L (ref 0–37)
Albumin: 3.1 g/dL — ABNORMAL LOW (ref 3.5–5.2)
Anion gap: 9 (ref 5–15)
BILIRUBIN TOTAL: 0.2 mg/dL — AB (ref 0.3–1.2)
BUN: 10 mg/dL (ref 6–23)
CHLORIDE: 101 mmol/L (ref 96–112)
CO2: 33 mmol/L — ABNORMAL HIGH (ref 19–32)
Calcium: 8.8 mg/dL (ref 8.4–10.5)
Creatinine, Ser: 0.89 mg/dL (ref 0.50–1.10)
GFR calc Af Amer: 64 mL/min — ABNORMAL LOW (ref >90)
GFR, EST NON AFRICAN AMERICAN: 55 mL/min — AB (ref >90)
Glucose, Bld: 121 mg/dL — ABNORMAL HIGH (ref 70–99)
POTASSIUM: 4.1 mmol/L (ref 3.5–5.1)
Sodium: 143 mmol/L (ref 135–145)
Total Protein: 6.3 g/dL (ref 6.0–8.3)

## 2014-05-27 LAB — HEMOGLOBIN A1C
Hgb A1c MFr Bld: 6.7 % — ABNORMAL HIGH (ref 4.8–5.6)
Mean Plasma Glucose: 146 mg/dL

## 2014-08-26 ENCOUNTER — Non-Acute Institutional Stay (SKILLED_NURSING_FACILITY): Payer: Medicare Other | Admitting: Internal Medicine

## 2014-08-26 ENCOUNTER — Encounter: Payer: Self-pay | Admitting: Internal Medicine

## 2014-08-26 DIAGNOSIS — E119 Type 2 diabetes mellitus without complications: Secondary | ICD-10-CM

## 2014-08-26 DIAGNOSIS — E038 Other specified hypothyroidism: Secondary | ICD-10-CM | POA: Diagnosis not present

## 2014-08-26 DIAGNOSIS — F0390 Unspecified dementia without behavioral disturbance: Secondary | ICD-10-CM | POA: Diagnosis not present

## 2014-08-26 DIAGNOSIS — I10 Essential (primary) hypertension: Secondary | ICD-10-CM | POA: Diagnosis not present

## 2014-08-26 NOTE — Progress Notes (Signed)
Patient ID: Brandi Rice, female   DOB: [birth year], 81 y.o.   MRN: [id number]      This is a routine visit.  Level of care skilled.  Facility [facility].   Chief complaint-medical management of anemia hyperlipidemia dementia diabetes neuropathy edema hypothyroidism peripheral vascular disease -depression .  History of present illness   Patient is a pleasant 81 year old female is been a long-term resident at this facility-she has been stable  There was concern for increased depressive symptoms flat affect earlier this year she was seen by psychiatric services and started on Lexapro this appears to have had a beneficial effect she is back more to her baseline somewhat boisterous interactive  She was also seen back in December for suspected bronchitis weakness some hypoxia this appears to have been transitory there is not really been any relapse.   -she did receive a course of antibiotics.   Her weight appears to be fairly stable at 232   does have a history of lower extremity edema--this appears relatively baseline .  She does have a history of pulmonary embolism t she is on aspirin .  She's also with a history of peripheral vascular disease.and is on Pletal  She also has a listed history of hypertension - She was on lisinopril but had some systolics in the 0000000 lisinopril was discontinued most recent blood pressure 140/69-132/78-138/76   s  .  Also a history of peripheral neuropathy that is stable on Neurontin.  She is a type II diabetic on Glucophage her blood sugars appear to be satisfactory in the low 100s-- hemoglobin A1c was is 0.7 back in February  TSH done in November was 1.847 she is on Synthroid with a listed history of hypothyroidism  .  Family medical social history has been reviewed per discharge summary on 10/28/2008  .  Medications have been reviewed per [facility]  Include Aeroseb-aspirin 81 mg daily-Lasix 20 mg a day-Lexapro 10 mg a day.  Calcium-.  Namenda  10 mg twice a day-Prilosec-plateau-potassium 10 mEq a day-Pravachol-vitamin D-do nebs when necessary-vitamin D-Synthroid 50 g daily-Glucophage 500 mg twice a day .  Review of systems.  General she is denying any fever or chills her weight appears to be relatively stable  .  Skin-denies issues.  Head ears eyes nose mouth and throat-does not complaining of sore throat nasal discharge or visual changes.  Respiratory-no complaints of shortness of breath - Cardiac--no chest pain has some baseline lower extremity edema.  GI-denies any nausea vomiting diarrhea constipation or abdominal pain.  GU no complaints of dysuria.  Muscle skeletal-has complained of some leg pain in the past but does not complaining of that today the Neurontin appears to help Neurologic does not complain of any headache dizziness or syncopal-type episodes   Psych does have a history of dementia this appears to be mild-to-moderate she is doing well in this facility  .  Physical exam.     Temperature is 98.4 pulse 76 respirations 20 blood pressure 140/69-132/78 in this range recently weight is 232 .  In general this is a well-nourished elderly female in no distress  .  Her skin is warm and dry.  Eyes pupils equal round react to light sclera and conjunctiva are clear Oropharynx is clear mucous membranes moist.  Chest -CTA- there is no labored breathing.  Heart regular rate and rhythm with a [id number] systolic murmur-she has baseline lower extremity edema obese changes--  Abdomen is soft nontender with positive bowel sounds it is obese.  Muscle skeletal moves all extremities at baseline ambulates at times with a walker--I do not note any deformity strength appears to be intact all 4 extremities  Neurologic-grossly intact speech is clear-- no lateralizing findings-grip strength is equal bilaterally shoulder shrug is intact   Psych she is oriented to self place I would say her dementia is  mild/moderate   .  Labs  05/26/2014.  WBC 5.6 hemoglobin 10.9 platelets 228.  Sodium 141 potassium 4.1 and CO2 33 creatinine 0.89 BUN 10.  Albumen 3.1 total bilirubin 0.2 otherwise liver function tests within normal limits.  Hemoglobin A1c-6.7  04/02/2014.  Vitamin D 27.  03/14/2014.  WBC 7.8 hemoglobin 11.9 platelets 298.  Sodium 143 potassium 4.2 BUN 10 creatinine 1.14   01/30/2014.  Cholesterol 191-triglycerides 122-HDL 35-LDL 132.  [id number].  Hemoglobin A1c 7.5.  WBC 5.0 hemoglobin 11.5 platelets 240.  Sodium 145 potassium 4.8 BUN 10 creatinine 1.1.  Liver function tests within normal limits except albumin of 3.2   10/07/2013. [id number].  Hemoglobin A1c 7.3.  WBC 4.6 hemoglobin 10.9 platelets 220.  Sodium 144 potassium 4.4 BUN 9 creatinine 0.9-.  Liver function tests within normal limits except albumin of 3.1 07/22/2013.  Cholesterol 152 triglycerides 109 HDL 33 LDL 97.  06/12/2013.  Hemoglobin A1c 6.8.  Sodium 143 potassium 4.3 BUN 8 creatinine 0.82.  WBC 4.4 hemoglobin 10.9 platelets 228  03/25/2013.  [id number].  WBC 5.2 hemoglobin 11.3 platelets 225.  Sodium 142 potassium 3.9 BUN 8 creatinine 0.88.  Liver function tests within normal limits except albumin of 3.8.  12/12/2012.  Cholesterol 155-triglycerides 104-LDL 98-HDL 36-.  Hemoglobin A1c--7.1.  CBC 4.6 hemoglobin 11.3 platelets 215.  Sodium 143 potassium 4 BUN 11 creatinine 0.98.  Marland Kitchen  08/31/2012.  Hemoglobin A1c 6.6.  WBC 4.4 hemoglobin 11.7 platelets 218.  Sodium 144 potassium 4.2 BUN 9 creatinine 0.93.  Bilirubin 0.2-albumin 3.2 otherwise liver function tests within normal limits  .  08/01/2012.  [id number].  2- 24th 2014.  WBC 4.7 hemoglobin 10.9 platelets 233.  Sodium 141 potassium 4.2 BUN 9 creatinine 0.88  05/14/2012.  Cholesterol 150 triglycerides 109 HDL 37 LDL 91.  03/07/2012.  Hemoglobin A1c 7.1.  Assessment and plan.   #1-peripheral vascular disease-this appears stable she is on Pletal as well as aspirin .  #2-anemia-this has been stable --Will recheck CBC.   #3 hypothyroidism this appears stable on Synthroid  --will update TSH .  #4-dementia-appears stable this is quite mild she is on Namenda as well as Aricept-she continues to function quite well in this setting .  Number 5-history of pulmonary embolism-this is remote there is been no recurrence she is on anticoagulation aspirin and Pletal l.  #6 status hypertension-is appears relatively stabl;lisinopril  was discontinued several months ago secondary to some low readings  #7-hyperlipidemia--this has been relatively stable-somewhat conservative here secondary to patient's advanced age--will update lipid panel  #8-diabetes type 2-this appearsrelatively Stable--on Glucophage.---hemoglobin A1c shows relative stability --will update  #9-edema-appears stable she is on low dose Lasix with potassium Will update metabolic panel-.   #10 -- history of neuropathy-is on Neurontin this appears to be stable.   #11-history of osteoporosis she is on calcium supplementation.   #12-history of GERD she is on PPI and this is stable as well   History of osteoarthritis-she is stable she does receive Tylenol for pain which appears to be effective      Her 14-depression-this is been addressed by psychiatric services she is on Lexapro this appears to be helping.  [id number]

## 2014-08-27 ENCOUNTER — Encounter (HOSPITAL_COMMUNITY)
Admission: AD | Admit: 2014-08-27 | Discharge: 2014-08-27 | Disposition: A | Discharge status: Home / Self Care | Payer: Medicare Other | Source: Skilled Nursing Facility | Attending: Internal Medicine | Admitting: Internal Medicine

## 2014-08-27 DIAGNOSIS — E039 Hypothyroidism, unspecified: Secondary | ICD-10-CM | POA: Insufficient documentation

## 2014-08-27 DIAGNOSIS — D649 Anemia, unspecified: Secondary | ICD-10-CM | POA: Insufficient documentation

## 2014-08-27 DIAGNOSIS — E119 Type 2 diabetes mellitus without complications: Secondary | ICD-10-CM | POA: Insufficient documentation

## 2014-08-27 DIAGNOSIS — E785 Hyperlipidemia, unspecified: Secondary | ICD-10-CM | POA: Diagnosis not present

## 2014-08-27 LAB — COMPREHENSIVE METABOLIC PANEL
ALBUMIN: 3.2 g/dL — AB (ref 3.5–5.0)
ALT: 8 U/L — AB (ref 14–54)
AST: 14 U/L — AB (ref 15–41)
Alkaline Phosphatase: 65 U/L (ref 38–126)
Anion gap: 10 (ref 5–15)
BUN: 12 mg/dL (ref 6–20)
CO2: 30 mmol/L (ref 22–32)
CREATININE: 1.03 mg/dL — AB (ref 0.44–1.00)
Calcium: 8.6 mg/dL — ABNORMAL LOW (ref 8.9–10.3)
Chloride: 101 mmol/L (ref 101–111)
GFR calc non Af Amer: 46 mL/min — ABNORMAL LOW (ref >60)
GFR, EST AFRICAN AMERICAN: 54 mL/min — AB (ref >60)
Glucose, Bld: 145 mg/dL — ABNORMAL HIGH (ref 65–99)
Potassium: 4 mmol/L (ref 3.5–5.1)
Sodium: 141 mmol/L (ref 135–145)
Total Bilirubin: 0.6 mg/dL (ref 0.3–1.2)
Total Protein: 7.1 g/dL (ref 6.5–8.1)

## 2014-08-27 LAB — LIPID PANEL
Cholesterol: 171 mg/dL (ref 0–200)
HDL: 37 mg/dL — ABNORMAL LOW (ref >40)
LDL Cholesterol: 116 mg/dL — ABNORMAL HIGH (ref 0–99)
Total CHOL/HDL Ratio: 4.6 RATIO
Triglycerides: 90 mg/dL (ref <150)
VLDL: 18 mg/dL (ref 0–40)

## 2014-08-27 LAB — CBC WITH DIFFERENTIAL/PLATELET
BASOS PCT: 0 % (ref 0–1)
Basophils Absolute: 0 10*3/uL (ref 0.0–0.1)
EOS ABS: 0.1 10*3/uL (ref 0.0–0.7)
Eosinophils Relative: 1 % (ref 0–5)
HCT: 38.9 % (ref 36.0–46.0)
HEMOGLOBIN: 11.4 g/dL — AB (ref 12.0–15.0)
Lymphocytes Relative: 21 % (ref 12–46)
Lymphs Abs: 1.7 10*3/uL (ref 0.7–4.0)
MCH: 24.3 pg — ABNORMAL LOW (ref 26.0–34.0)
MCHC: 29.3 g/dL — ABNORMAL LOW (ref 30.0–36.0)
MCV: 82.8 fL (ref 78.0–100.0)
Monocytes Absolute: 1.1 10*3/uL — ABNORMAL HIGH (ref 0.1–1.0)
Monocytes Relative: 14 % — ABNORMAL HIGH (ref 3–12)
NEUTROS PCT: 64 % (ref 43–77)
Neutro Abs: 5.1 10*3/uL (ref 1.7–7.7)
PLATELETS: 229 10*3/uL (ref 150–400)
RBC: 4.7 MIL/uL (ref 3.87–5.11)
RDW: 13.7 % (ref 11.5–15.5)
WBC: 8 10*3/uL (ref 4.0–10.5)

## 2014-08-27 LAB — TSH: TSH: 1.473 u[IU]/mL (ref 0.350–4.500)

## 2014-08-28 LAB — HEMOGLOBIN A1C
Hgb A1c MFr Bld: 7.3 % — ABNORMAL HIGH (ref 4.8–5.6)
MEAN PLASMA GLUCOSE: 163 mg/dL

## 2014-10-08 ENCOUNTER — Non-Acute Institutional Stay (SKILLED_NURSING_FACILITY): Payer: Medicare Other | Admitting: Internal Medicine

## 2014-10-08 DIAGNOSIS — I739 Peripheral vascular disease, unspecified: Secondary | ICD-10-CM

## 2014-10-08 DIAGNOSIS — I10 Essential (primary) hypertension: Secondary | ICD-10-CM

## 2014-10-08 DIAGNOSIS — E038 Other specified hypothyroidism: Secondary | ICD-10-CM | POA: Diagnosis not present

## 2014-10-08 DIAGNOSIS — E119 Type 2 diabetes mellitus without complications: Secondary | ICD-10-CM

## 2014-10-08 DIAGNOSIS — F0390 Unspecified dementia without behavioral disturbance: Secondary | ICD-10-CM | POA: Diagnosis not present

## 2014-10-08 DIAGNOSIS — R609 Edema, unspecified: Secondary | ICD-10-CM | POA: Diagnosis not present

## 2014-10-08 NOTE — Progress Notes (Signed)
Patient ID: Adella Nissen, female   DOB: March 13, 1922, 81 y.o.   MRN: [id number]       This is a routine visit.  Level of care skilled.  Facility [facility].   Chief complaint-medical management of anemia hyperlipidemia dementia diabetes neuropathy edema hypothyroidism peripheral vascular disease -depression .  History of present illness   Patient is a pleasant 81 year old female is been a long-term resident at this facility-she has been stable  There was concern for increased depressive symptoms flat affect earlier this year she was seen by psychiatric services and started on Lexapro this appears to have had a beneficial effect she is back more to her baseline somewhat boisterous interactive  She was also seen back in December for suspected bronchitis weakness some hypoxia this appears to have been transitory there is not really been any relapse.   -she did receive a course of antibiotics.   Her weight appears to be fairly stable at 232.6   does have a history of lower extremity edema--this appears relatively baseline .  She does have a history of pulmonary embolism t she is on aspirin .  She's also with a history of peripheral vascular disease.and is on Pletal  She also has a listed history of hypertension - She was on lisinopril but had some systolics in the 0000000 lisinopril was discontinued most recent blood pressure 161/61-139/71-140/71 this appears relatively baseline--I got 150/84 when I took it manually this afternoon s  .  Also a history of peripheral neuropathy that is stable on Neurontin.  She is a type II diabetic on Glucophage her blood sugars appear to be satisfactory in the low 100s-- hemoglobin A1c was is 7.3 on 08/27/2014  TSH was 1.473 she is on Synthroid with a listed history of hypothyroidism  .  Family medical social history has been reviewed per discharge summary on 10/28/2008  .  Medications have been reviewed per [facility]  Include A-aspirin 81 mg  daily-Lasix 20 mg a day-Lexapro 10 mg a day.  Calcium-.  Namenda 10 mg twice a day-Prilosec-plateau-potassium 10 mEq a day-Pravachol-vitamin D-do nebs when necessary-vitamin D-Synthroid 50 g daily-Glucophage 500 mg twice a day .  Review of systems.  General she is denying any fever or chills her weight appears to be relatively stable  .  Skin-denies issues.  Head ears eyes nose mouth and throat-does not complainof sore throat nasal discharge or visual changes.  Respiratory-no complaints of shortness of breath - Cardiac--no chest pain has some baseline lower extremity edema.  GI-denies any nausea vomiting diarrhea constipation or abdominal pain.  GU no complaints of dysuria.  Muscle skeletal-has complained of some leg pain in the past but does not complaining of that today the Neurontin appears to help Neurologic does not complain of any headache dizziness or syncopal-type episodes   Psych does have a history of dementia this appears to be mild-to-moderate she is doing well in this facility  .  Physical exam.   She is afebrile pulse of 80 respirations 18 blood pressure taken manually 150/84 weight is stable at 232.6   .  In general this is a well-nourished elderly female in no distress  .  Her skin is warm and dry.  Eyes pupils equal round react to light sclera and conjunctiva are clear Oropharynx is clear mucous membranes moist.  Chest -CTA- there is no labored breathing.  Heart regular rate and rhythm with a A999333 systolic murmur-she has baseline lower extremity edema obese changes--she has compression hose on bilaterally  Abdomen is soft nontender with positive bowel sounds it is obese.  Muscle skeletal moves all extremities at baseline ambulates at times with a walker--I do not note any deformity strength appears to be intact all 4 extremities  Neurologic-grossly intact speech is clear-- no lateralizing findings-grip strength is equal bilaterally shoulder  shrug is intact   Psych she is oriented to self place I would say her dementia is mild/moderate--has decent distant recall-we did talk today about her days as a teenager during the depression   .  Labs  08/27/2014.  Sodium 141 potassium 4 BUN 12 creatinine 1.03.  Albumen 3.2 AST 14 ALT 8 otherwise liver function tests within normal limits.  WBC 8.0 hemoglobin 11.4 platelets 229.  Cholesterol 171 triglycerides 90 HDL 37 LDL 116.  Abdomen A1c 7.3.  TSH-1.473  05/26/2014.  WBC 5.6 hemoglobin 10.9 platelets 228.  Sodium 141 potassium 4.1 and CO2 33 creatinine 0.89 BUN 10.  Albumen 3.1 total bilirubin 0.2 otherwise liver function tests within normal limits.  Hemoglobin A1c-6.7  04/02/2014.  Vitamin D 27.  03/14/2014.  WBC 7.8 hemoglobin 11.9 platelets 298.  Sodium 143 potassium 4.2 BUN 10 creatinine 1.14   01/30/2014.  Cholesterol 191-triglycerides 122-HDL 35-LDL 132.  [id number].  Hemoglobin A1c 7.5.  WBC 5.0 hemoglobin 11.5 platelets 240.  Sodium 145 potassium 4.8 BUN 10 creatinine 1.1.  Liver function tests within normal limits except albumin of 3.2   10/07/2013. [id number].  Hemoglobin A1c 7.3.  WBC 4.6 hemoglobin 10.9 platelets 220.  Sodium 144 potassium 4.4 BUN 9 creatinine 0.9-.  Liver function tests within normal limits except albumin of 3.1 07/22/2013.  Cholesterol 152 triglycerides 109 HDL 33 LDL 97.  06/12/2013.  Hemoglobin A1c 6.8.  Sodium 143 potassium 4.3 BUN 8 creatinine 0.82.  WBC 4.4 hemoglobin 10.9 platelets 228  03/25/2013.  [id number].  WBC 5.2 hemoglobin 11.3 platelets 225.  Sodium 142 potassium 3.9 BUN 8 creatinine 0.88.  Liver function tests within normal limits except albumin of 3.8.  12/12/2012.  Cholesterol 155-triglycerides 104-LDL 98-HDL 36-.  Hemoglobin A1c--7.1.  CBC 4.6 hemoglobin 11.3 platelets 215.  Sodium 143 potassium 4 BUN 11 creatinine 0.98.  Marland Kitchen  08/31/2012.  Hemoglobin A1c 6.6.   WBC 4.4 hemoglobin 11.7 platelets 218.  Sodium 144 potassium 4.2 BUN 9 creatinine 0.93.  Bilirubin 0.2-albumin 3.2 otherwise liver function tests within normal limits  .  08/01/2012.  [id number].  2- 24th 2014.  WBC 4.7 hemoglobin 10.9 platelets 233.  Sodium 141 potassium 4.2 BUN 9 creatinine 0.88  05/14/2012.  Cholesterol 150 triglycerides 109 HDL 37 LDL 91.  03/07/2012.  Hemoglobin A1c 7.1.  Assessment and plan.  #1-peripheral vascular disease-this appears stable she is on Pletal as well as aspirin .  #2-anemia-this has been stable --hemoglobin of 11.4 last month   #3 hypothyroidism this appears stable on Synthroid  --TSH-1.473 on 08/27/2014 .  #4-dementia-appears stable this is quite mild she is on Namenda as well as Aricept-she continues to function quite well in this setting .  Number 5-history of pulmonary embolism-this is remote there is been no recurrence she is on anticoagulation aspirin and Pletal l.  #6 status hypertension-;lisinopril  was discontinued several months ago secondary to some low readings--however her systolic is somewhat elevated-would be quite conservative here Will start low-dose fosinopril 2.5 mg a day-hopefully this will have some renal protective effects as well with her history of diabetes--will update a BMP in a couple weeks as well-- Monitor blood pressures daily with log for provider review next week  #  7-hyperlipidemia--this has been relatively stable-somewhat conservative here secondary to patient's advanced age--will update lipid panel  #8-diabetes type 2-this appearsrelatively Stable--on Glucophage.---hemoglobin A1c shows relative stability --  #9-edema-appears stable she is on low dose Lasix with potassium metabolic panel last month was relatively unremarkable   #10 -- history of neuropathy-is on Neurontin this appears to be stable.   #11-history of osteoporosis she is on calcium supplementation.   #12-history of  GERD she is on PPI and this is stable as well   History of osteoarthritis-she is stable she does receive Tylenol for pain which appears to be effective       14-depression-this is been addressed by psychiatric services she is on Lexapro this appears to be helping.     [id number]

## 2014-10-22 ENCOUNTER — Encounter (HOSPITAL_COMMUNITY)
Admission: AD | Admit: 2014-10-22 | Discharge: 2014-10-22 | Disposition: A | Discharge status: Home / Self Care | Payer: Medicare Other | Source: Skilled Nursing Facility | Attending: Internal Medicine | Admitting: Internal Medicine

## 2014-10-22 DIAGNOSIS — I10 Essential (primary) hypertension: Secondary | ICD-10-CM | POA: Insufficient documentation

## 2014-10-22 LAB — BASIC METABOLIC PANEL
Anion gap: 6 (ref 5–15)
BUN: 10 mg/dL (ref 6–20)
CO2: 33 mmol/L — ABNORMAL HIGH (ref 22–32)
Calcium: 8.6 mg/dL — ABNORMAL LOW (ref 8.9–10.3)
Chloride: 102 mmol/L (ref 101–111)
Creatinine, Ser: 0.91 mg/dL (ref 0.44–1.00)
GFR calc Af Amer: >60 mL/min (ref >60)
GFR calc non Af Amer: 54 mL/min — ABNORMAL LOW (ref >60)
Glucose, Bld: 133 mg/dL — ABNORMAL HIGH (ref 65–99)
Potassium: 4.2 mmol/L (ref 3.5–5.1)
Sodium: 141 mmol/L (ref 135–145)

## 2014-11-24 ENCOUNTER — Non-Acute Institutional Stay (SKILLED_NURSING_FACILITY): Payer: Medicare Other | Admitting: Internal Medicine

## 2014-11-24 DIAGNOSIS — K121 Other forms of stomatitis: Secondary | ICD-10-CM | POA: Diagnosis not present

## 2014-11-28 NOTE — Progress Notes (Signed)
Patient ID: Brandi Rice, female   DOB: 17-Oct-1921, 81 y.o.   MRN: [id number]                PROGRESS NOTE  DATE:  11/24/2014       FACILITY: [facility]                 LEVEL OF CARE:   SNF   Acute Visit            CHIEF COMPLAINT:  Oral lesion on her lower left jaw.      HISTORY OF PRESENT ILLNESS:  This is a patient who has dentures.  Over the last two weeks, she has noted a painful lesion on the lower left inner gum line.  She asked that I look at this today.  She has already been keeping her dentures out.      PHYSICAL EXAMINATION:   HEENT:   MOUTH/THROAT:  Indeed, on the lower aspect of her left gum anteriorly is a raised, painful nodule.  This has almost a crater-like appearance with surrounding erythema and a pale color surrounding this.     ASSESSMENT/PLAN:            Oral lesion, as described.  ?Denture stomatitis and/or pure denture trauma.  I have advised her to keep her dentures out and we will give her Nystatin swish and spit.  If this enlarges or gets worse with these measures, she will need to see an oral surgeon.

## 2015-01-16 ENCOUNTER — Non-Acute Institutional Stay (SKILLED_NURSING_FACILITY): Payer: Medicare Other | Admitting: Internal Medicine

## 2015-01-16 DIAGNOSIS — K1370 Unspecified lesions of oral mucosa: Secondary | ICD-10-CM

## 2015-01-16 DIAGNOSIS — I739 Peripheral vascular disease, unspecified: Secondary | ICD-10-CM | POA: Diagnosis not present

## 2015-01-16 DIAGNOSIS — E038 Other specified hypothyroidism: Secondary | ICD-10-CM | POA: Diagnosis not present

## 2015-01-16 DIAGNOSIS — I10 Essential (primary) hypertension: Secondary | ICD-10-CM | POA: Diagnosis not present

## 2015-01-16 NOTE — Progress Notes (Signed)
Patient ID: Brandi Rice, female   DOB: 1922/01/31, 81 y.o.   MRN: [id number]                  This is a routine  Acute   visit.  Level of care skilled.  Facility [facility].   Chief complaint-medical management of anemia hyperlipidemia dementia diabetes neuropathy edema hypothyroidism peripheral vascular disease -depression Acute visit secondary to recurring left lower gum lesion .  History of present illness   Patient is a pleasant 80 year old female is been a long-term resident at this facility-she has been stable  There was concern for increased depressive symptoms flat affect earlier this year she was seen by psychiatric services and started on Lexapro this appears to have had a beneficial effect she is back more to her baseline somewhat boisterous interactive  She was also seen back in December for suspected bronchitis weakness some hypoxia this appears to have been transitory there is not really been any relapse.   -she did receive a course of antibiotics.   does have a history of lower extremity edema--this appears relatively baseline .  She does have a history of pulmonary embolism t she is on aspirin .  She's also with a history of peripheral vascular disease.and is on Pletal  She also has a listed history of hypertension - She was on lisinopril but had some systolics in the 0000000 lisinopril was discontinued  -However last visitshe did have some elevated systolics and lisinopril very low-dose at 2.5 mg a day was started this appears to be stable recent blood pressures 130/70-123/63-106/67 she does not complain of any orthostasis or hypotensive symptoms s  .  Also a history of peripheral neuropathy that is stable on Neurontin.  She is a type II diabetic on Glucophage her blood sugars appear to be satisfactory in the low 100s-- hemoglobin A1c was is 7.3 on 08/27/2014  TSH was 1.473 she is on Synthroid with a listed history of hypothyroidism  Dr. Dellia Nims did see  her recently for a lesion on her lower left gum line this was treated topically with note that if it persists or worsens consider oral surgeon-tonight she is complaining of some pain here or she does have a somewhat recurrent lesion in that area that is tender to palpation I suspect this would warrant a look by surgery  .  Family medical social history has been reviewed per discharge summary on 10/28/2008  .  Medications have been reviewed per [facility]  Include A-aspirin 81 mg daily-Lasix 20 mg a day-Lexapro 10 mg a day.  Calcium-.  Namenda 10 mg twice a day-Prilosec-plateau-potassium 10 mEq a day-Pravachol-vitamin D-do nebs when necessary-vitamin D-Synthroid 50 g daily-Glucophage 500 mg twice a day--lisinopril 2.5 mg daily .  Review of systems.  General she is denying any fever or chills her weight appears to be relatively stable  .  Skin-denies issues.  Head ears eyes nose mouth and throat-does not complainof sore throat nasal discharge or visual changes Is complaining of some left lower gum pain as noted above.  Respiratory-no complaints of shortness of breath - Cardiac--no chest pain has some baseline lower extremity edema.  GI-denies any nausea vomiting diarrhea constipation or abdominal pain.  GU no complaints of dysuria.  Muscle skeletal-has complained of some leg pain in the past but does not complaining of that this evening the Neurontin appears to help Neurologic does not complain of any headache dizziness or syncopal-type episodes   Psych does have a history of  dementia this appears to be mild-to-moderate she is doing well in this facility  .  Physical exam.    Temperature 98.1 pulse 82 respirations 24 blood pressure 130/70   .  In general this is a well-nourished elderly female in no distress  .  Her skin is warm and dry.  Eyes pupils equal round react to light sclera and conjunctiva are clear Oropharynx is clear mucous membranes moist. On the lower left  gum line there is a small raised area that is tender to palpation apparently this is somewhat persistent-  Chest -CTA- there is no labored breathing.  Heart regular rate and rhythm with a [id number] systolic murmur-she has baseline lower extremity edema obese changes--she has compression hose on bilaterally  Abdomen is soft nontender with positive bowel sounds it is obese.  Muscle skeletal moves all extremities at baseline ambulates at times with a walker--I do not note any deformity strength appears to be intact all 4 extremities  Neurologic-grossly intact speech is clear-- no lateralizing findings-grip strength is equal bilaterally shoulder shrug is intact   Psych she is oriented to self place I would say her dementia is mild/moderate--   .  Labs  10/22/2014.  Sodium 141 potassium 4.2 BUN 10 creatinine 0.91  08/27/2014.  Sodium 141 potassium 4 BUN 12 creatinine 1.03.  Albumen 3.2 AST 14 ALT 8 otherwise liver function tests within normal limits.  WBC 8.0 hemoglobin 11.4 platelets 229.  Cholesterol 171 triglycerides 90 HDL 37 LDL 116.  Abdomen A1c 7.3.  TSH-1.473  05/26/2014.  WBC 5.6 hemoglobin 10.9 platelets 228.  Sodium 141 potassium 4.1 and CO2 33 creatinine 0.89 BUN 10.  Albumen 3.1 total bilirubin 0.2 otherwise liver function tests within normal limits.  Hemoglobin A1c-6.7  04/02/2014.  Vitamin D 27.  03/14/2014.  WBC 7.8 hemoglobin 11.9 platelets 298.  Sodium 143 potassium 4.2 BUN 10 creatinine 1.14   01/30/2014.  Cholesterol 191-triglycerides 122-HDL 35-LDL 132.  [id number].  Hemoglobin A1c 7.5.  WBC 5.0 hemoglobin 11.5 platelets 240.  Sodium 145 potassium 4.8 BUN 10 creatinine 1.1.  Liver function tests within normal limits except albumin of 3.2   10/07/2013. [id number].  Hemoglobin A1c 7.3.  WBC 4.6 hemoglobin 10.9 platelets 220.  Sodium 144 potassium 4.4 BUN 9 creatinine 0.9-.  Liver function tests within normal limits except  albumin of 3.1 07/22/2013.  Cholesterol 152 triglycerides 109 HDL 33 LDL 97.  06/12/2013.  Hemoglobin A1c 6.8.  Sodium 143 potassium 4.3 BUN 8 creatinine 0.82.  WBC 4.4 hemoglobin 10.9 platelets 228  03/25/2013.  [id number].  WBC 5.2 hemoglobin 11.3 platelets 225.  Sodium 142 potassium 3.9 BUN 8 creatinine 0.88.  Liver function tests within normal limits except albumin of 3.8.  12/12/2012.  Cholesterol 155-triglycerides 104-LDL 98-HDL 36-.  Hemoglobin A1c--7.1.  CBC 4.6 hemoglobin 11.3 platelets 215.  Sodium 143 potassium 4 BUN 11 creatinine 0.98.  Marland Kitchen  08/31/2012.  Hemoglobin A1c 6.6.  WBC 4.4 hemoglobin 11.7 platelets 218.  Sodium 144 potassium 4.2 BUN 9 creatinine 0.93.  Bilirubin 0.2-albumin 3.2 otherwise liver function tests within normal limits  .  08/01/2012.  [id number].  2- 24th 2014.  WBC 4.7 hemoglobin 10.9 platelets 233.  Sodium 141 potassium 4.2 BUN 9 creatinine 0.88  05/14/2012.  Cholesterol 150 triglycerides 109 HDL 37 LDL 91.  03/07/2012.  Hemoglobin A1c 7.1.  Assessment and plan.  #1-peripheral vascular disease-this appears stable she is on Pletal as well as aspirin .  #2-anemia-this has been stable --hemoglobin of 11.4 in September we'll update  this   #3 hypothyroidism this appears stable on Synthroid  --TSH-1.473 on 08/27/2014--will update this as well .  #4-dementia-appears stable this is quite mild she is on Namenda as well as Aricept-she continues to function quite well in this setting .  Number 5-history of pulmonary embolism-this is remote there is been no recurrence she is on anticoagulation aspirin and Pletal l.  #6 status hypertension-; Low-dose lisinopril has been restarted in appears to be tolerating this well recent blood pressures 130/70 106/67 123/63-she does not appear to be hypotensive this will have to be watched With history of diabetes lisinopril should have some renal protective  effects  #7-hyperlipidemia--this has been relatively stable-somewhat conservative here secondary to patient's advanced age--will update lipid panel   #8-diabetes type 2-this appearsrelatively Stable--on Glucophage.---hemoglobin A1c shows relative stability --will update this for current values  #9-edema-appears stable she is on low dose Lasix with potassium-Will update metabolic panel  [id number] -- history of neuropathy-is on Neurontin this appears to be stable.   #11-history of osteoporosis she is on calcium supplementation.   #12-history of GERD she is on PPI and this is stable as well  #13 History of osteoarthritis-she is stable she does receive Tylenol for pain which appears to be effective       14-depression-this is been addressed by psychiatric services she is on Lexapro this appears to be helping.  #15 history of persistent left gum lesion-since it is persisting will order for oral surgery consult-also will order some nystatin mouthwash 5 mL 4 times a day for 7 days to see if this will help of also encouraged her to keep her dentures out as much as possible again the chronicity of this causes  some concern  [id number]-of note greater than 35 minutes spent assessing patient reviewing her chart-and coordinating and formulating a plan of care for numerous diagnoses-of note greater than 50% of time spent coordinating plan of care     [phone]

## 2015-01-19 ENCOUNTER — Encounter (HOSPITAL_COMMUNITY)
Admission: RE | Admit: 2015-01-19 | Discharge: 2015-01-19 | Disposition: A | Discharge status: Home / Self Care | Payer: Medicare Other | Source: Skilled Nursing Facility | Attending: Internal Medicine | Admitting: Internal Medicine

## 2015-01-19 DIAGNOSIS — E119 Type 2 diabetes mellitus without complications: Secondary | ICD-10-CM | POA: Insufficient documentation

## 2015-01-19 LAB — COMPREHENSIVE METABOLIC PANEL
ALBUMIN: 3 g/dL — AB (ref 3.5–5.0)
ALK PHOS: 60 U/L (ref 38–126)
ALT: 10 U/L — ABNORMAL LOW (ref 14–54)
AST: 16 U/L (ref 15–41)
Anion gap: 4 — ABNORMAL LOW (ref 5–15)
BUN: 8 mg/dL (ref 6–20)
CALCIUM: 8.4 mg/dL — AB (ref 8.9–10.3)
CO2: 31 mmol/L (ref 22–32)
Chloride: 105 mmol/L (ref 101–111)
Creatinine, Ser: 0.9 mg/dL (ref 0.44–1.00)
GFR calc Af Amer: >60 mL/min (ref >60)
GFR calc non Af Amer: 54 mL/min — ABNORMAL LOW (ref >60)
Glucose, Bld: 129 mg/dL — ABNORMAL HIGH (ref 65–99)
POTASSIUM: 4.1 mmol/L (ref 3.5–5.1)
SODIUM: 140 mmol/L (ref 135–145)
Total Bilirubin: 0.4 mg/dL (ref 0.3–1.2)
Total Protein: 6.3 g/dL — ABNORMAL LOW (ref 6.5–8.1)

## 2015-01-19 LAB — CBC
HCT: 37.7 % (ref 36.0–46.0)
HEMOGLOBIN: 11.3 g/dL — AB (ref 12.0–15.0)
MCH: 24.5 pg — ABNORMAL LOW (ref 26.0–34.0)
MCHC: 30 g/dL (ref 30.0–36.0)
MCV: 81.6 fL (ref 78.0–100.0)
Platelets: 198 10*3/uL (ref 150–400)
RBC: 4.62 MIL/uL (ref 3.87–5.11)
RDW: 14.3 % (ref 11.5–15.5)
WBC: 4.9 10*3/uL (ref 4.0–10.5)

## 2015-01-19 LAB — TSH: TSH: 2.255 u[IU]/mL (ref 0.350–4.500)

## 2015-01-19 LAB — LIPID PANEL
Cholesterol: 161 mg/dL (ref 0–200)
HDL: 34 mg/dL — AB (ref >40)
LDL CALC: 110 mg/dL — AB (ref 0–99)
TRIGLYCERIDES: 87 mg/dL (ref <150)
Total CHOL/HDL Ratio: 4.7 RATIO
VLDL: 17 mg/dL (ref 0–40)

## 2015-01-20 LAB — HEMOGLOBIN A1C
Hgb A1c MFr Bld: 7.7 % — ABNORMAL HIGH (ref 4.8–5.6)
Mean Plasma Glucose: 174 mg/dL

## 2015-01-25 ENCOUNTER — Encounter: Payer: Self-pay | Admitting: Internal Medicine

## 2015-01-25 DIAGNOSIS — K1370 Unspecified lesions of oral mucosa: Secondary | ICD-10-CM | POA: Insufficient documentation

## 2015-06-14 ENCOUNTER — Non-Acute Institutional Stay (SKILLED_NURSING_FACILITY): Payer: Medicare Other | Admitting: Internal Medicine

## 2015-06-14 ENCOUNTER — Encounter (HOSPITAL_COMMUNITY)
Admission: RE | Admit: 2015-06-14 | Discharge: 2015-06-14 | Disposition: A | Discharge status: Home / Self Care | Payer: Medicare Other | Source: Skilled Nursing Facility | Attending: Internal Medicine | Admitting: Internal Medicine

## 2015-06-14 ENCOUNTER — Other Ambulatory Visit (HOSPITAL_COMMUNITY)
Admission: AD | Admit: 2015-06-14 | Discharge: 2015-06-14 | Disposition: A | Discharge status: Home / Self Care | Payer: Medicare Other | Source: Skilled Nursing Facility | Attending: Internal Medicine | Admitting: Internal Medicine

## 2015-06-14 DIAGNOSIS — N390 Urinary tract infection, site not specified: Secondary | ICD-10-CM | POA: Insufficient documentation

## 2015-06-14 DIAGNOSIS — M810 Age-related osteoporosis without current pathological fracture: Secondary | ICD-10-CM | POA: Diagnosis not present

## 2015-06-14 DIAGNOSIS — I739 Peripheral vascular disease, unspecified: Secondary | ICD-10-CM | POA: Insufficient documentation

## 2015-06-14 DIAGNOSIS — E1121 Type 2 diabetes mellitus with diabetic nephropathy: Secondary | ICD-10-CM

## 2015-06-14 DIAGNOSIS — K219 Gastro-esophageal reflux disease without esophagitis: Secondary | ICD-10-CM | POA: Diagnosis not present

## 2015-06-14 DIAGNOSIS — M1990 Unspecified osteoarthritis, unspecified site: Secondary | ICD-10-CM | POA: Diagnosis not present

## 2015-06-14 DIAGNOSIS — R410 Disorientation, unspecified: Secondary | ICD-10-CM

## 2015-06-14 DIAGNOSIS — I10 Essential (primary) hypertension: Secondary | ICD-10-CM | POA: Diagnosis not present

## 2015-06-14 DIAGNOSIS — E039 Hypothyroidism, unspecified: Secondary | ICD-10-CM | POA: Insufficient documentation

## 2015-06-14 DIAGNOSIS — N10 Acute tubulo-interstitial nephritis: Secondary | ICD-10-CM | POA: Diagnosis not present

## 2015-06-14 LAB — CBC WITH DIFFERENTIAL/PLATELET
BASOS ABS: 0 10*3/uL (ref 0.0–0.1)
Basophils Relative: 0 %
Eosinophils Absolute: 0.2 10*3/uL (ref 0.0–0.7)
Eosinophils Relative: 2 %
HEMATOCRIT: 41.6 % (ref 36.0–46.0)
Hemoglobin: 12.9 g/dL (ref 12.0–15.0)
LYMPHS PCT: 20 %
Lymphs Abs: 1.7 10*3/uL (ref 0.7–4.0)
MCH: 25 pg — ABNORMAL LOW (ref 26.0–34.0)
MCHC: 31 g/dL (ref 30.0–36.0)
MCV: 80.5 fL (ref 78.0–100.0)
Monocytes Absolute: 1.2 10*3/uL — ABNORMAL HIGH (ref 0.1–1.0)
Monocytes Relative: 14 %
NEUTROS ABS: 5.6 10*3/uL (ref 1.7–7.7)
NEUTROS PCT: 64 %
PLATELETS: 257 10*3/uL (ref 150–400)
RBC: 5.17 MIL/uL — AB (ref 3.87–5.11)
RDW: 13.9 % (ref 11.5–15.5)
WBC: 8.7 10*3/uL (ref 4.0–10.5)

## 2015-06-14 LAB — URINE MICROSCOPIC-ADD ON

## 2015-06-14 LAB — COMPREHENSIVE METABOLIC PANEL
ALBUMIN: 3.3 g/dL — AB (ref 3.5–5.0)
ALT: 16 U/L (ref 14–54)
ANION GAP: 12 (ref 5–15)
AST: 29 U/L (ref 15–41)
Alkaline Phosphatase: 70 U/L (ref 38–126)
BILIRUBIN TOTAL: 0.7 mg/dL (ref 0.3–1.2)
BUN: 22 mg/dL — ABNORMAL HIGH (ref 6–20)
CO2: 26 mmol/L (ref 22–32)
Calcium: 9 mg/dL (ref 8.9–10.3)
Chloride: 102 mmol/L (ref 101–111)
Creatinine, Ser: 1.38 mg/dL — ABNORMAL HIGH (ref 0.44–1.00)
GFR calc Af Amer: 37 mL/min — ABNORMAL LOW (ref >60)
GFR, EST NON AFRICAN AMERICAN: 32 mL/min — AB (ref >60)
Glucose, Bld: 100 mg/dL — ABNORMAL HIGH (ref 65–99)
POTASSIUM: 4.6 mmol/L (ref 3.5–5.1)
Sodium: 140 mmol/L (ref 135–145)
TOTAL PROTEIN: 7.8 g/dL (ref 6.5–8.1)

## 2015-06-14 LAB — URINALYSIS, ROUTINE W REFLEX MICROSCOPIC
GLUCOSE, UA: NEGATIVE mg/dL
Nitrite: NEGATIVE
PROTEIN: NEGATIVE mg/dL
SPECIFIC GRAVITY, URINE: 1.02 (ref 1.005–1.030)
pH: 5 (ref 5.0–8.0)

## 2015-06-14 NOTE — Progress Notes (Signed)
Patient ID: Brandi Rice, female   DOB: 07-31-1921, 82 y.o.   MRN: [id number]    Facility; [facility] Chief complaint; altered mental status, speech changes question dysarthric History; this is a 82 year old patient who is a long-term resident at this facility. She has some degree of dementia with depression. She is a type II diabetic on Glucophage. She normally sits in the hallway converses with other residents are staff . She is able to feed herself. She is nonambulatory.  Apparently one of the patient's relatives found her sitting naked next to her bed. It is a big change in behavior for her. She rang for this staff the staff noted her to be talking nonsensically. She was put back to bed and her speech seemed to clear. Her blood she was checked at 168.  BMP Latest Ref Rng 01/19/2015 10/22/2014 08/27/2014  Glucose 65 - 99 mg/dL 129(H) 133(H) 145(H)  BUN 6 - 20 mg/dL 8 10 12   Creatinine 0.44 - 1.00 mg/dL 0.90 0.91 1.03(H)  Sodium 135 - 145 mmol/L 140 141 141  Potassium 3.5 - 5.1 mmol/L 4.1 4.2 4.0  Chloride 101 - 111 mmol/L 105 102 101  CO2 22 - 32 mmol/L 31 33(H) 30  Calcium 8.9 - 10.3 mg/dL 8.4(L) 8.6(L) 8.6(L)   CBC Latest Ref Rng 01/19/2015 08/27/2014 05/26/2014  WBC 4.0 - 10.5 K/uL 4.9 8.0 5.6  Hemoglobin 12.0 - 15.0 g/dL 11.3(L) 11.4(L) 10.9(L)  Hematocrit 36.0 - 46.0 % 37.7 38.9 37.1  Platelets 150 - 400 K/uL 198 229 228    Past Medical History  Diagnosis Date  . PVD (peripheral vascular disease) ([place])   . History of supraventricular tachycardia   . Obesity   . Hyperlipidemia   . Hypertension   . Long term (current) use of anticoagulants     coumadin  . DM (diabetes mellitus) ([place])   . Dermatomycosis, unspecified   . Acute cystitis   . Other malaise and fatigue   . GERD (gastroesophageal reflux disease)   . Hypothyroidism   . DJD (degenerative joint disease)     Spine  . Dementia   . History of pulmonary embolism     Medications Aricept 10 daily ASA 81 daily Calcium  500+ D one by mouth twice a day Lasix 20 daily Synthroid 50 g daily Lexapro 10 daily Lisinopril 2.5 daily Namenda 10 mg twice a day Metformin 500 twice a day Neurontin 300 twice a day Omeprazole 20 daily Pletal 50 mg twice a day Pravastatin 80 daily  Review of systems; not felt to be reliable  Physical examination; temperature is 97.5, pulse 84, respirations 18, blood pressure 93/52 Gen. patient does not look to be her usual self although her speech currently is comprehensible. Respiratory clear entry bilaterally Cardiac heart sounds are normal no murmurs Abdomen; no liver no spleen no masses. Bowel sounds are positive. She appears to be retching the vomit. GU there is suprapubic and severe left CVA tenderness.  Neurologic; she seems to move both arms to command. There is no pronator drift able to move both legs reflexes are symmetrically reduced toes downgoing. Mental status; she told me the "devil came in my room, I didn't want him in here"  Impression/plan #1 altered mental status; I suspect this is incipient delirium. She seems to be fluctuating quite a bit, a TIA is difficult to exclude but in this setting in an elderly patient with presumed pyelonephritis I think this is delirium. #2 probable pyelonephritis. She has a history of  this and marked CVA tenderness on the left. #3 hypotension. I'm going to give her some IV fluids.  We are going to get a urine for culture, comprehensive metabolic panel, CBC with differential. I'm going to give her some normal saline and parenteral Rocephin. The patient is still listed as a full code to see if she responds to this.

## 2015-06-16 ENCOUNTER — Non-Acute Institutional Stay (SKILLED_NURSING_FACILITY): Payer: Medicare Other | Admitting: Internal Medicine

## 2015-06-16 ENCOUNTER — Ambulatory Visit (HOSPITAL_COMMUNITY)
Admission: AD | Admit: 2015-06-16 | Discharge: 2015-06-16 | Disposition: A | Discharge status: Home / Self Care | Payer: Medicare Other | Source: Skilled Nursing Facility | Attending: Internal Medicine | Admitting: Internal Medicine

## 2015-06-16 ENCOUNTER — Other Ambulatory Visit (HOSPITAL_COMMUNITY)
Admission: AD | Admit: 2015-06-16 | Discharge: 2015-06-16 | Disposition: A | Discharge status: Home / Self Care | Payer: Medicare Other | Source: Skilled Nursing Facility | Attending: Internal Medicine | Admitting: Internal Medicine

## 2015-06-16 DIAGNOSIS — I10 Essential (primary) hypertension: Secondary | ICD-10-CM | POA: Insufficient documentation

## 2015-06-16 DIAGNOSIS — R4182 Altered mental status, unspecified: Secondary | ICD-10-CM | POA: Diagnosis not present

## 2015-06-16 DIAGNOSIS — E119 Type 2 diabetes mellitus without complications: Secondary | ICD-10-CM | POA: Diagnosis present

## 2015-06-16 DIAGNOSIS — R062 Wheezing: Secondary | ICD-10-CM | POA: Insufficient documentation

## 2015-06-16 DIAGNOSIS — N390 Urinary tract infection, site not specified: Secondary | ICD-10-CM

## 2015-06-16 DIAGNOSIS — R0902 Hypoxemia: Secondary | ICD-10-CM

## 2015-06-16 LAB — COMPREHENSIVE METABOLIC PANEL
ALBUMIN: 2.8 g/dL — AB (ref 3.5–5.0)
ALK PHOS: 65 U/L (ref 38–126)
ALT: 17 U/L (ref 14–54)
AST: 29 U/L (ref 15–41)
Anion gap: 8 (ref 5–15)
BUN: 19 mg/dL (ref 6–20)
CALCIUM: 8.3 mg/dL — AB (ref 8.9–10.3)
CHLORIDE: 101 mmol/L (ref 101–111)
CO2: 29 mmol/L (ref 22–32)
CREATININE: 0.99 mg/dL (ref 0.44–1.00)
GFR calc Af Amer: 56 mL/min — ABNORMAL LOW (ref >60)
GFR calc non Af Amer: 48 mL/min — ABNORMAL LOW (ref >60)
GLUCOSE: 107 mg/dL — AB (ref 65–99)
Potassium: 4.1 mmol/L (ref 3.5–5.1)
SODIUM: 138 mmol/L (ref 135–145)
Total Bilirubin: 0.7 mg/dL (ref 0.3–1.2)
Total Protein: 7.1 g/dL (ref 6.5–8.1)

## 2015-06-16 LAB — CBC AND DIFFERENTIAL: WBC: 7 10^3/mL

## 2015-06-16 LAB — CBC WITH DIFFERENTIAL/PLATELET
BASOS ABS: 0 10*3/uL (ref 0.0–0.1)
BASOS PCT: 0 %
EOS ABS: 0.2 10*3/uL (ref 0.0–0.7)
Eosinophils Relative: 2 %
HCT: 38.1 % (ref 36.0–46.0)
HEMOGLOBIN: 11.8 g/dL — AB (ref 12.0–15.0)
Lymphocytes Relative: 21 %
Lymphs Abs: 1.5 10*3/uL (ref 0.7–4.0)
MCH: 24.8 pg — ABNORMAL LOW (ref 26.0–34.0)
MCHC: 31 g/dL (ref 30.0–36.0)
MCV: 80.2 fL (ref 78.0–100.0)
Monocytes Absolute: 1.1 10*3/uL — ABNORMAL HIGH (ref 0.1–1.0)
Monocytes Relative: 15 %
NEUTROS PCT: 62 %
Neutro Abs: 4.3 10*3/uL (ref 1.7–7.7)
Platelets: 281 10*3/uL (ref 150–400)
RBC: 4.75 MIL/uL (ref 3.87–5.11)
RDW: 13.9 % (ref 11.5–15.5)
WBC: 7 10*3/uL (ref 4.0–10.5)

## 2015-06-16 LAB — BASIC METABOLIC PANEL
BUN: 19 mg/dL (ref 4–21)
CREATININE: 1 mg/dL (ref 0.5–1.1)
Glucose: 107 mg/dL
SODIUM: 138 mmol/L (ref 137–147)

## 2015-06-16 LAB — HEPATIC FUNCTION PANEL: Bilirubin, Total: 0.7 mg/dL

## 2015-06-16 NOTE — Progress Notes (Signed)
Patient ID: Brandi Rice, female   DOB: 02/05/1922, 82 y.o.   MRN: [id number]                   This is an  Acute   visit.  Level of care skilled.  Facility [facility]   Chief complaint- Acute visit follow-up altered mental status-UTI- .  History of present illness   Patient is a pleasant 82 year old female is been a long-term resident at this facility--she has a history of dementia diabetes type 2 previous history of pulmonary embolism she is on aspirin as well as clinical with a history of peripheral vascular disease as well.  Dr. Dellia Nims saw her over the weekend for altered mental status and some CV tenderness--apparently she was somewhat hypotensive as well.  He did give her IV fluids and ordered lab work which actually came back fairly unremarkable creatinine up a bit from baseline at 1.38.  She was also started on Rocephin for presumed UTI and urine culture has grown out Prote[facility].  When I initially saw her today she appeared to be back at her baseline resting in bed comfortably vital signs stable blood pressure stable 120/68.  However later in the day apparently patient had a change in status she was slightly hypoxic with O2 sats in the mid 80s this quickly rose up to the 90s with oxygen-she was slightly diaphoretic apparently for short episode which quickly resolved.  I did reevaluate her physical exam was essentially unchanged except for some slight congestion noted at her lung bases she was also seen by Dr. Linna Darner who was in the facility-there was no sign of distress she was alert talking appeared to be in her typical frame of mind  .   Marland Kitchen  Family medical social history has been reviewed per discharge summary on 10/28/2008  .  Medications have been reviewed per [facility]  Include A-aspirin 81 mg daily-Lasix 20 mg a day-Lexapro 10 mg a day.  Calcium-.  Namenda 10 mg twice a day-Prilosec-Pletal 50mg -potassium 10 mEq a day-Pravachol-vitamin D-do nebs when  necessary-vitamin D-Synthroid 50 g daily-Glucophage 500 mg twice a day--lisinopril 2.5 mg daily .  Review of systems.  General she is denying any fever or chills  Skin-on reevaluation again she was slightly diaphoretic but this appeared to quickly resolved Head ears eyes nose mouth and throat-does not complainof sore throat nasal discharge or visual changes  Respiratory-no complaints of  Acute shortness of breath -no cough Cardiac--no chest pain has some baseline lower extremity edema.  GI-denies any nausea vomiting diarrhea constipation or abdominal pain.  GU no complaints of dysuria.  Muscle skeletal-has complained of some leg pain in the past but does not complaining of that this evening  Neurologic does not complain of any headache dizziness or syncopal-type episodes   Psych does have a history of dementia this appears to be mild-to-moderate .  Physical exam.    She continues to be afebrile respirations 16 blood pressure continues to be stable-128/70 O2 saturation originally 85% on reevaluation rose up into the low 90s on 2 L oxygen  .  In general this is a well-nourished elderly female in no distress  --appears a bit weaker than when I saw her earlier today Her skin is warm and dry. --Diaphoresis appears resolved Eyes pupils equal round react to light sclera and conjunctiva are clear Oropharynx is clear mucous membranes moist.   Chest -no labored breathing slight rales at bases Heart regular rate and rhythm with a A999333 systolic  murmur-she has baseline lower extremity edema obese changes--she has compression hose on bilaterally  Abdomen is soft nontender with positive bowel sounds it is obese.  Muscle skeletal moves all extremities at baseline ambulates at times with a walker--I do not note any deformity strength appears to be intact all 4 extremities  Neurologic-grossly intact speech is clear-- no lateralizing findings-grip strength is equal bilaterally  Psych  she is oriented to self place I would say her dementia is mild/moderate--   .  Labs  06/14/2015.  Sodium 140 potassium 4.6 BUN 22 creatinine 1.38 CO2 26.  Albumin 3.3 otherwise liver function tests within normal limits.  WBC 8.7 hemoglobin 12.9 platelets 257.      10/22/2014.  Sodium 141 potassium 4.2 BUN 10 creatinine 0.91  08/27/2014.  Sodium 141 potassium 4 BUN 12 creatinine 1.03.  Albumen 3.2 AST 14 ALT 8 otherwise liver function tests within normal limits.  WBC 8.0 hemoglobin 11.4 platelets 229.  Cholesterol 171 triglycerides 90 HDL 37 LDL 116.  Abdomen A1c 7.3.  TSH-1.473  05/26/2014.  WBC 5.6 hemoglobin 10.9 platelets 228.  Sodium 141 potassium 4.1 and CO2 33 creatinine 0.89 BUN 10.  Albumen 3.1 total bilirubin 0.2 otherwise liver function tests within normal limits.  Hemoglobin A1c-6.7  04/02/2014.  Vitamin D 27.  03/14/2014.  WBC 7.8 hemoglobin 11.9 platelets 298.  Sodium 143 potassium 4.2 BUN 10 creatinine 1.14   01/30/2014.  Cholesterol 191-triglycerides 122-HDL 35-LDL 132.  [id number].  Hemoglobin A1c 7.5.  WBC 5.0 hemoglobin 11.5 platelets 240.  Sodium 145 potassium 4.8 BUN 10 creatinine 1.1.  Liver function tests within normal limits except albumin of 3.2   10/07/2013. [id number].  Hemoglobin A1c 7.3.  WBC 4.6 hemoglobin 10.9 platelets 220.  Sodium 144 potassium 4.4 BUN 9 creatinine 0.9-.  Liver function tests within normal limits except albumin of 3.1 07/22/2013.  Cholesterol 152 triglycerides 109 HDL 33 LDL 97.  06/12/2013.  Hemoglobin A1c 6.8.  Sodium 143 potassium 4.3 BUN 8 creatinine 0.82.  WBC 4.4 hemoglobin 10.9 platelets 228  03/25/2013.  [id number].  WBC 5.2 hemoglobin 11.3 platelets 225.  Sodium 142 potassium 3.9 BUN 8 creatinine 0.88.  Liver function tests within normal limits except albumin of 3.8.  12/12/2012.  Cholesterol 155-triglycerides 104-LDL 98-HDL 36-.  Hemoglobin A1c--7.1.   CBC 4.6 hemoglobin 11.3 platelets 215.  Sodium 143 potassium 4 BUN 11 creatinine 0.98.  Marland Kitchen  08/31/2012.  Hemoglobin A1c 6.6.  WBC 4.4 hemoglobin 11.7 platelets 218.  Sodium 144 potassium 4.2 BUN 9 creatinine 0.93.  Bilirubin 0.2-albumin 3.2 otherwise liver function tests within normal limits  .  08/01/2012.  [id number].  2- 24th 2014.  WBC 4.7 hemoglobin 10.9 platelets 233.  Sodium 141 potassium 4.2 BUN 9 creatinine 0.88  05/14/2012.  Cholesterol 150 triglycerides 109 HDL 37 LDL 91.  03/07/2012.  Hemoglobin A1c 7.1.  Assessment and plan.   History of altered mental status --UTI- hypoxia-somewhat challenging situation patient was seen with Dr. Linna Darner today when she had hypoxia-currently in no distress vital signs appear to be stable-will do stat blood work including a CBC and CMP-also a two-view chest x-ray monitor closely vital signs pulse ox every 4 hours for now-also will order DuoNeb nebs every 6 hours routine for 72 hours and then when necessary. Area  For UTI she does continue on Rocephin well weight antibiotic sensitivities she has been afebrile does not really complain of dysuria today.  Of note we have obtained the up later lab work which appears relatively stable white count  of 7.0 hemoglobin 11.8 platelets 281.  Sodium 138 potassium 4.1 BUN 19 creatinine 0.99.  Again patient will have to be monitored closely any change in status I suspect she may have to go to the ER  [id number]-of note greater than 35 minutes spent assessing patient-reassessing patient-and coordinating and formulating a plan a care-of note greater than 50% of time spent: Coordinating plan of care with input from Dr. Linna Darner who also saw the patient     .

## 2015-06-17 ENCOUNTER — Encounter (HOSPITAL_COMMUNITY)
Admission: AD | Admit: 2015-06-17 | Discharge: 2015-06-17 | Disposition: A | Discharge status: Home / Self Care | Payer: Medicare Other | Source: Skilled Nursing Facility | Attending: Internal Medicine | Admitting: Internal Medicine

## 2015-06-17 DIAGNOSIS — N390 Urinary tract infection, site not specified: Secondary | ICD-10-CM | POA: Diagnosis not present

## 2015-06-17 LAB — URINE CULTURE: Culture: >=100000

## 2015-06-17 LAB — BASIC METABOLIC PANEL
ANION GAP: 8 (ref 5–15)
BUN: 16 mg/dL (ref 6–20)
CALCIUM: 8.3 mg/dL — AB (ref 8.9–10.3)
CO2: 29 mmol/L (ref 22–32)
Chloride: 102 mmol/L (ref 101–111)
Creatinine, Ser: 0.95 mg/dL (ref 0.44–1.00)
GFR calc non Af Amer: 51 mL/min — ABNORMAL LOW (ref >60)
GFR, EST AFRICAN AMERICAN: 59 mL/min — AB (ref >60)
Glucose, Bld: 126 mg/dL — ABNORMAL HIGH (ref 65–99)
POTASSIUM: 4.2 mmol/L (ref 3.5–5.1)
Sodium: 139 mmol/L (ref 135–145)

## 2015-06-18 ENCOUNTER — Emergency Department (HOSPITAL_COMMUNITY)
Admission: EM | Admit: 2015-06-18 | Discharge: 2015-06-18 | Disposition: A | Discharge status: Home / Self Care | Payer: Medicare Other | Attending: Emergency Medicine | Admitting: Emergency Medicine

## 2015-06-18 ENCOUNTER — Emergency Department (HOSPITAL_COMMUNITY): Payer: Medicare Other

## 2015-06-18 ENCOUNTER — Encounter: Payer: Self-pay | Admitting: Internal Medicine

## 2015-06-18 ENCOUNTER — Non-Acute Institutional Stay (SKILLED_NURSING_FACILITY): Payer: Medicare Other | Admitting: Internal Medicine

## 2015-06-18 ENCOUNTER — Encounter (HOSPITAL_COMMUNITY): Payer: Self-pay | Admitting: Emergency Medicine

## 2015-06-18 DIAGNOSIS — R4182 Altered mental status, unspecified: Secondary | ICD-10-CM | POA: Diagnosis not present

## 2015-06-18 DIAGNOSIS — Z7984 Long term (current) use of oral hypoglycemic drugs: Secondary | ICD-10-CM | POA: Insufficient documentation

## 2015-06-18 DIAGNOSIS — R531 Weakness: Secondary | ICD-10-CM | POA: Diagnosis present

## 2015-06-18 DIAGNOSIS — E669 Obesity, unspecified: Secondary | ICD-10-CM | POA: Diagnosis not present

## 2015-06-18 DIAGNOSIS — E119 Type 2 diabetes mellitus without complications: Secondary | ICD-10-CM | POA: Insufficient documentation

## 2015-06-18 DIAGNOSIS — R7981 Abnormal blood-gas level: Secondary | ICD-10-CM | POA: Diagnosis not present

## 2015-06-18 DIAGNOSIS — Z86718 Personal history of other venous thrombosis and embolism: Secondary | ICD-10-CM | POA: Diagnosis not present

## 2015-06-18 DIAGNOSIS — Z79899 Other long term (current) drug therapy: Secondary | ICD-10-CM | POA: Insufficient documentation

## 2015-06-18 DIAGNOSIS — E785 Hyperlipidemia, unspecified: Secondary | ICD-10-CM | POA: Diagnosis not present

## 2015-06-18 DIAGNOSIS — Z7982 Long term (current) use of aspirin: Secondary | ICD-10-CM | POA: Diagnosis not present

## 2015-06-18 DIAGNOSIS — I10 Essential (primary) hypertension: Secondary | ICD-10-CM | POA: Diagnosis not present

## 2015-06-18 DIAGNOSIS — E039 Hypothyroidism, unspecified: Secondary | ICD-10-CM | POA: Diagnosis not present

## 2015-06-18 LAB — COMPREHENSIVE METABOLIC PANEL
ALBUMIN: 3.2 g/dL — AB (ref 3.5–5.0)
ALK PHOS: 73 U/L (ref 38–126)
ALT: 20 U/L (ref 14–54)
ANION GAP: 10 (ref 5–15)
AST: 34 U/L (ref 15–41)
BUN: 15 mg/dL (ref 6–20)
CO2: 24 mmol/L (ref 22–32)
Calcium: 8.4 mg/dL — ABNORMAL LOW (ref 8.9–10.3)
Chloride: 99 mmol/L — ABNORMAL LOW (ref 101–111)
Creatinine, Ser: 1.08 mg/dL — ABNORMAL HIGH (ref 0.44–1.00)
GFR calc non Af Amer: 44 mL/min — ABNORMAL LOW (ref >60)
GFR, EST AFRICAN AMERICAN: 50 mL/min — AB (ref >60)
GLUCOSE: 111 mg/dL — AB (ref 65–99)
Potassium: 4.2 mmol/L (ref 3.5–5.1)
SODIUM: 133 mmol/L — AB (ref 135–145)
Total Bilirubin: 0.5 mg/dL (ref 0.3–1.2)
Total Protein: 7.5 g/dL (ref 6.5–8.1)

## 2015-06-18 LAB — CBC WITH DIFFERENTIAL/PLATELET
BASOS PCT: 0 %
Basophils Absolute: 0 10*3/uL (ref 0.0–0.1)
EOS ABS: 0.2 10*3/uL (ref 0.0–0.7)
EOS PCT: 3 %
HCT: 41.8 % (ref 36.0–46.0)
HEMOGLOBIN: 12.5 g/dL (ref 12.0–15.0)
LYMPHS ABS: 1.3 10*3/uL (ref 0.7–4.0)
Lymphocytes Relative: 17 %
MCH: 24.4 pg — AB (ref 26.0–34.0)
MCHC: 29.9 g/dL — AB (ref 30.0–36.0)
MCV: 81.6 fL (ref 78.0–100.0)
MONOS PCT: 11 %
Monocytes Absolute: 0.8 10*3/uL (ref 0.1–1.0)
NEUTROS PCT: 69 %
Neutro Abs: 5 10*3/uL (ref 1.7–7.7)
PLATELETS: 256 10*3/uL (ref 150–400)
RBC: 5.12 MIL/uL — AB (ref 3.87–5.11)
RDW: 13.7 % (ref 11.5–15.5)
WBC: 7.3 10*3/uL (ref 4.0–10.5)

## 2015-06-18 NOTE — Discharge Instructions (Signed)
Follow up with your md as needed °

## 2015-06-18 NOTE — ED Provider Notes (Signed)
CSN: [id number]     Arrival date & time 06/18/15  1534 History   First MD Initiated Contact with Patient 06/18/15 1545     Chief Complaint  Patient presents with  . Weakness     (Consider location/radiation/quality/duration/timing/severity/associated sxs/prior Treatment) Patient is a 82 y.o. female presenting with weakness. The history is provided by the patient (Patient states that she was a little weak today. When she was at the nursing home they got a low oxygen level on her but here in emergency department she was 100% on 2 L. Patient normally on 2 L).  Weakness This is a new problem. The current episode started 1 to 2 hours ago. The problem occurs rarely. The problem has been resolved. Pertinent negatives include no chest pain, no abdominal pain and no headaches. Nothing aggravates the symptoms. Nothing relieves the symptoms.    Past Medical History  Diagnosis Date  . PVD (peripheral vascular disease) ([place])   . History of supraventricular tachycardia   . Obesity   . Hyperlipidemia   . Hypertension   . Long term (current) use of anticoagulants     coumadin  . DM (diabetes mellitus) ([place])   . Dermatomycosis, unspecified   . Acute cystitis   . Other malaise and fatigue   . GERD (gastroesophageal reflux disease)   . Hypothyroidism   . DJD (degenerative joint disease)     Spine  . Dementia   . History of pulmonary embolism    Past Surgical History  Procedure Laterality Date  . Mastectomy  2003    Left  . Thyroidectomy, partial  1962  . Atrial ablation surgery  01/2003    Catheter, to tachic palpitations   Family History  Problem Relation Age of Onset  . Coronary artery disease Sister   . Coronary artery disease Sister   . Coronary artery disease Sister   . Heart attack Sister   . Alcohol abuse Sister    Social History  Substance Use Topics  . Smoking status: Never Smoker   . Smokeless tobacco: None  . Alcohol Use: No   OB History    No data available      Review of Systems  Constitutional: Negative for appetite change and fatigue.  HENT: Negative for congestion, ear discharge and sinus pressure.   Eyes: Negative for discharge.  Respiratory: Negative for cough.   Cardiovascular: Negative for chest pain.  Gastrointestinal: Negative for abdominal pain and diarrhea.  Genitourinary: Negative for frequency and hematuria.  Musculoskeletal: Negative for back pain.  Skin: Negative for rash.  Neurological: Positive for weakness. Negative for seizures and headaches.  Psychiatric/Behavioral: Negative for hallucinations.      Allergies  Contrast media and Latex  Home Medications   Prior to Admission medications   Medication Sig Start Date End Date Taking? Authorizing Provider  acetaminophen (TYLENOL) 325 MG tablet Take 650 mg by mouth every 12 (twelve) hours. Take two tablets by mouth every 4 hours as needed for pain. Do not exceed APAP 3000mg /day from all sources.   Yes Historical Provider, MD  albuterol (PROVENTIL) (2.5 MG/3ML) 0.083% nebulizer solution Take 2.5 mg by nebulization every 6 (six) hours as needed for wheezing or shortness of breath.   Yes Historical Provider, MD  aspirin 81 MG tablet Take 81 mg by mouth daily.    Yes Historical Provider, MD  calcium-vitamin D (OSCAL WITH D) 500-200 MG-UNIT per tablet Take 1 tablet by mouth 2 (two) times daily.    Yes Historical Provider, MD  cholecalciferol (VITAMIN D) 1000 units tablet Take 1,000 Units by mouth daily.   Yes Historical Provider, MD  cilostazol (PLETAL) 50 MG tablet Take 50 mg by mouth 2 (two) times daily.     Yes Historical Provider, MD  donepezil (ARICEPT) 10 MG tablet Take 10 mg by mouth every evening.    Yes Historical Provider, MD  escitalopram (LEXAPRO) 5 MG tablet Take 10 mg by mouth at bedtime. Take two tablets by mouth at bedtime for mood   Yes Historical Provider, MD  furosemide (LASIX) 20 MG tablet Take 20 mg by mouth daily.     Yes Historical Provider, MD  gabapentin  (NEURONTIN) 300 MG capsule Take 300 mg by mouth 2 (two) times daily.     Yes Historical Provider, MD  levothyroxine (SYNTHROID, LEVOTHROID) 50 MCG tablet Take 50 mcg by mouth daily.     Yes Historical Provider, MD  lisinopril (PRINIVIL,ZESTRIL) 2.5 MG tablet Take 2.5 mg by mouth daily.   Yes Historical Provider, MD  memantine (NAMENDA) 10 MG tablet Take one tablet by mouth twice daily   Yes Historical Provider, MD  memantine (NAMENDA) 10 MG tablet Take 10 mg by mouth 2 (two) times daily.   Yes Historical Provider, MD  metFORMIN (GLUCOPHAGE) 500 MG tablet Take 500 mg by mouth 2 (two) times daily with a meal.     Yes Historical Provider, MD  omeprazole (PRILOSEC) 20 MG capsule Take 20 mg by mouth at bedtime.     Yes Historical Provider, MD  potassium chloride (KLOR-CON) 10 MEQ CR tablet Take 10 mEq by mouth daily.     Yes Historical Provider, MD  pravastatin (PRAVACHOL) 80 MG tablet Take 80 mg by mouth at bedtime.     Yes Historical Provider, MD  promethazine (PHENERGAN) 12.5 MG tablet Take 12.5 mg by mouth every 6 (six) hours as needed for nausea or vomiting.   Yes Historical Provider, MD   BP 107/58 mmHg  Pulse 74  Resp 14  SpO2 100% Physical Exam  Constitutional: She is oriented to person, place, and time. She appears well-developed.  HENT:  Head: Normocephalic.  Eyes: Conjunctivae and EOM are normal. No scleral icterus.  Neck: Neck supple. No thyromegaly present.  Cardiovascular: Normal rate and regular rhythm.  Exam reveals no gallop and no friction rub.   No murmur heard. Pulmonary/Chest: No stridor. She has no wheezes. She has no rales. She exhibits no tenderness.  Abdominal: She exhibits no distension. There is no tenderness. There is no rebound.  Musculoskeletal: Normal range of motion. She exhibits no edema.  Lymphadenopathy:    She has no cervical adenopathy.  Neurological: She is oriented to person, place, and time. She exhibits normal muscle tone. Coordination normal.  Skin:  No rash noted. No erythema.  Psychiatric: She has a normal mood and affect. Her behavior is normal.    ED Course  Procedures (including critical care time) Labs Review Labs Reviewed  CBC WITH DIFFERENTIAL/PLATELET - Abnormal; Notable for the following:    RBC 5.12 (*)    MCH 24.4 (*)    MCHC 29.9 (*)    All other components within normal limits  COMPREHENSIVE METABOLIC PANEL - Abnormal; Notable for the following:    Sodium 133 (*)    Chloride 99 (*)    Glucose, Bld 111 (*)    Creatinine, Ser 1.08 (*)    Calcium 8.4 (*)    Albumin 3.2 (*)    GFR calc non Af Amer 44 (*)  GFR calc Af Amer 50 (*)    All other components within normal limits    Imaging Review Dg Chest 2 View  06/18/2015  CLINICAL DATA:  Decreased oxygen saturation. Hypertension. History of breast carcinoma EXAM: CHEST  2 VIEW COMPARISON:  June 16, 2015 FINDINGS: There is no edema or consolidation. Heart is mildly enlarged with central pulmonary vascular prominence and abrupt peripheral tapering. Aorta is mildly tortuous with atherosclerotic calcification in the aorta. No adenopathy. Patient is status post left mastectomy with surgical clips in the left axillary region. IMPRESSION: No edema or consolidation. Heart mildly enlarged with what appears to be a degree of pulmonary arterial hypertension. No adenopathy evident. There is atherosclerotic calcification in the aorta. Electronically Signed   By: Lowella Grip III M.D.   On: 06/18/2015 17:00   I have personally reviewed and evaluated these images and lab results as part of my medical decision-making.   EKG Interpretation   Date/Time:  Thursday June 18 2015 15:43:33 EDT Ventricular Rate:  71 PR Interval:  190 QRS Duration: 73 QT Interval:  421 QTC Calculation: 457 R Axis:   86 Text Interpretation:  Sinus rhythm Borderline right axis deviation  Borderline T abnormalities, anterior leads Confirmed by Mann Skaggs  MD, Lorene Klimas  ([id number]) on 06/18/2015 9:29:30 PM       MDM   Final diagnoses:  Weakness    Labs and x-ray unremarkable. Patient less likely did not have hypoxia. There were poor results coming from her finger oxygen. But her ear oxygen was normal    Milton Ferguson, MD 06/18/15 2131

## 2015-06-18 NOTE — ED Notes (Signed)
Pt sent over from [facility] for decreased oxygen saturation and generalized weakness. Pt o2 saturation in ED, 100% on 2liters [place].pt alert and oriented.nad noted.

## 2015-06-21 DIAGNOSIS — R7981 Abnormal blood-gas level: Secondary | ICD-10-CM | POA: Insufficient documentation

## 2015-06-21 NOTE — Progress Notes (Signed)
Patient ID: Brandi Rice, female   DOB: [birth year], 82 y.o.   MRN: [id number]                    This is an  Acute   visit.  Level of care skilled.  Facility [facility].   Chief complaint- Acute visit secondary to hypoxia-chart is of breath-altered mental status .  History of present illness   Patient is a pleasant 82 year old female is been a long-term resident at this facility--she has a history of dementia diabetes type 2 previous history of pulmonary embolism she is on aspirin as well as clinical with a history of peripheral vascular disease as well.  Dr. Dellia Nims saw her over the weekend for altered mental status and some CV tenderness--apparently she was somewhat hypotensive as well.  He did give her IV fluids and ordered lab work which actually came back fairly unremarkable creatinine up a bit from baseline at 1.38.  She was also started on Rocephin for presumed UTI and urine culture has grown out Proteus.  2 days ago she apparently had an acute change her she became hypoxic with O2 saturation in the 80s this quickly rose into the 90s with oxygen supplementation she was diaphoretic-we did order a chest x-ray which did not show any acute process-we also ordered labs which were unremarkable creatinine was 0.99 electrolytes were within normal range white count was 7 hemoglobin 11.8.  Apparently she improved some yesterday and actually this morning was doing okay but this afternoon appears to be declining again she is somewhat more confused complaining of shortness of breath at times says she just doesn't feel well.  It's been difficult to get an oxygen saturation on her--we are able at one point due get a  reading of 87% but then became unreadable again-vital signs otherwise appear to be fairly stable.  I do note she does have a history distant of pulmonary embolism-she also has peripheral vascular disease is on plateau and aspirin    .   Marland Kitchen  Family medical social  history has been reviewed per discharge summary on 10/28/2008  .  Medications have been reviewed per [facility]  Include A-aspirin 81 mg daily-Lasix 20 mg a day-Lexapro 10 mg a day.  Calcium-.  Namenda 10 mg twice a day-Prilosec-Pletal 50mg -potassium 10 mEq a day-Pravachol-vitamin D-do nebs when necessary-vitamin D-Synthroid 50 g daily-Glucophage 500 mg twice a day--lisinopril 2.5 mg daily .  Review of systems.  General not really complaining of a fever but says she just does not feel well  Skin-is not complaining of any itching rashes questionable diaphoresis at times Head ears eyes nose mouth and throat-does not complainof sore throat nasal discharge or visual changes  Respiratory-at times is complaining of shortness of breath no cough has been noted Cardiac--no chest pain has some baseline lower extremity edema.  GI-denies any nausea vomiting diarrhea constipation or abdominal pain.  GU no complaints of dysuria.  Muscle skeletal-has complained of some leg pain in the past but does not complaining of that this afternoon Neurologic does not complain of any headache dizziness or syncopal-type episodes   Psych does have a history of dementia this appears to be mild-to-moderate .  Physical exam.   Temperature is 99.2 pulse 78 respirations 18 blood pressure 119/70 O2 saturation 87%?   Gen. this is a frail elderly female who appears weaker than her baseline slightly lethargic Her skin is warm and dry. - Eyes pupils equal round react to light sclera and  conjunctiva are clear Oropharynx is clear mucous membranes moist.   Chest -shallow air entry at could not appreciate overt congestion today there is no labored breathing  Heart regular rate and rhythm with a [id number] systolic murmur-she has baseline lower extremity edema obese changes-- Abdomen is soft nontender with positive bowel sounds it is obese.  Muscle skeletalGen. he appears weak but is able to transfer to bed and move all  her extremities appears at baseline  Neurologic-grossly intact speech is clear-- no lateralizing findings-grip strength is equal bilaterally  Psych she is oriented to self place Ithis appears roughly baseline although she is somewhat confused more than normally   .  Labs  06/16/2015.  WBC 7.0 hemoglobin 11.8 platelets 281.  Sodium 138 potassium 4.1 BUN 19 creatinine 0.99.  Albumin 2.8 otherwise liver function tests within normal limits  06/14/2015.  Sodium 140 potassium 4.6 BUN 22 creatinine 1.38 CO2 26.  Albumin 3.3 otherwise liver function tests within normal limits.  WBC 8.7 hemoglobin 12.9 platelets 257.      10/22/2014.  Sodium 141 potassium 4.2 BUN 10 creatinine 0.91  08/27/2014.  Sodium 141 potassium 4 BUN 12 creatinine 1.03.  Albumen 3.2 AST 14 ALT 8 otherwise liver function tests within normal limits.  WBC 8.0 hemoglobin 11.4 platelets 229.  Cholesterol 171 triglycerides 90 HDL 37 LDL 116.  Abdomen A1c 7.3.  TSH-1.473  05/26/2014.  WBC 5.6 hemoglobin 10.9 platelets 228.  Sodium 141 potassium 4.1 and CO2 33 creatinine 0.89 BUN 10.  Albumen 3.1 total bilirubin 0.2 otherwise liver function tests within normal limits.  Hemoglobin A1c-6.7  04/02/2014.  Vitamin D 27.  03/14/2014.  WBC 7.8 hemoglobin 11.9 platelets 298.  Sodium 143 potassium 4.2 BUN 10 creatinine 1.14   01/30/2014.  Cholesterol 191-triglycerides 122-HDL 35-LDL 132.  [id number].  Hemoglobin A1c 7.5.  WBC 5.0 hemoglobin 11.5 platelets 240.  Sodium 145 potassium 4.8 BUN 10 creatinine 1.1.  Liver function tests within normal limits except albumin of 3.2   10/07/2013. [id number].  Hemoglobin A1c 7.3.  WBC 4.6 hemoglobin 10.9 platelets 220.  Sodium 144 potassium 4.4 BUN 9 creatinine 0.9-.  Liver function tests within normal limits except albumin of 3.1 07/22/2013.  Cholesterol 152 triglycerides 109 HDL 33 LDL 97.  06/12/2013.  Hemoglobin A1c 6.8.  Sodium  143 potassium 4.3 BUN 8 creatinine 0.82.  WBC 4.4 hemoglobin 10.9 platelets 228  03/25/2013.  [id number].  WBC 5.2 hemoglobin 11.3 platelets 225.  Sodium 142 potassium 3.9 BUN 8 creatinine 0.88.  Liver function tests within normal limits except albumin of 3.8.  12/12/2012.  Cholesterol 155-triglycerides 104-LDL 98-HDL 36-.  Hemoglobin A1c--7.1.  CBC 4.6 hemoglobin 11.3 platelets 215.  Sodium 143 potassium 4 BUN 11 creatinine 0.98.  Marland Kitchen  08/31/2012.  Hemoglobin A1c 6.6.  WBC 4.4 hemoglobin 11.7 platelets 218.  Sodium 144 potassium 4.2 BUN 9 creatinine 0.93.  Bilirubin 0.2-albumin 3.2 otherwise liver function tests within normal limits  .  08/01/2012.  [id number].  2- 24th 2014.  WBC 4.7 hemoglobin 10.9 platelets 233.  Sodium 141 potassium 4.2 BUN 9 creatinine 0.88  05/14/2012.  Cholesterol 150 triglycerides 109 HDL 37 LDL 91.  03/07/2012.  Hemoglobin A1c 7.1.  Assessment and plan.    History of hypoxia with some altered mental status some complaints of shortness of breath intermittent-patient at times can be a poor historian which makes assessment difficult-nonetheless what appears to be possibly some hypoxia and shortness of breath and altered mental status-which appears to be recurrent from a couple days ago  is concerning-she does have a history of pulmonary embolism in the past-at this point we will sent to the ER for expedient evaluation-currently dot acute distress but patient does not appear to be at her baseline per serial exams this afternoon This plan of care was discussed with Dr. Linna Darner.  [id number]      .

## 2015-06-24 ENCOUNTER — Encounter: Payer: Self-pay | Admitting: Internal Medicine

## 2015-06-24 ENCOUNTER — Non-Acute Institutional Stay (SKILLED_NURSING_FACILITY): Payer: Medicare Other | Admitting: Internal Medicine

## 2015-06-24 DIAGNOSIS — R7981 Abnormal blood-gas level: Secondary | ICD-10-CM | POA: Diagnosis not present

## 2015-06-24 DIAGNOSIS — R531 Weakness: Secondary | ICD-10-CM | POA: Diagnosis not present

## 2015-06-24 DIAGNOSIS — R4182 Altered mental status, unspecified: Secondary | ICD-10-CM

## 2015-06-24 NOTE — Progress Notes (Signed)
Patient ID: Brandi Rice, female   DOB: 03-Oct-1921, 82 y.o.   MRN: [id number] Patient ID: Brandi Rice, female   DOB: [birth year], 82 y.o.   MRN: [id number]                    This is an  Acute   visit.  Level of care skilled.  Facility [facility].   Chief complaint- Acute visit follow-up ER visit for hypoxia-altered mental status weakness .  History of present illness   Patient is a pleasant 82 year old female is been a long-term resident at this facility--she has a history of dementia diabetes type 2 previous history of pulmonary embolism she is on aspirin as well as clinical with a history of peripheral vascular disease as well.  Dr. Dellia Nims recently for altered mental status and some CV tenderness--apparently she was somewhat hypotensive as well.  He did give her IV fluids and ordered lab work which actually came back fairly unremarkable creatinine up a bit from baseline at 1.38.  She was also started on Rocephin for presumed UTI and urine culture grew out Proteus. Later in the week she apparently had an acute change her she became hypoxic with O2 saturation in the 80s this quickly rose into the 90s with oxygen supplementation she was diaphoretic-we did order a chest x-ray which did not show any acute process-we also ordered labs which were unremarkable creatinine was 0.99 electrolytes were within normal range white count was 7 hemoglobin 11.8.  Apparently she improved some --but a couple days later she complained of increased weakness it was difficult to get a adequate oxygen saturation on her she appeared to be weaker than normal with some altered mental status   She was sent to the ER were actually her condition appeared to improve significantly in fact when they took her oxygen saturation they were able to get one at 100% on 2 L oxygen.  She apparently appeared stronger and was not making this complaints is prominently.  They did do lab work which was [employer]  x-ray also did not show any acute process and did show some degree of pulmonary arterial hypertension.  She has returned to the facility nursing--at times nursing staff doesn't feel she's been herself-however when I entered the room she was bright alert had been visiting with a neighbor and appeared to be her usual self in a jovial talkative mood-- She is not complaining of any shortness of breath or weakness vital signs have been stable says she feels she is doing well    .   Marland Kitchen  Family medical social history has been reviewed per discharge summary on 10/28/2008  .  Medications have been reviewed per [facility]  Include A-aspirin 81 mg daily-Lasix 20 mg a day-Lexapro 10 mg a day.  Calcium-.  Namenda 10 mg twice a day-Prilosec-Pletal 50mg -potassium 10 mEq a day-Pravachol-vitamin D-do nebs when necessary-vitamin D-Synthroid 50 g daily-Glucophage 500 mg twice a day--lisinopril 2.5 mg daily .  Review of systems.  General not complaining of any fever or chills Skin-is not complaining of any itching rashes questionable diaphoresis at times but this has not reoccurred for a while Head ears eyes nose mouth and throat-does not complainof sore throat nasal discharge or visual changes  Respiratory-not complaining of shortness breath or cough today Cardiac--no chest pain has some baseline lower extremity edema.  GI-denies any nausea vomiting diarrhea constipation or abdominal pain.  GU no complaints of dysuria.  Muscle skeletal-has complained of some leg pain  in the past but does not complaining of that this afternoon Neurologic does not complain of any headache dizziness or syncopal-type episodes   Psych does have a history of dementia this appears to be mild-to-moderate .  Physical exam.   Temperature is 96.7 pulse 78 respirations 16 blood pressure 119/70--O2 saturations are in the 90s   Gen. this is a frail elderly female who appears to be at her previous baseline she is bright  alert jovial  certainly appears stronger than when I saw her last week Her skin is warm and dry. - Eyes pupils equal round react to light sclera and conjunctiva are clear Oropharynx is clear mucous membranes moist.   Chest -clear to auscultation with no labored breathing Heart regular rate and rhythm with a A999333 systolic murmur-she has baseline lower extremity edema obese changes-- Abdomen is soft nontender with positive bowel sounds it is obese.  Muscle skeletal--strength appears to be at baseline moves all 4 extremities at baseline ambulating in wheelchair   Neurologic-grossly intact speech is clear-- no lateralizing findings-  Psych she is oriented to self place Ithis appears roughly baseline    .  Labs  Came in the ER on 06/18/2015.  WBC 7.3 hemoglobin 12.5 weight was 256.  Sodium 133 potassium 4.2 BUN 15 creatinine 1.08.  Albumin 3.2 otherwise liver function tests within normal limits.  Chest x-ray did not show any acute process did show a degree of pulmonary arterial hypertension  06/16/2015.  WBC 7.0 hemoglobin 11.8 platelets 281.  Sodium 138 potassium 4.1 BUN 19 creatinine 0.99.  Albumin 2.8 otherwise liver function tests within normal limits  06/14/2015.  Sodium 140 potassium 4.6 BUN 22 creatinine 1.38 CO2 26.  Albumin 3.3 otherwise liver function tests within normal limits.  WBC 8.7 hemoglobin 12.9 platelets 257.      10/22/2014.  Sodium 141 potassium 4.2 BUN 10 creatinine 0.91  08/27/2014.  Sodium 141 potassium 4 BUN 12 creatinine 1.03.  Albumen 3.2 AST 14 ALT 8 otherwise liver function tests within normal limits.  WBC 8.0 hemoglobin 11.4 platelets 229.  Cholesterol 171 triglycerides 90 HDL 37 LDL 116.  Abdomen A1c 7.3.  TSH-1.473  05/26/2014.  WBC 5.6 hemoglobin 10.9 platelets 228.  Sodium 141 potassium 4.1 and CO2 33 creatinine 0.89 BUN 10.  Albumen 3.1 total bilirubin 0.2 otherwise liver function tests within normal  limits.  Hemoglobin A1c-6.7  04/02/2014.  Vitamin D 27.  03/14/2014.  WBC 7.8 hemoglobin 11.9 platelets 298.  Sodium 143 potassium 4.2 BUN 10 creatinine 1.14   01/30/2014.  Cholesterol 191-triglycerides 122-HDL 35-LDL 132.  [id number].  Hemoglobin A1c 7.5.  WBC 5.0 hemoglobin 11.5 platelets 240.  Sodium 145 potassium 4.8 BUN 10 creatinine 1.1.  Liver function tests within normal limits except albumin of 3.2   10/07/2013. [id number].  Hemoglobin A1c 7.3.  WBC 4.6 hemoglobin 10.9 platelets 220.  Sodium 144 potassium 4.4 BUN 9 creatinine 0.9-.  Liver function tests within normal limits except albumin of 3.1 07/22/2013.  Cholesterol 152 triglycerides 109 HDL 33 LDL 97.  06/12/2013.  Hemoglobin A1c 6.8.  Sodium 143 potassium 4.3 BUN 8 creatinine 0.82.  WBC 4.4 hemoglobin 10.9 platelets 228  03/25/2013.  [id number].  WBC 5.2 hemoglobin 11.3 platelets 225.  Sodium 142 potassium 3.9 BUN 8 creatinine 0.88.  Liver function tests within normal limits except albumin of 3.8.  12/12/2012.  Cholesterol 155-triglycerides 104-LDL 98-HDL 36-.  Hemoglobin A1c--7.1.  CBC 4.6 hemoglobin 11.3 platelets 215.  Sodium 143 potassium 4 BUN 11 creatinine 0.98.  Marland Kitchen  08/31/2012.  Hemoglobin A1c 6.6.  WBC 4.4 hemoglobin 11.7 platelets 218.  Sodium 144 potassium 4.2 BUN 9 creatinine 0.93.  Bilirubin 0.2-albumin 3.2 otherwise liver function tests within normal limits  .  08/01/2012.  [id number].  2- 24th 2014.  WBC 4.7 hemoglobin 10.9 platelets 233.  Sodium 141 potassium 4.2 BUN 9 creatinine 0.88  05/14/2012.  Cholesterol 150 triglycerides 109 HDL 37 LDL 91.  03/07/2012.  Hemoglobin A1c 7.1.  Assessment and plan.    History of hypoxia with some altered mental status some complaints of shortness of breath  This all appears to have resolved-she is back at her baseline vital signs are stable does not complain of any increased weakness or shortness  of breath-again ER evaluation was quite unremarkable.  One would wonder possibly about some anxiety or psychiatric component at times-at this point will monitor she appears to be back at her baseline.  She does continue on Lexapro for a history of depression --has been relatively well controlled although have been some depressive episodes sometime in the past.  She also is on Namenda for a history of what appears to be mild dementia.  At this point continue to monitor she appears to be herself today which is encouraging.  [id number]       .

## 2015-08-05 ENCOUNTER — Non-Acute Institutional Stay (SKILLED_NURSING_FACILITY): Payer: Medicare Other | Admitting: Internal Medicine

## 2015-08-05 DIAGNOSIS — I10 Essential (primary) hypertension: Secondary | ICD-10-CM | POA: Diagnosis not present

## 2015-08-05 DIAGNOSIS — I739 Peripheral vascular disease, unspecified: Secondary | ICD-10-CM | POA: Diagnosis not present

## 2015-08-05 DIAGNOSIS — R609 Edema, unspecified: Secondary | ICD-10-CM

## 2015-08-05 DIAGNOSIS — E1121 Type 2 diabetes mellitus with diabetic nephropathy: Secondary | ICD-10-CM | POA: Diagnosis not present

## 2015-08-05 DIAGNOSIS — E038 Other specified hypothyroidism: Secondary | ICD-10-CM | POA: Diagnosis not present

## 2015-08-05 NOTE — Progress Notes (Signed)
Patient ID: Brandi Rice, female   DOB: 08/08/1921, 82 y.o.   MRN: [id number]   Location:  [facility] Room Number: 149 Place of Service:  SNF [phone]) Provider:  Altamese Dilling, MD  Patient Care Team: Fayrene Helper, MD as PCP - General  Extended Emergency Contact Information Primary Emergency Contact: Valarie Merino Address: [address], [place] Phone: [phone] Work Phone: [phone] Relation: Daughter  Code Status:  Full Code Goals of care: Advanced Directive information Advanced Directives 08/05/2015  Does patient have an advance directive? Yes  Type of Advance Directive (No Data)  Does patient want to make changes to advanced directive? No - Patient declined  Copy of advanced directive(s) in chart? Yes     Chief Complaint  Patient presents with  . Medical Management of Chronic Issues    Medical Management of Chronic Issues    HPI:  Pt is a 82 y.o. female seen today forMedical management of chronic medical conditions including dementia-hypertension-peripheral vascular disease-diabetes type 2-neuropathy-hypothyroidism.  She has been stable here the last month-she was seen late March for altered mental status. .  She has no complaints today.  She has she had one point received IV fluid secondary to hypotension.  She also was treated for a presumed UTI.  She did go to the ER subsequently at one time for hypoxia but apparently ER workup did not really show any acute process and by the time she got to the ER apparently she was not making any complaints of shortness of breath. Workup in the ER including chest x-ray and labs were unremarkable and she returned the facility.  She did go through a period where she was not quite herself but this does not appear to be the case today she is bright alert talkative jovial which is her baseline   She does have a history of  hypertension she is on low-dose lisinopril blood pressures appear stable-131/54-114/61 most recently.  She is a type II diabetic on Glucophage as been quite stable with CBGs ranging mainly from the 90s to low 100s.  Her dementia appears to be mild she is on Aricept and Namenda and is quite stable here with supportive care  He does have a history of edema this actually appears to be improved somewhat she is on Lasix with potassium-I do note a cardiac echo done back in 2010 showed ejection fraction 60-65 percent with grade 1 diastolic CHF.  She does not complain of any chest pain or shortness of breath.  Appears she's lost some weight since last summer I see listed weights back then in 2:30 range current weight is 208 this is been stable for at least a month I suspects this is somewhat desired weight loss-again her edema actually appears to be improved as well.  .       Past Medical History  Diagnosis Date  . PVD (peripheral vascular disease) ([place])   . History of supraventricular tachycardia   . Obesity   . Hyperlipidemia   . Hypertension   . Long term (current) use of anticoagulants     coumadin  . DM (diabetes mellitus) ([place])   . Dermatomycosis, unspecified   . Acute cystitis   . Other malaise and fatigue   . GERD (gastroesophageal reflux disease)   . Hypothyroidism   . DJD (degenerative joint disease)     Spine  . Dementia   .  History of pulmonary embolism    Past Surgical History  Procedure Laterality Date  . Mastectomy  2003    Left  . Thyroidectomy, partial  1962  . Atrial ablation surgery  01/2003    Catheter, to tachic palpitations    Allergies  Allergen Reactions  . Contrast Media [Iodinated Diagnostic Agents] Other (See Comments)    Unknown-listed on MAR-patient is not aware of these allergies  . Latex Other (See Comments)    Unknown-listed on MAR-patient is not aware of these allergies      Medication List       This list is accurate as of: 08/05/15   4:37 PM.  Always use your most recent med list.               acetaminophen 325 MG tablet  Commonly known as:  TYLENOL  Take 650 mg by mouth every 12 (twelve) hours. Take two tablets by mouth every 4 hours as needed for pain. Do not exceed APAP 3000mg /day from all sources.     aspirin 81 MG tablet  Take 81 mg by mouth daily.     calcium-vitamin D 500-200 MG-UNIT tablet  Commonly known as:  OSCAL WITH D  Take 1 tablet by mouth 2 (two) times daily.     cholecalciferol 1000 units tablet  Commonly known as:  VITAMIN D  Take 1,000 Units by mouth daily.     cilostazol 50 MG tablet  Commonly known as:  PLETAL  Take 50 mg by mouth 2 (two) times daily.     donepezil 10 MG tablet  Commonly known as:  ARICEPT  Take 10 mg by mouth every evening.     escitalopram 5 MG tablet  Commonly known as:  LEXAPRO  Take two tablets by mouth at bedtime for mood     furosemide 20 MG tablet  Commonly known as:  LASIX  Take 20 mg by mouth daily.     gabapentin 300 MG capsule  Commonly known as:  NEURONTIN  Take 300 mg by mouth 2 (two) times daily.     levothyroxine 50 MCG tablet  Commonly known as:  SYNTHROID, LEVOTHROID  Take 50 mcg by mouth daily.     lisinopril 2.5 MG tablet  Commonly known as:  PRINIVIL,ZESTRIL  Take 2.5 mg by mouth daily.     memantine 10 MG tablet  Commonly known as:  NAMENDA  Take one tablet by mouth twice daily     metFORMIN 500 MG tablet  Commonly known as:  GLUCOPHAGE  Take 500 mg by mouth 2 (two) times daily with a meal.     omeprazole 20 MG capsule  Commonly known as:  PRILOSEC  Take 20 mg by mouth at bedtime.     potassium chloride 10 MEQ CR tablet  Commonly known as:  KLOR-CON  Take 10 mEq by mouth daily.     pravastatin 80 MG tablet  Commonly known as:  PRAVACHOL  Take 80 mg by mouth at bedtime.        Review of Systems   In general does not complaining fever chills again she appears to have lost about 20 pounds in the past year.  Her  skin does not complain of any rashes or itching no recent diaphoresis.  Head ears eyes nose mouth throat and sore throat nasal discharge or visual changes.  Respiratory is not complaining of any shortness of breath or cough. Cardiac no chest pain edema appears to be somewhat improved lower extremities.  GI is not complaining nausea vomiting diarrhea constipation or abdominal pain.  GU does not complain of dysuria.  Muscle skeletal has at times complained of leg pain but is not complaining of that this evening.  Neurologic is not complaining of any dizziness headache or syncopal-type episodes.  In psych history of dementia and depression but this appears to be stable she is on low-dose Lexapro no recent complaints of a subdued mood.    Immunization History  Administered Date(s) Administered  . Influenza Whole 10/11/2006  . Td 01/14/2003   Pertinent  Health Maintenance Due  Topic Date Due  . FOOT EXAM  01/04/1934  . OPHTHALMOLOGY EXAM  01/04/1934  . DEXA SCAN  01/04/1989  . PNA vac Low Risk Adult (1 of 2 - PCV13) 01/04/1989  . HEMOGLOBIN A1C  07/20/2015  . INFLUENZA VACCINE  10/27/2015   No flowsheet data found. Functional Status Survey:    Filed Vitals:   08/05/15 1628  BP: 114/61  Pulse: 80  Temp: 98.2 F (36.8 C)  TempSrc: Oral  Resp: 18  Height: 5\' 8"  (1.727 m)  Weight: 208 lb 12.8 oz (94.711 kg)   Body mass index is 31.76 kg/(m^2). Physical Exam   In general this is a frail elderly female in no distress sitting comfortably in her wheelchair she is bright and alert and jovial.  Her skin is warm and dry.  Oropharynx clear mucous membranes moist.  Eyes pupils appear reactive to light sclerae and conjunctivae are clear his oral daily appears grossly intact.  Chest is clear to auscultation there is no labored breathing.  Heart is regular rate and rhythm with a slight I would say [id number] systolic murmur she has 1+ lower extremity edema I actually feel this is  somewhat less than her baseline pedal pulses are reduced bilaterally.  Abdomen is soft somewhat obese nontender with positive bowel sounds.  Muscle skeletal strength appears preserved upper and lower extremity she does have some lower extremity weakness chronically largely ablates her wheelchair although she will work with therapy and uses a walker   Neurologic is grossly intact her speech is clear no lateralizing findings cranial nerves intact.  Psych she is largely alert and oriented to self place is interactive I would say her dementia is mild  Labs reviewed:  Recent Labs  06/16/15 1610 06/17/15 0740 06/18/15 1618  NA 138 139 133*  K 4.1 4.2 4.2  CL 101 102 99*  CO2 29 29 24   GLUCOSE 107* 126* 111*  BUN 19 16 15   CREATININE 0.99 0.95 1.08*  CALCIUM 8.3* 8.3* 8.4*    Recent Labs  06/14/15 1800 06/16/15 1610 06/18/15 1618  AST 29 29 34  ALT 16 17 20   ALKPHOS 70 65 73  BILITOT 0.7 0.7 0.5  PROT 7.8 7.1 7.5  ALBUMIN 3.3* 2.8* 3.2*    Recent Labs  06/14/15 1800 06/16/15 06/16/15 1610 06/18/15 1618  WBC 8.7 7.0 7.0 7.3  NEUTROABS 5.6  --  4.3 5.0  HGB 12.9  --  11.8* 12.5  HCT 41.6  --  38.1 41.8  MCV 80.5  --  80.2 81.6  PLT 257  --  281 256   Lab Results  Component Value Date   TSH 2.255 01/19/2015   Lab Results  Component Value Date   HGBA1C 7.7* 01/19/2015   Lab Results  Component Value Date   CHOL 161 01/19/2015   HDL 34* 01/19/2015   LDLCALC 110* 01/19/2015   TRIG 87 01/19/2015  CHOLHDL 4.7 01/19/2015    Significant Diagnostic Results in last 30 days:  No results found.  Assessment/Plan  #1 history of altered mental status hypoxia-again this was an issue approximately 6 weeks ago but no reoccurrence she is back at her baseline no complaint shortness of breath vital signs are stable 11 or possibly body psychological component here.  Nonetheless this appears resolved with no reoccurrence.  She does continue on Lexapro for a history of  depression she did have a somewhat subdued mood several months ago but this appears to have resolved certainly appears back at her baseline this evening.  #2 history of peripheral vascular disease this is stable she is on Pletal  as well as aspirin.  #3 anemia this has been stable with a hemoglobin of 12.3 back in late March 2017.  #4 hypothyroidism this is been stable for some time recent TSH back in October 2016 was within normal range will update this yes H back in October was 2.. 255.  #5 history of dementia this appears to be mild she is on Namenda as well as Aricept she is back to being her jovial self doing well with supportive care.  #6 history of pulmonary embolism-this is a remote reoccurrence she continues on anticoagulation with aspirin and Pletal.  #7 history of hypertension she was recently started on low-dose lisinopril this appears stable recent blood pressures 131/54-114/61-134/70-125/72.  Number a history hyperlipidemia this is been relatively stable for some time were somewhat conservative secondary to her advanced age currently on pravastatin--LDL was 110 on lab done in October 2016--we will update this  #9-history GERD she is on a PPI this is been stable now for some time.  #10 history of diabetes type 2-hemoglobin A1c back in October was 7.7 again a fairly conservative here with her advanced age-she is on Glucophage CBGs appear to be quite satisfactory as noted above largely in the 90s to low 100s will update a hemoglobin A1c.  #11 history osteoporosis appears stable calcium supplementation.  #12 history of osteoarthritis she is stable has Tylenol as needed for pain this appears to be effective I suspect the pain 1 L legs may be somewhat more of a psychological component nonetheless this has been stable.  #13 history of neuropathy this appears relatively stable on Neurontin I do not know acute pain of her legs in factY palpated today she did not really complain but has  at times complained of this in the past  #14 history of mild hyponatremia with sodium of 133 on recent lab in late March will update this  #15 history of edema with history apparently of diastolic CHF-this appears stable her weight edema actually appears to be improved will update a metabolic panel since she has Lasix   [id number]-of note greater than 40 minutes spent assessing patient-reviewing her chart-her labs-and coordinating and formulating a plan of care for numerous diagnoses-of note greater than 50% of time spent coordinating plan of care

## 2015-08-06 ENCOUNTER — Encounter (HOSPITAL_COMMUNITY)
Admission: AD | Admit: 2015-08-06 | Discharge: 2015-08-06 | Disposition: A | Discharge status: Home / Self Care | Payer: Medicare Other | Source: Skilled Nursing Facility | Attending: Internal Medicine | Admitting: Internal Medicine

## 2015-08-06 DIAGNOSIS — M1990 Unspecified osteoarthritis, unspecified site: Secondary | ICD-10-CM | POA: Insufficient documentation

## 2015-08-06 DIAGNOSIS — M810 Age-related osteoporosis without current pathological fracture: Secondary | ICD-10-CM | POA: Diagnosis not present

## 2015-08-06 DIAGNOSIS — K219 Gastro-esophageal reflux disease without esophagitis: Secondary | ICD-10-CM | POA: Insufficient documentation

## 2015-08-06 DIAGNOSIS — E039 Hypothyroidism, unspecified: Secondary | ICD-10-CM | POA: Diagnosis not present

## 2015-08-06 DIAGNOSIS — I739 Peripheral vascular disease, unspecified: Secondary | ICD-10-CM | POA: Insufficient documentation

## 2015-08-06 DIAGNOSIS — I10 Essential (primary) hypertension: Secondary | ICD-10-CM | POA: Insufficient documentation

## 2015-08-06 DIAGNOSIS — N390 Urinary tract infection, site not specified: Secondary | ICD-10-CM | POA: Diagnosis present

## 2015-08-06 LAB — LIPID PANEL
Cholesterol: 149 mg/dL (ref 0–200)
HDL: 35 mg/dL — ABNORMAL LOW (ref >40)
LDL CALC: 93 mg/dL (ref 0–99)
TRIGLYCERIDES: 104 mg/dL (ref <150)
Total CHOL/HDL Ratio: 4.3 RATIO
VLDL: 21 mg/dL (ref 0–40)

## 2015-08-06 LAB — TSH: TSH: 1.678 u[IU]/mL (ref 0.350–4.500)

## 2015-08-06 LAB — COMPREHENSIVE METABOLIC PANEL
ALBUMIN: 3 g/dL — AB (ref 3.5–5.0)
ALK PHOS: 59 U/L (ref 38–126)
ALT: 8 U/L — AB (ref 14–54)
AST: 14 U/L — AB (ref 15–41)
Anion gap: 4 — ABNORMAL LOW (ref 5–15)
BUN: 8 mg/dL (ref 6–20)
CALCIUM: 8.4 mg/dL — AB (ref 8.9–10.3)
CHLORIDE: 107 mmol/L (ref 101–111)
CO2: 31 mmol/L (ref 22–32)
CREATININE: 0.87 mg/dL (ref 0.44–1.00)
GFR calc non Af Amer: 57 mL/min — ABNORMAL LOW (ref >60)
GLUCOSE: 103 mg/dL — AB (ref 65–99)
Potassium: 4 mmol/L (ref 3.5–5.1)
SODIUM: 142 mmol/L (ref 135–145)
Total Bilirubin: 0.3 mg/dL (ref 0.3–1.2)
Total Protein: 6.2 g/dL — ABNORMAL LOW (ref 6.5–8.1)

## 2015-08-07 LAB — HEMOGLOBIN A1C
Hgb A1c MFr Bld: 7 % — ABNORMAL HIGH (ref 4.8–5.6)
MEAN PLASMA GLUCOSE: 154 mg/dL

## 2015-09-16 ENCOUNTER — Non-Acute Institutional Stay (SKILLED_NURSING_FACILITY): Payer: Medicare Other | Admitting: Internal Medicine

## 2015-09-16 ENCOUNTER — Encounter: Payer: Self-pay | Admitting: Internal Medicine

## 2015-09-16 ENCOUNTER — Other Ambulatory Visit: Payer: Self-pay | Admitting: Internal Medicine

## 2015-09-16 DIAGNOSIS — I10 Essential (primary) hypertension: Secondary | ICD-10-CM | POA: Diagnosis not present

## 2015-09-16 DIAGNOSIS — E038 Other specified hypothyroidism: Secondary | ICD-10-CM

## 2015-09-16 DIAGNOSIS — E785 Hyperlipidemia, unspecified: Secondary | ICD-10-CM

## 2015-09-16 DIAGNOSIS — E1151 Type 2 diabetes mellitus with diabetic peripheral angiopathy without gangrene: Secondary | ICD-10-CM | POA: Diagnosis not present

## 2015-09-16 NOTE — Progress Notes (Signed)
Patient ID: Brandi Rice, female   DOB: October 21, 1921, 82 y.o.   MRN: [id number]    This is a nursing facility follow up of chronic medical diagnoses  Interim medical record and care since last [place]  visit was updated with review of diagnostic studies and change in clinical status since last visit were documented.  HPI: She has medical diagnoses of hypertension, peripheral vascular disease, hypothyroidism, dementia, dyslipidemia and controlled diabetes. She is on Pletal for the peripheral vascular disease. She is on both Aricept and Namendaed for the dementia. She takes an ACE inhibitor for hypertension as well as renal prophylaxis. She is on metformin for diabetes. Her most recent labs were 08/06/15. Glucose was 154; was adequate controlled with an A1c of 7.0%. She had mild hypocalcemia with a value of 8.4. There was mild renal impairment with a GFR of 57 .Albumin has been low varying from 2.8-3.2. Lipids were normal except for an HDL 35. TSH was therapeutic at 1.68.  Comprehensive review of systems is totally negative. All answers are "no".She does have dementia. She was able to give me the date as June 2017. She was not able to give me the date in June. She also could not name the president Constitutional: No fever,significant weight change, fatigue  Eyes: No redness, discharge, pain, vision change ENT/mouth: No nasal congestion,  purulent discharge, earache,change in hearing ,sore throat  Cardiovascular: No chest pain, palpitations,paroxysmal nocturnal dyspnea, claudication, edema  Respiratory: No cough, sputum production,hemoptysis, DOE , significant snoring,apnea   Gastrointestinal: No heartburn,dysphagia,abdominal pain, nausea / vomiting,rectal bleeding, melena,change in bowels Genitourinary: No dysuria,hematuria, pyuria,  incontinence, nocturia Musculoskeletal: No joint stiffness, joint swelling, weakness,pain Dermatologic: No rash, pruritus, change in appearance of  skin Neurologic: No dizziness,headache,syncope, seizures, numbness , tingling Psychiatric: No significant anxiety , depression, insomnia, anorexia Endocrine: No change in hair/skin/ nails, excessive thirst, excessive hunger, excessive urination  Hematologic/lymphatic: No significant bruising, lymphadenopathy,abnormal bleeding Allergy/immunology: No itchy/ watery eyes, significant sneezing, urticaria, angioedema  Physical exam:  Pertinent or positive findings: Arcus senilis is present. She is completely edentulous. Pedal pulses are decreased. She has trace pitting edema of the feet. She has isolated flexion contractures of the hands. General appearance:Adequately nourished; no acute distress , increased work of breathing is present.   Lymphatic: No lymphadenopathy about the head, neck, axilla . Eyes: No conjunctival inflammation or lid edema is present. There is no scleral icterus. Ears:  External ear exam shows no significant lesions or deformities.   Nose:  External nasal examination shows no deformity or inflammation. Nasal mucosa are pink and moist without lesions ,exudates Oral exam: lips and gums are healthy appearing.There is no oropharyngeal erythema or exudate . Neck:  No thyromegaly, masses, tenderness noted.    Heart:  Normal rate and regular rhythm. S1 and S2 normal without gallop, murmur, click, rub .  Lungs:Chest clear to auscultation without wheezes, rhonchi,rales , rubs. Abdomen:Bowel sounds are normal. Abdomen is soft and nontender with no organomegaly, hernias,masses. GU: deferred  Extremities:  No cyanosis, clubbing  Neurologic exam : Strength equal  in upper & lower extremities Balance,Rhomberg,finger to nose testing could not be completed due to clinical state Deep tendon reflexes are equal Skin: Warm & dry w/o tenting. No significant lesions or rash.    See summary under each active problem in the Visit Diagnoses List with associated updated therapeutic  plan

## 2015-09-16 NOTE — Assessment & Plan Note (Signed)
Blood pressure is actually slightly low. She is on very low-dose ACE inhibitor. Renal function will be checked; no change in the low-dose ACE inhibitor.

## 2015-09-16 NOTE — Assessment & Plan Note (Signed)
Recent studies in the medical literature suggest possible increased risk  compared to less benefit with statins in patients over 75.  Discontinuing the statin could be discussed in the multidisciplinary patient care meeting. Liver enzymes have been within normal. CK should be checked

## 2015-09-16 NOTE — Patient Instructions (Signed)
New orders for [facility] entry: BMET  CK

## 2015-09-16 NOTE — Assessment & Plan Note (Signed)
TSH was therapeutic 08/06/15. No change in thyroid dose. TSH should be monitored every 4-6 months.

## 2015-09-17 ENCOUNTER — Encounter (HOSPITAL_COMMUNITY)
Admission: RE | Admit: 2015-09-17 | Discharge: 2015-09-17 | Disposition: A | Discharge status: Home / Self Care | Payer: Medicare Other | Source: Skilled Nursing Facility | Attending: Internal Medicine | Admitting: Internal Medicine

## 2015-09-17 DIAGNOSIS — M1990 Unspecified osteoarthritis, unspecified site: Secondary | ICD-10-CM | POA: Insufficient documentation

## 2015-09-17 DIAGNOSIS — I739 Peripheral vascular disease, unspecified: Secondary | ICD-10-CM | POA: Insufficient documentation

## 2015-09-17 DIAGNOSIS — I10 Essential (primary) hypertension: Secondary | ICD-10-CM | POA: Diagnosis present

## 2015-09-17 DIAGNOSIS — M810 Age-related osteoporosis without current pathological fracture: Secondary | ICD-10-CM | POA: Diagnosis present

## 2015-09-17 DIAGNOSIS — K219 Gastro-esophageal reflux disease without esophagitis: Secondary | ICD-10-CM | POA: Diagnosis present

## 2015-09-17 LAB — BASIC METABOLIC PANEL
Anion gap: 8 (ref 5–15)
BUN: 11 mg/dL (ref 6–20)
CALCIUM: 8.8 mg/dL — AB (ref 8.9–10.3)
CHLORIDE: 103 mmol/L (ref 101–111)
CO2: 29 mmol/L (ref 22–32)
CREATININE: 0.93 mg/dL (ref 0.44–1.00)
GFR, EST NON AFRICAN AMERICAN: 52 mL/min — AB (ref >60)
Glucose, Bld: 111 mg/dL — ABNORMAL HIGH (ref 65–99)
Potassium: 4.3 mmol/L (ref 3.5–5.1)
SODIUM: 140 mmol/L (ref 135–145)

## 2015-09-17 LAB — CK: CK TOTAL: 33 U/L — AB (ref 38–234)

## 2015-10-13 ENCOUNTER — Non-Acute Institutional Stay (SKILLED_NURSING_FACILITY): Payer: Medicare Other | Admitting: Internal Medicine

## 2015-10-13 ENCOUNTER — Encounter: Payer: Self-pay | Admitting: Internal Medicine

## 2015-10-13 DIAGNOSIS — F0390 Unspecified dementia without behavioral disturbance: Secondary | ICD-10-CM | POA: Diagnosis not present

## 2015-10-13 DIAGNOSIS — I10 Essential (primary) hypertension: Secondary | ICD-10-CM | POA: Diagnosis not present

## 2015-10-13 DIAGNOSIS — E1151 Type 2 diabetes mellitus with diabetic peripheral angiopathy without gangrene: Secondary | ICD-10-CM | POA: Diagnosis not present

## 2015-10-13 DIAGNOSIS — E038 Other specified hypothyroidism: Secondary | ICD-10-CM

## 2015-10-13 DIAGNOSIS — I739 Peripheral vascular disease, unspecified: Secondary | ICD-10-CM

## 2015-10-13 NOTE — Progress Notes (Signed)
Location:   [facility] Room Number: 149/W Place of Service:  SNF [phone]) Provider:  Altamese Dilling, MD  Patient Care Team: Fayrene Helper, MD as PCP - General  Extended Emergency Contact Information Primary Emergency Contact: Valarie Merino Address: [place], [place] Phone: [phone] Work Phone: [phone] Relation: Daughter  Code Status:  Full Code Goals of care: Advanced Directive information Advanced Directives 10/13/2015  Does patient have an advance directive? Yes  Type of Advance Directive (No Data)  Does patient want to make changes to advanced directive? No - Patient declined  Copy of advanced directive(s) in chart? Yes     Chief Complaint  Patient presents with  . Medical Management of Chronic Issues    Routine visit    HPI:  Pt is a 82 y.o. female seen today for  Medical management of chronic medical issues including dementia-diabetes type 2-hypothyroidism-peripheral vascular disease-hypertension.  She continues to be quite stable.  She continues on Pletal for peripheral vascular disease.  Her dementia appears to be moderate she is on Aricept and Namenda.  Regards hypertension continues on an ACE inhibitor.  Continues on Glucophage for diabetes.  Hemoglobin A1c on 08/06/2015 was 7.0.--CBGs appear to be fairly consistently in the lower to mid 100s  TSH was therapeutic at 1.68.  Labs on 09/17/2015 showed renal function stable with a creatinine 0.93 BUN 11 calcium slightly minimally low at 8.8  Blood pressures. Appear to be well controlled most recently 127/68-weight is stable at 210.6       Past Medical History  Diagnosis Date  . PVD (peripheral vascular disease) ([place])   . History of supraventricular tachycardia   . Obesity   . Hyperlipidemia   . Hypertension   . Long term (current) use of anticoagulants     coumadin  . DM (diabetes  mellitus) ([place])   . Dermatomycosis, unspecified   . Acute cystitis   . Other malaise and fatigue   . GERD (gastroesophageal reflux disease)   . Hypothyroidism   . DJD (degenerative joint disease)     Spine  . Dementia   . History of pulmonary embolism    Past Surgical History  Procedure Laterality Date  . Mastectomy  2003    Left  . Thyroidectomy, partial  1962  . Atrial ablation surgery  01/2003    Catheter, to tachic palpitations    Allergies  Allergen Reactions  . Contrast Media [Iodinated Diagnostic Agents] Other (See Comments)    Unknown-listed on MAR-patient is not aware of these allergies  . Latex Other (See Comments)    Unknown-listed on MAR-patient is not aware of these allergies      Medication List       This list is accurate as of: 10/13/15  4:07 PM.  Always use your most recent med list.               acetaminophen 325 MG tablet  Commonly known as:  TYLENOL  Take 650 mg by mouth every 12 (twelve) hours. Take two tablets by mouth every 4 hours as needed for pain. Do not exceed APAP 3000mg /day from all sources.     aspirin 81 MG tablet  Take 81 mg by mouth daily.     calcium-vitamin D 500-200 MG-UNIT tablet  Commonly known as:  OSCAL WITH D  Take 1 tablet by mouth 2 (two) times daily.  cholecalciferol 1000 units tablet  Commonly known as:  VITAMIN D  Take 1,000 Units by mouth daily.     cilostazol 50 MG tablet  Commonly known as:  PLETAL  Take 50 mg by mouth 2 (two) times daily.     donepezil 10 MG tablet  Commonly known as:  ARICEPT  Take 10 mg by mouth every evening.     ENSURE CLEAR PO  Every morning to prevent weight loss     escitalopram 5 MG tablet  Commonly known as:  LEXAPRO  Take two tablets by mouth at bedtime for mood     furosemide 20 MG tablet  Commonly known as:  LASIX  Take 20 mg by mouth daily. Hold for systolic below BP below [id number]     gabapentin 300 MG capsule  Commonly known as:  NEURONTIN  Take 300 mg by mouth 2  (two) times daily.     levothyroxine 50 MCG tablet  Commonly known as:  SYNTHROID, LEVOTHROID  Take 50 mcg by mouth daily.     lisinopril 2.5 MG tablet  Commonly known as:  PRINIVIL,ZESTRIL  Take 2.5 mg by mouth daily.     memantine 10 MG tablet  Commonly known as:  NAMENDA  Take one tablet by mouth twice daily     metFORMIN 500 MG tablet  Commonly known as:  GLUCOPHAGE  Take 500 mg by mouth 2 (two) times daily with a meal.     omeprazole 20 MG capsule  Commonly known as:  PRILOSEC  Take 20 mg by mouth at bedtime.     potassium chloride 10 MEQ CR tablet  Commonly known as:  KLOR-CON  Take 10 mEq by mouth daily.     pravastatin 80 MG tablet  Commonly known as:  PRAVACHOL  Take 80 mg by mouth at bedtime.        Review of Systems   In general does not complaining of a fever chills weight appears to be relatively stable.  Says her appetite is not quite what it used to be feels she is eating adequately.  Skin does not complain of rashes or itching.  Head ears eyes nose mouth and throat no complaints of visual changes or sore throat.  Respiratory does not complain of shortness breath or cough.  Cardiac denies chest pain edema appears to be at baseline.  GI is not complaining of any abdominal discomfort dysphagia nausea vomiting diarrhea or constipation.  GU is not complaining of dysuria.  Muscle skeletal is not complaining of any joint pain or swelling.  Neurologic no complaints of dizziness headache or syncopal-type feelings or numbness.  In psych does have a history of dementia L classify this as somewhat moderate continues on Aricept and Namenda is doing quite well with supportive care and has for an extended period of time    Immunization History  Administered Date(s) Administered  . Influenza Whole 10/11/2006  . Influenza-Unspecified 03/19/2009  . Td 01/14/2003   Pertinent  Health Maintenance Due  Topic Date Due  . FOOT EXAM  [birth year]04/1932  .  OPHTHALMOLOGY EXAM  01[birth year]04/1932  . DEXA SCAN  01/04/1989  . PNA vac Low Risk Adult (1 of 2 - PCV13) 03/28/2016 (Originally 01/04/1989)  . INFLUENZA VACCINE  10/27/2015  . HEMOGLOBIN A1C  02/06/2016   No flowsheet data found. Functional Status Survey:    Filed Vitals:   10/13/15 1550  BP: 127/68  Pulse: 79  Temp: 97.4 F (36.3 C)  TempSrc: Oral  Resp: 19  Height: 5\' 9"  (1.753 m)  Weight: 206 lb (93.441 kg)   Body mass index is 30.41 kg/(m^2). Physical Exam  In general this is a pleasant elderly female in no distress lying comfortably in bed.  Her skin is warm and dry.  Eyes pupils appear reactive light visual acuity appears grossly intact she does have arcus senilis.  Oropharynx clear mucous membranes moist she is a dental list.  Chest is clear to auscultation there is no labored breathing.  Heart is regular rate and rhythm without murmur gallop or rub as I was a trace lower extremity edema most no spell over 50.  Abdomen is obese soft nontender with positive bowel sounds.  Musculoskeletal is able to move all extremities 4 ambulates in a wheelchair has arthritic changes of her hands bilaterally strength appears to be intact grip strength is strong bilaterally.  Neurologic is grossly intact her speech is clear no lateralizing findings.  Psych she is oriented to self Place I would classify dementia as moderate is conversational     Labs reviewed:  Recent Labs  06/18/15 1618 08/06/15 0720 09/17/15 0740  NA 133* 142 140  K 4.2 4.0 4.3  CL 99* 107 103  CO2 24 31 29   GLUCOSE 111* 103* 111*  BUN 15 8 11   CREATININE 1.08* 0.87 0.93  CALCIUM 8.4* 8.4* 8.8*    Recent Labs  06/16/15 1610 06/18/15 1618 08/06/15 0720  AST 29 34 14*  ALT 17 20 8*  ALKPHOS 65 73 59  BILITOT 0.7 0.5 0.3  PROT 7.1 7.5 6.2*  ALBUMIN 2.8* 3.2* 3.0*    Recent Labs  06/14/15 1800 06/16/15 06/16/15 1610 06/18/15 1618  WBC 8.7 7.0 7.0 7.3  NEUTROABS 5.6  --  4.3 5.0  HGB  12.9  --  11.8* 12.5  HCT 41.6  --  38.1 41.8  MCV 80.5  --  80.2 81.6  PLT 257  --  281 256   Lab Results  Component Value Date   TSH 1.678 08/06/2015   Lab Results  Component Value Date   HGBA1C 7.0* 08/06/2015   Lab Results  Component Value Date   CHOL 149 08/06/2015   HDL 35* 08/06/2015   LDLCALC 93 08/06/2015   TRIG 104 08/06/2015   CHOLHDL 4.3 08/06/2015    Significant Diagnostic Results in last 30 days:  No results found.  Assessment/Plan  Dementia-she continues on Aricept and Namenda she appears to do well with supportive care here at this point will monitor.  #2 hypertension this appears stable on low dose ACE inhibitor again with her history of diabetes A's would be beneficial as well.  #3 history diabetes type 2 continues on Glucophage will update a hemoglobin A1c last hemoglobin A1c was 7.0 back in March.  CBGs appear to be satisfactory in the lower mid 100s generally.  #4-history peripheral vascular disease this appears to be stable continues on Pletal--is not complaining of significant leg pain at this time although at times will complain of tenderness --suspect there is somewhat of a psychological component to  intermittent complaints of leg discomfort  as well  #5 history hypothyroidism TSH back in May was within normal range she is on Synthroid this should be repeated in approximately 2-3 months  Of note Will update a CBC to keep an eye on her hemoglobin which has been stable in the 11-12 range here last lab was done back in March will update this    [phone]  Oralia Manis, [place]

## 2015-10-14 ENCOUNTER — Encounter (HOSPITAL_COMMUNITY)
Admission: RE | Admit: 2015-10-14 | Discharge: 2015-10-14 | Disposition: A | Discharge status: Home / Self Care | Payer: Medicare Other | Source: Skilled Nursing Facility | Attending: Internal Medicine | Admitting: Internal Medicine

## 2015-10-14 DIAGNOSIS — F329 Major depressive disorder, single episode, unspecified: Secondary | ICD-10-CM | POA: Diagnosis present

## 2015-10-14 DIAGNOSIS — G629 Polyneuropathy, unspecified: Secondary | ICD-10-CM | POA: Insufficient documentation

## 2015-10-14 DIAGNOSIS — I739 Peripheral vascular disease, unspecified: Secondary | ICD-10-CM | POA: Diagnosis not present

## 2015-10-14 DIAGNOSIS — K219 Gastro-esophageal reflux disease without esophagitis: Secondary | ICD-10-CM | POA: Diagnosis not present

## 2015-10-14 DIAGNOSIS — E119 Type 2 diabetes mellitus without complications: Secondary | ICD-10-CM | POA: Diagnosis present

## 2015-10-14 LAB — CBC
HCT: 37.5 % (ref 36.0–46.0)
Hemoglobin: 11.4 g/dL — ABNORMAL LOW (ref 12.0–15.0)
MCH: 25.2 pg — ABNORMAL LOW (ref 26.0–34.0)
MCHC: 30.4 g/dL (ref 30.0–36.0)
MCV: 82.8 fL (ref 78.0–100.0)
PLATELETS: 189 10*3/uL (ref 150–400)
RBC: 4.53 MIL/uL (ref 3.87–5.11)
RDW: 14.4 % (ref 11.5–15.5)
WBC: 4.5 10*3/uL (ref 4.0–10.5)

## 2015-10-15 LAB — HEMOGLOBIN A1C
Hgb A1c MFr Bld: 6.5 % — ABNORMAL HIGH (ref 4.8–5.6)
Mean Plasma Glucose: 140 mg/dL

## 2015-11-26 ENCOUNTER — Encounter: Payer: Self-pay | Admitting: Internal Medicine

## 2015-12-01 NOTE — Progress Notes (Signed)
This encounter was created in error - please disregard.

## 2015-12-03 ENCOUNTER — Non-Acute Institutional Stay (SKILLED_NURSING_FACILITY): Payer: Medicare Other | Admitting: Internal Medicine

## 2015-12-03 ENCOUNTER — Encounter: Payer: Self-pay | Admitting: Internal Medicine

## 2015-12-03 DIAGNOSIS — F0390 Unspecified dementia without behavioral disturbance: Secondary | ICD-10-CM

## 2015-12-03 DIAGNOSIS — E1151 Type 2 diabetes mellitus with diabetic peripheral angiopathy without gangrene: Secondary | ICD-10-CM | POA: Diagnosis not present

## 2015-12-03 DIAGNOSIS — E785 Hyperlipidemia, unspecified: Secondary | ICD-10-CM | POA: Diagnosis not present

## 2015-12-03 DIAGNOSIS — E038 Other specified hypothyroidism: Secondary | ICD-10-CM

## 2015-12-03 NOTE — Progress Notes (Signed)
Location:   [facility] Room Number: 149/W Place of Service:  SNF [phone]) Provider:  Darrick Meigs, MD  Patient Care Team: Fayrene Helper, MD as PCP - General  Extended Emergency Contact Information Primary Emergency Contact: Valarie Merino Address: [place], [place] of [place] Phone: [phone] Work Phone: [phone] Relation: Daughter  Code Status:  Full Code Goals of care: Advanced Directive information Advanced Directives 12/03/2015  Does patient have an advance directive? Yes  Type of Advance Directive (No Data)  Does patient want to make changes to advanced directive? No - Patient declined  Copy of advanced directive(s) in chart? Yes     Chief Complaint  Patient presents with  . Medical Management of Chronic Issues    Routine Visit    HPI:  Pt is a 82 y.o. female seen today for medical management of chronic diseases. Patient is doing very well. She is pleasantly demented. Had no new complains.    Past Medical History:  Diagnosis Date  . Acute cystitis   . Dementia   . Dermatomycosis, unspecified   . DJD (degenerative joint disease)    Spine  . DM (diabetes mellitus) ([place])   . GERD (gastroesophageal reflux disease)   . History of pulmonary embolism   . History of supraventricular tachycardia   . Hyperlipidemia   . Hypertension   . Hypothyroidism   . Long term (current) use of anticoagulants    coumadin  . Obesity   . Other malaise and fatigue   . PVD (peripheral vascular disease) ([place])    Past Surgical History:  Procedure Laterality Date  . ATRIAL ABLATION SURGERY  01/2003   Catheter, to tachic palpitations  . MASTECTOMY  2003   Left  . THYROIDECTOMY, PARTIAL  1962    Allergies  Allergen Reactions  . Contrast Media [Iodinated Diagnostic Agents] Other (See Comments)    Unknown-listed on MAR-patient is not aware of these allergies  . Latex Other (See  Comments)    Unknown-listed on MAR-patient is not aware of these allergies      Medication List       Accurate as of 12/03/15 12:51 PM. Always use your most recent med list.          acetaminophen 325 MG tablet Commonly known as:  TYLENOL Take 650 mg by mouth every 12 (twelve) hours. Take two tablets by mouth every 4 hours as needed for pain. Do not exceed APAP 3000mg /day from all sources.   aspirin 81 MG tablet Take 81 mg by mouth daily.   calcium-vitamin D 500-200 MG-UNIT tablet Commonly known as:  OSCAL WITH D Take 1 tablet by mouth 2 (two) times daily.   cholecalciferol 1000 units tablet Commonly known as:  VITAMIN D Take 1,000 Units by mouth daily.   cilostazol 50 MG tablet Commonly known as:  PLETAL Take 50 mg by mouth 2 (two) times daily.   donepezil 10 MG tablet Commonly known as:  ARICEPT Take 10 mg by mouth every evening.   ENSURE CLEAR PO Every morning to prevent weight loss   escitalopram 5 MG tablet Commonly known as:  LEXAPRO Take two tablets by mouth at bedtime for mood   furosemide 20 MG tablet Commonly known as:  LASIX Take 20 mg by mouth daily. Hold for systolic below BP below 458   gabapentin 300 MG capsule Commonly known as:  NEURONTIN Take 300 mg  by mouth 2 (two) times daily.   levothyroxine 50 MCG tablet Commonly known as:  SYNTHROID, LEVOTHROID Take 50 mcg by mouth daily.   lisinopril 2.5 MG tablet Commonly known as:  PRINIVIL,ZESTRIL Take 2.5 mg by mouth daily.   memantine 10 MG tablet Commonly known as:  NAMENDA Take one tablet by mouth twice daily   metFORMIN 500 MG tablet Commonly known as:  GLUCOPHAGE Take 500 mg by mouth 2 (two) times daily with a meal.   omeprazole 20 MG capsule Commonly known as:  PRILOSEC Take 20 mg by mouth at bedtime.   potassium chloride 10 MEQ CR tablet Commonly known as:  KLOR-CON Take 10 mEq by mouth daily.   pravastatin 80 MG tablet Commonly known as:  PRAVACHOL Take 80 mg by mouth at  bedtime.       Review of Systems  Constitutional: Negative.   HENT: Negative.   Respiratory: Negative.   Cardiovascular: Negative.   Gastrointestinal: Negative.     Immunization History  Administered Date(s) Administered  . Influenza Whole 10/11/2006  . Influenza-Unspecified 03/19/2009  . Td 01/14/2003   Pertinent  Health Maintenance Due  Topic Date Due  . INFLUENZA VACCINE  02/26/2016 (Originally 10/27/2015)  . PNA vac Low Risk Adult (1 of 2 - PCV13) 03/28/2016 (Originally 01/04/1989)  . FOOT EXAM  11/25/2016 (Originally 01/04/1932)  . OPHTHALMOLOGY EXAM  11/25/2016 (Originally 01/04/1932)  . DEXA SCAN  11/25/2016 (Originally 01/04/1989)  . HEMOGLOBIN A1C  04/15/2016   No flowsheet data found. Functional Status Survey:    Vitals:   12/03/15 1250  BP: 130/70  Pulse: 74  Resp: 18  Temp: 97.8 F (36.6 C)  TempSrc: Oral  SpO2: 98%  Weight: 214 lb (97.1 kg)  Height: 5\' 9"  (1.753 m)   Body mass index is 31.6 kg/m. Physical Exam  Constitutional: She appears well-developed and well-nourished.  HENT:  Head: Normocephalic.  Mouth/Throat: Oropharynx is clear and moist.  Eyes: Pupils are equal, round, and reactive to light.  Neck: Neck supple.  Cardiovascular: Normal rate, regular rhythm and normal heart sounds.   Pulmonary/Chest: Effort normal and breath sounds normal. No respiratory distress. She has no wheezes. She has no rales. She exhibits no tenderness.  Abdominal: Soft. Bowel sounds are normal. She exhibits no distension. There is no tenderness. There is no rebound and no guarding.  Musculoskeletal: She exhibits edema.  Trace edema B/L  Neurological: She is alert.  Was oriented to Place but not time. Good motor strength in all extremities.    Labs reviewed:  Recent Labs  06/18/15 1618 08/06/15 0720 09/17/15 0740  NA 133* 142 140  K 4.2 4.0 4.3  CL 99* 107 103  CO2 24 31 29   GLUCOSE 111* 103* 111*  BUN 15 8 11   CREATININE 1.08* 0.87 0.93  CALCIUM  8.4* 8.4* 8.8*    Recent Labs  06/16/15 1610 06/18/15 1618 08/06/15 0720  AST 29 34 14*  ALT 17 20 8*  ALKPHOS 65 73 59  BILITOT 0.7 0.5 0.3  PROT 7.1 7.5 6.2*  ALBUMIN 2.8* 3.2* 3.0*    Recent Labs  06/14/15 1800  06/16/15 1610 06/18/15 1618 10/14/15 0700  WBC 8.7  < > 7.0 7.3 4.5  NEUTROABS 5.6  --  4.3 5.0  --   HGB 12.9  --  11.8* 12.5 11.4*  HCT 41.6  --  38.1 41.8 37.5  MCV 80.5  --  80.2 81.6 82.8  PLT 257  --  281 256 189  < > =  values in this interval not displayed. Lab Results  Component Value Date   TSH 1.678 08/06/2015   Lab Results  Component Value Date   HGBA1C 6.5 (H) 10/14/2015   Lab Results  Component Value Date   CHOL 149 08/06/2015   HDL 35 (L) 08/06/2015   LDLCALC 93 08/06/2015   TRIG 104 08/06/2015   CHOLHDL 4.3 08/06/2015    Significant Diagnostic Results in last 30 days:  No results found.  Assessment/Plan  Dementia  Patient doing very well on Aricept and Namenda. Continue supportive care.  Diabetes with Neuropathy. BS running in 140 HGB A1C was in 07/17 Change accu check to Every other day. Continue on Neurontin and Metformin.  Anemia  Stable HGB at 11.4 in 07/17   Hypothyroidism  Continue Synthroid. TSH was normal in 05/17  Hyperlipidemia.  LDL is below 100. LFTs are normal. Continue Pravastatin.  Patient full code. D/W family to renew the orders.

## 2016-01-05 LAB — HM DIABETES EYE EXAM

## 2016-01-06 ENCOUNTER — Encounter: Payer: Self-pay | Admitting: Internal Medicine

## 2016-01-06 ENCOUNTER — Non-Acute Institutional Stay (SKILLED_NURSING_FACILITY): Payer: Medicare Other | Admitting: Internal Medicine

## 2016-01-06 DIAGNOSIS — G309 Alzheimer's disease, unspecified: Secondary | ICD-10-CM | POA: Diagnosis not present

## 2016-01-06 DIAGNOSIS — F0280 Dementia in other diseases classified elsewhere without behavioral disturbance: Secondary | ICD-10-CM | POA: Diagnosis not present

## 2016-01-06 DIAGNOSIS — D649 Anemia, unspecified: Secondary | ICD-10-CM | POA: Diagnosis not present

## 2016-01-06 DIAGNOSIS — I10 Essential (primary) hypertension: Secondary | ICD-10-CM

## 2016-01-06 DIAGNOSIS — E1151 Type 2 diabetes mellitus with diabetic peripheral angiopathy without gangrene: Secondary | ICD-10-CM | POA: Diagnosis not present

## 2016-01-06 DIAGNOSIS — E038 Other specified hypothyroidism: Secondary | ICD-10-CM | POA: Diagnosis not present

## 2016-01-06 DIAGNOSIS — R609 Edema, unspecified: Secondary | ICD-10-CM

## 2016-01-06 NOTE — Progress Notes (Signed)
Location:   [facility] Room Number: 149/W Place of Service:  SNF [phone]) Provider:  Altamese Dilling, MD  Patient Care Team: Fayrene Helper, MD as PCP - General  Extended Emergency Contact Information Primary Emergency Contact: Valarie Merino Address: [place], [place] Phone: [phone] Work Phone: [phone] Relation: Daughter  Code Status:  Full Code Goals of care: Advanced Directive information Advanced Directives 01/06/2016  Does patient have an advance directive? Yes  Type of Advance Directive (No Data)  Does patient want to make changes to advanced directive? No - Patient declined  Copy of advanced directive(s) in chart? Yes     Chief Complaint  Patient presents with  . Medical Management of Chronic Issues    Routine Visit  Medical management of chronic medical issues including dementia-diabetes type 2-anemia-hypothyroidism-peripheral vascular disease-edema-  HPI:  Pt is a 82 y.o. female seen today for medical management of chronic diseases. As noted above.  She continues to enjoy a period of relative stability nursing staff does not report any issues and Niketa appears to be doing well with no complaints today.  She does have a history of dementia which classified as moderate she is on Aricept and Namenda and appears to be doing well with supportive care.  She is a type II diabetic she is on Glucophage blood sugars appear well controlled largely in the 90s to lower 100s.  She also is on Neurontin for suspected associated diabetic neuropathy.  She's also been started on a low-dose ACE inhibitor.  Blood pressures appear to be stable most was initially 114/71-122/79-128/74 again she is on lisinopril 2.5 mg daily.  She does have a history of lower extremity edema which appears to be baseline she is on low-dose Lasix Her weight appears relatively stable  at 2 14 the  past couple months she appears gradually gained about 6 pounds since May  She continues to have a good appetite.  Several months ago she appeared to be somewhat depressed with a subdued mood but this appears to have certainly turned around she is on Lexapro appears to be doing well she is bright alert laughing today her daughter actually is visiting her.        Past Medical History:  Diagnosis Date  . Acute cystitis   . Dementia   . Dermatomycosis, unspecified   . DJD (degenerative joint disease)    Spine  . DM (diabetes mellitus) ([place])   . GERD (gastroesophageal reflux disease)   . History of pulmonary embolism   . History of supraventricular tachycardia   . Hyperlipidemia   . Hypertension   . Hypothyroidism   . Long term (current) use of anticoagulants    coumadin  . Obesity   . Other malaise and fatigue   . PVD (peripheral vascular disease) ([place])    Past Surgical History:  Procedure Laterality Date  . ATRIAL ABLATION SURGERY  01/2003   Catheter, to tachic palpitations  . MASTECTOMY  2003   Left  . THYROIDECTOMY, PARTIAL  1962    Allergies  Allergen Reactions  . Contrast Media [Iodinated Diagnostic Agents] Other (See Comments)    Unknown-listed on MAR-patient is not aware of these allergies  . Latex Other (See Comments)    Unknown-listed on MAR-patient is not aware of these allergies      Medication List       Accurate as of 01/06/16  1:28 PM. Always use your most recent med list.          acetaminophen 325 MG tablet Commonly known as:  TYLENOL Take 650 mg by mouth every 12 (twelve) hours. Take two tablets by mouth every 4 hours as needed for pain. Do not exceed APAP 3000mg /day from all sources.   aspirin 81 MG tablet Take 81 mg by mouth daily.   calcium-vitamin D 500-200 MG-UNIT tablet Commonly known as:  OSCAL WITH D Take 1 tablet by mouth 2 (two) times daily.   cholecalciferol 1000 units tablet Commonly known as:  VITAMIN D Take 1,000 Units  by mouth daily.   cilostazol 50 MG tablet Commonly known as:  PLETAL Take 50 mg by mouth 2 (two) times daily.   donepezil 10 MG tablet Commonly known as:  ARICEPT Take 10 mg by mouth every evening.   ENSURE CLEAR PO Every morning to prevent weight loss   escitalopram 5 MG tablet Commonly known as:  LEXAPRO Take two tablets by mouth at bedtime for mood   furosemide 20 MG tablet Commonly known as:  LASIX Take 20 mg by mouth daily. Hold for systolic below BP below 637   gabapentin 300 MG capsule Commonly known as:  NEURONTIN Take 300 mg by mouth 2 (two) times daily.   levothyroxine 50 MCG tablet Commonly known as:  SYNTHROID, LEVOTHROID Take 50 mcg by mouth daily.   lisinopril 2.5 MG tablet Commonly known as:  PRINIVIL,ZESTRIL Take 2.5 mg by mouth daily.   memantine 10 MG tablet Commonly known as:  NAMENDA Take one tablet by mouth twice daily   metFORMIN 500 MG tablet Commonly known as:  GLUCOPHAGE Take 500 mg by mouth 2 (two) times daily with a meal.   omeprazole 20 MG capsule Commonly known as:  PRILOSEC Take 20 mg by mouth at bedtime.   potassium chloride 10 MEQ CR tablet Commonly known as:  KLOR-CON Take 10 mEq by mouth daily.   pravastatin 80 MG tablet Commonly known as:  PRAVACHOL Take 80 mg by mouth at bedtime.   ROBITUSSIN DM PO Give 15 ml by mouth every 6 hours for cough/congestion.       Review of Systems   In general does not the last couple months has had some gradual weight gain I suspect his appetite related.     Skin does not complain of rashes or itching.  Head ears eyes nose mouth and throat no complaints of visual changes or sore throat.  Respiratory does not complain of shortness breath or cough.  Cardiac denies chest pain edema appears to be at baseline.  GI is not complaining of any abdominal discomfort dysphagia nausea vomiting diarrhea or constipation.  GU is not complaining of dysuria.  Muscle skeletal is not  complaining of any joint pain or swelling.  Neurologic no complaints of dizziness headache or syncopal-type feelings or numbness.  Brandi Rice does have a history of dementia L classify this as somewhat moderate continues on Aricept and Namenda is doing quite well with supportive care and has for an extended period of time  Immunization History  Administered Date(s) Administered  . Influenza Whole 10/11/2006  . Influenza-Unspecified 03/19/2009  . Td 01/14/2003   Pertinent  Health Maintenance Due  Topic Date Due  . INFLUENZA VACCINE  02/26/2016 (Originally 10/27/2015)  . PNA vac Low Risk Adult (1 of 2 - PCV13) 03/28/2016 (Originally 01/04/1989)  . FOOT EXAM  11/25/2016 (Originally 01/04/1932)  . OPHTHALMOLOGY EXAM  11/25/2016 (Originally 01/04/1932)  .  DEXA SCAN  11/25/2016 (Originally 01/04/1989)  . HEMOGLOBIN A1C  04/15/2016   No flowsheet data found. Functional Status Survey:    Vitals:   01/03/16 1314  BP: 114/71  Pulse: 77  Resp: 17  Temp: 98.1 F (36.7 C)  TempSrc: Oral  SpO2: 98%  Weight: 214 lb 9.6 oz (97.3 kg)  Height: 5\' 9"  (1.753 m)   Body mass index is 31.69 kg/m. Physical Exam   In general this is a pleasant elderly female in no distress l sitting comfortably in her wheelchair  Her skin is warm and dry.  Eyes pupils appear reactive light visual acuity appears grossly intact she does have arcus senilis.  Oropharynx clear mucous membranes moist she is a dental list.  Chest is clear to auscultation there is no labored breathing.  Heart is regular rate and rhythm without murmur gallop or rub --trace lower extremity edema .  Abdomen is obese soft nontender with positive bowel sounds.  Musculoskeletal is able to move all extremities 4 ambulates in a wheelchair has arthritic changes of her hands bilaterally strength appears to be intact grip strength is strong bilaterally.  Neurologic is grossly intact her speech is clear no lateralizing  findings.  Psych she is oriented to self Place I would classify dementia as moderate is conversational laughing in good spirits today    Labs reviewed:  Recent Labs  06/18/15 1618 08/06/15 0720 09/17/15 0740  NA 133* 142 140  K 4.2 4.0 4.3  CL 99* 107 103  CO2 24 31 29   GLUCOSE 111* 103* 111*  BUN 15 8 11   CREATININE 1.08* 0.87 0.93  CALCIUM 8.4* 8.4* 8.8*    Recent Labs  06/16/15 1610 06/18/15 1618 08/06/15 0720  AST 29 34 14*  ALT 17 20 8*  ALKPHOS 65 73 59  BILITOT 0.7 0.5 0.3  PROT 7.1 7.5 6.2*  ALBUMIN 2.8* 3.2* 3.0*    Recent Labs  06/14/15 1800  06/16/15 1610 06/18/15 1618 10/14/15 0700  WBC 8.7  < > 7.0 7.3 4.5  NEUTROABS 5.6  --  4.3 5.0  --   HGB 12.9  --  11.8* 12.5 11.4*  HCT 41.6  --  38.1 41.8 37.5  MCV 80.5  --  80.2 81.6 82.8  PLT 257  --  281 256 189  < > = values in this interval not displayed. Lab Results  Component Value Date   TSH 1.678 08/06/2015   Lab Results  Component Value Date   HGBA1C 6.5 (H) 10/14/2015   Lab Results  Component Value Date   CHOL 149 08/06/2015   HDL 35 (L) 08/06/2015   LDLCALC 93 08/06/2015   TRIG 104 08/06/2015   CHOLHDL 4.3 08/06/2015    Significant Diagnostic Results in last 30 days:  No results found.  Assessment/Plan       Dementia-she continues on Aricept and Namenda she appears to do well with supportive care here at this point will monitor. She has gained some weight I suspect this is appetite related.    #2 hypertension this appears stable on low dose ACE inhibitor again with her history of diabetes s would be beneficial as well.  #3 history diabetes type 2 continues on Glucophage as well as Neurontin with some associated diabetic neuropathy-last hemoglobin A1c was 6.5 back in July will update this  CBGs appear to be satisfactory in th  90s-- lower mid 100s generally.  #4-history peripheral vascular disease this appears to be stable continues on Pletal--is not  complaining  of significant leg pain at this time although at times will complain of tenderness --suspect there is somewhat of a psychological component to  intermittent complaints of leg discomfort  as well--is not complaining of leg discomfort today  #5 history hypothyroidism TSH May 2017 was 1.678 will update this.  #6 history of anemia this appears stable with hemoglobins largely in the 11-12 range will update this as well.  #7 history of hyperlipidemia lipid panel back in May 2017 showed cholesterol 149 and LDL of 93 HDL 35 will update this for periodic monitoring.  #8 history of edema this appears stable I do not feel the weight gain is really fluid  related edema appears to be at baseline she is not complaining of any shortness of breath or increased weakness when ambulating in her wheelchair-she is on Lasix 20 mg a day with potassium supplementation Will update a metabolic panel to ensure stability here.  #9 history of depression she is on Lexapro use it appears to be doing very well in this regards she is bright alert smiling laughing which is her baseline-she has had periods where she appears somewhat sullen appearing but this has not occurred in some time which is encouraging.  #10 history of vitamin D deficiency will update a vitamin D level she is on supplementation.  [id number] note greater than 35 minutes spent assessing patient-reviewing her chart-reviewing her labs-and coordinating and formulating plan of care for numerous diagnoses-of note greater than 50% of time spent coordinating plan of care       [phone]

## 2016-01-07 ENCOUNTER — Encounter (HOSPITAL_COMMUNITY)
Admission: RE | Admit: 2016-01-07 | Discharge: 2016-01-07 | Disposition: A | Discharge status: Home / Self Care | Payer: Medicare Other | Source: Skilled Nursing Facility | Attending: Internal Medicine | Admitting: Internal Medicine

## 2016-01-07 DIAGNOSIS — I10 Essential (primary) hypertension: Secondary | ICD-10-CM | POA: Diagnosis present

## 2016-01-07 DIAGNOSIS — K219 Gastro-esophageal reflux disease without esophagitis: Secondary | ICD-10-CM | POA: Insufficient documentation

## 2016-01-07 DIAGNOSIS — I739 Peripheral vascular disease, unspecified: Secondary | ICD-10-CM | POA: Diagnosis not present

## 2016-01-07 DIAGNOSIS — F329 Major depressive disorder, single episode, unspecified: Secondary | ICD-10-CM | POA: Diagnosis present

## 2016-01-07 DIAGNOSIS — G629 Polyneuropathy, unspecified: Secondary | ICD-10-CM | POA: Insufficient documentation

## 2016-01-07 LAB — COMPREHENSIVE METABOLIC PANEL
ALBUMIN: 3.2 g/dL — AB (ref 3.5–5.0)
ALT: 9 U/L — ABNORMAL LOW (ref 14–54)
AST: 13 U/L — AB (ref 15–41)
Alkaline Phosphatase: 58 U/L (ref 38–126)
Anion gap: 4 — ABNORMAL LOW (ref 5–15)
BILIRUBIN TOTAL: 0.5 mg/dL (ref 0.3–1.2)
BUN: 11 mg/dL (ref 6–20)
CHLORIDE: 104 mmol/L (ref 101–111)
CO2: 32 mmol/L (ref 22–32)
Calcium: 8.7 mg/dL — ABNORMAL LOW (ref 8.9–10.3)
Creatinine, Ser: 0.94 mg/dL (ref 0.44–1.00)
GFR calc Af Amer: 59 mL/min — ABNORMAL LOW (ref >60)
GFR calc non Af Amer: 51 mL/min — ABNORMAL LOW (ref >60)
GLUCOSE: 124 mg/dL — AB (ref 65–99)
POTASSIUM: 4.3 mmol/L (ref 3.5–5.1)
Sodium: 140 mmol/L (ref 135–145)
Total Protein: 6.4 g/dL — ABNORMAL LOW (ref 6.5–8.1)

## 2016-01-07 LAB — CBC
HCT: 37.8 % (ref 36.0–46.0)
Hemoglobin: 11.5 g/dL — ABNORMAL LOW (ref 12.0–15.0)
MCH: 25.2 pg — ABNORMAL LOW (ref 26.0–34.0)
MCHC: 30.4 g/dL (ref 30.0–36.0)
MCV: 82.7 fL (ref 78.0–100.0)
PLATELETS: 216 10*3/uL (ref 150–400)
RBC: 4.57 MIL/uL (ref 3.87–5.11)
RDW: 14.1 % (ref 11.5–15.5)
WBC: 5.4 10*3/uL (ref 4.0–10.5)

## 2016-01-07 LAB — TSH: TSH: 1.117 u[IU]/mL (ref 0.350–4.500)

## 2016-01-08 LAB — VITAMIN D 25 HYDROXY (VIT D DEFICIENCY, FRACTURES): Vit D, 25-Hydroxy: 26.5 ng/mL — ABNORMAL LOW (ref 30.0–100.0)

## 2016-01-08 LAB — HEMOGLOBIN A1C
HEMOGLOBIN A1C: 7.1 % — AB (ref 4.8–5.6)
MEAN PLASMA GLUCOSE: 157 mg/dL

## 2016-02-02 ENCOUNTER — Encounter: Payer: Self-pay | Admitting: Internal Medicine

## 2016-02-02 ENCOUNTER — Non-Acute Institutional Stay (SKILLED_NURSING_FACILITY): Payer: Medicare Other | Admitting: Internal Medicine

## 2016-02-02 DIAGNOSIS — E038 Other specified hypothyroidism: Secondary | ICD-10-CM | POA: Diagnosis not present

## 2016-02-02 DIAGNOSIS — E1151 Type 2 diabetes mellitus with diabetic peripheral angiopathy without gangrene: Secondary | ICD-10-CM | POA: Diagnosis not present

## 2016-02-02 DIAGNOSIS — G309 Alzheimer's disease, unspecified: Secondary | ICD-10-CM | POA: Diagnosis not present

## 2016-02-02 DIAGNOSIS — E782 Mixed hyperlipidemia: Secondary | ICD-10-CM | POA: Diagnosis not present

## 2016-02-02 DIAGNOSIS — I739 Peripheral vascular disease, unspecified: Secondary | ICD-10-CM | POA: Diagnosis not present

## 2016-02-02 DIAGNOSIS — I10 Essential (primary) hypertension: Secondary | ICD-10-CM | POA: Diagnosis not present

## 2016-02-02 DIAGNOSIS — F0280 Dementia in other diseases classified elsewhere without behavioral disturbance: Secondary | ICD-10-CM | POA: Diagnosis not present

## 2016-02-02 NOTE — Progress Notes (Signed)
Location:   [facility] Room Number: 149/W Place of Service:  SNF [phone]) Provider:  Darrick Meigs, MD  Patient Care Team: Fayrene Helper, MD as PCP - General  Extended Emergency Contact Information Primary Emergency Contact: Valarie Merino Address: [place], [place] Phone: [phone] Work Phone: [phone] Relation: Daughter  Code Status:  Full Code Goals of care: Advanced Directive information Advanced Directives 02/02/2016  Does patient have an advance directive? Yes  Type of Advance Directive (No Data)  Does patient want to make changes to advanced directive? No - Patient declined  Copy of advanced directive(s) in chart? Yes     Chief Complaint  Patient presents with  . Medical Management of Chronic Issues    Routine Visit    HPI:  Pt is a 82 y.o. female seen today for medical management of chronic diseases. Patient has H/O  Dementia, Diabetes Type 2, Hypothyroidism, Peripheral vascular disease and Chronic Venous stasis, Hyperlipidemia. Patient is doing well in facility. She says she is comfortable and do not have any complains. Her weight has been stable at 216 lbs. She has gained 2 lbs since last month.   Past Medical History:  Diagnosis Date  . Acute cystitis   . Dementia   . Dermatomycosis, unspecified   . DJD (degenerative joint disease)    Spine  . DM (diabetes mellitus) ([facility])   . GERD (gastroesophageal reflux disease)   . History of pulmonary embolism   . History of supraventricular tachycardia   . Hyperlipidemia   . Hypertension   . Hypothyroidism   . Long term (current) use of anticoagulants    coumadin  . Obesity   . Other malaise and fatigue   . PVD (peripheral vascular disease) ([facility])    Past Surgical History:  Procedure Laterality Date  . ATRIAL ABLATION SURGERY  01/2003   Catheter, to tachic palpitations  . MASTECTOMY  2003   Left  .  THYROIDECTOMY, PARTIAL  1962    Allergies  Allergen Reactions  . Contrast Media [Iodinated Diagnostic Agents] Other (See Comments)    Unknown-listed on MAR-patient is not aware of these allergies  . Latex Other (See Comments)    Unknown-listed on MAR-patient is not aware of these allergies      Medication List       Accurate as of 02/02/16 11:21 AM. Always use your most recent med list.          acetaminophen 325 MG tablet Commonly known as:  TYLENOL Take 650 mg by mouth 2 (two) times daily.   aspirin 81 MG tablet Take 81 mg by mouth daily.   calcium-vitamin D 500-200 MG-UNIT tablet Commonly known as:  OSCAL WITH D Take 1 tablet by mouth 2 (two) times daily.   cholecalciferol 1000 units tablet Commonly known as:  VITAMIN D Take 1,000 Units by mouth daily.   cilostazol 50 MG tablet Commonly known as:  PLETAL Take 50 mg by mouth 2 (two) times daily.   donepezil 10 MG tablet Commonly known as:  ARICEPT Take 10 mg by mouth every evening.   escitalopram 5 MG tablet Commonly known as:  LEXAPRO Take two tablets by mouth at bedtime for mood   furosemide 20 MG tablet Commonly known as:  LASIX Take 20 mg by mouth daily. Hold for systolic below BP below 481   gabapentin 300 MG capsule Commonly known as:  NEURONTIN Take 300 mg by mouth 2 (two) times daily.   levothyroxine 50 MCG tablet Commonly known as:  SYNTHROID, LEVOTHROID Take 50 mcg by mouth daily.   lisinopril 2.5 MG tablet Commonly known as:  PRINIVIL,ZESTRIL Take 2.5 mg by mouth daily.   memantine 10 MG tablet Commonly known as:  NAMENDA Take one tablet by mouth twice daily   metFORMIN 500 MG tablet Commonly known as:  GLUCOPHAGE Take 500 mg by mouth 2 (two) times daily with a meal.   omeprazole 20 MG capsule Commonly known as:  PRILOSEC Take 20 mg by mouth at bedtime.   potassium chloride 10 MEQ CR tablet Commonly known as:  KLOR-CON Take 10 mEq by mouth daily.   pravastatin 80 MG  tablet Commonly known as:  PRAVACHOL Take 80 mg by mouth at bedtime.   ROBITUSSIN DM PO Give 15 ml by mouth every 6 hours for cough/congestion.       Review of Systems  Constitutional: Negative for activity change, appetite change, chills, diaphoresis, fatigue, fever and unexpected weight change.  HENT: Negative for congestion, dental problem, postnasal drip and rhinorrhea.   Respiratory: Negative for apnea, cough, choking, chest tightness, shortness of breath and stridor.   Cardiovascular: Positive for leg swelling. Negative for chest pain and palpitations.  Gastrointestinal: Negative for abdominal distention, abdominal pain, anal bleeding, blood in stool, constipation, diarrhea, nausea and rectal pain.  Musculoskeletal: Negative for arthralgias, back pain, gait problem, joint swelling and myalgias.  Neurological: Negative for dizziness, syncope, weakness and numbness.  Psychiatric/Behavioral: Negative for agitation, behavioral problems, confusion and dysphoric mood.    Immunization History  Administered Date(s) Administered  . Influenza Whole 10/11/2006  . Influenza-Unspecified 03/19/2009  . Td 01/14/2003   Pertinent  Health Maintenance Due  Topic Date Due  . PNA vac Low Risk Adult (1 of 2 - PCV13) 03/28/2016 (Originally 01/04/1989)  . INFLUENZA VACCINE  06/25/2016 (Originally 10/27/2015)  . FOOT EXAM  11/25/2016 (Originally 01/04/1932)  . DEXA SCAN  11/25/2016 (Originally 01/04/1989)  . HEMOGLOBIN A1C  07/07/2016  . OPHTHALMOLOGY EXAM  01/04/2017   No flowsheet data found. Functional Status Survey:    Vitals:   02/02/16 1106  BP: 124/74  Pulse: 76  Resp: 16  Temp: 98.2 F (36.8 C)  TempSrc: Oral   There is no height or weight on file to calculate BMI. Physical Exam  Constitutional: She is oriented to person, place, and time. She appears well-developed and well-nourished.  HENT:  Head: Normocephalic.  Mouth/Throat: Oropharynx is clear and moist.  Cardiovascular:  Normal rate, regular rhythm and normal heart sounds.   Pulmonary/Chest: Effort normal and breath sounds normal. No respiratory distress. She has no wheezes. She has no rales.  Abdominal: Soft. Bowel sounds are normal. She exhibits no distension. There is no tenderness. There is no guarding.  Musculoskeletal: She exhibits edema.  Neurological: She is alert and oriented to person, place, and time.  Skin: No rash noted. No erythema.  Psychiatric: She has a normal mood and affect. Her behavior is normal. Thought content normal.    Labs reviewed:  Recent Labs  08/06/15 0720 09/17/15 0740 01/07/16 0500  NA 142 140 140  K 4.0 4.3 4.3  CL 107 103 104  CO2 31 29 32  GLUCOSE 103* 111* 124*  BUN 8 11 11   CREATININE 0.87 0.93 0.94  CALCIUM 8.4* 8.8* 8.7*    Recent Labs  06/18/15 1618 08/06/15 0720 01/07/16 0500  AST 34 14* 13*  ALT 20 8*  9*  ALKPHOS 73 59 58  BILITOT 0.5 0.3 0.5  PROT 7.5 6.2* 6.4*  ALBUMIN 3.2* 3.0* 3.2*    Recent Labs  06/14/15 1800  06/16/15 1610 06/18/15 1618 10/14/15 0700 01/07/16 0500  WBC 8.7  < > 7.0 7.3 4.5 5.4  NEUTROABS 5.6  --  4.3 5.0  --   --   HGB 12.9  --  11.8* 12.5 11.4* 11.5*  HCT 41.6  --  38.1 41.8 37.5 37.8  MCV 80.5  --  80.2 81.6 82.8 82.7  PLT 257  --  281 256 189 216  < > = values in this interval not displayed. Lab Results  Component Value Date   TSH 1.117 01/07/2016   Lab Results  Component Value Date   HGBA1C 7.1 (H) 01/07/2016   Lab Results  Component Value Date   CHOL 149 08/06/2015   HDL 35 (L) 08/06/2015   LDLCALC 93 08/06/2015   TRIG 104 08/06/2015   CHOLHDL 4.3 08/06/2015    Significant Diagnostic Results in last 30 days:  No results found.  Assessment/Plan  Diabetes mellitus  BS running in 124-132 A1C is 7.1  In 10/17 which is good for Patient at her age. Continue same dose of metformin.  Essential hypertension BP well controlled. Continue low dose of lisinopril  Peripheral vascular disease  (HCC) Stable on aspirin and Pletal   Hypothyroidism TSH is Normal.  Alzheimer's dementia  Patient stable on Aricept and nemenda  Mixed hyperlipidemia Continue Prevastatin  Low Vitamin D Increase the dose.  Repeat labs in 8 weeks to follow Vit D level, BMP and lipids.    Family/ staff Communication:   Labs/tests ordered:

## 2016-03-01 ENCOUNTER — Encounter (HOSPITAL_COMMUNITY)
Admission: AD | Admit: 2016-03-01 | Discharge: 2016-03-01 | Disposition: A | Discharge status: Home / Self Care | Payer: Medicare Other | Source: Skilled Nursing Facility | Attending: Internal Medicine | Admitting: Internal Medicine

## 2016-03-01 DIAGNOSIS — I739 Peripheral vascular disease, unspecified: Secondary | ICD-10-CM | POA: Insufficient documentation

## 2016-03-01 DIAGNOSIS — G629 Polyneuropathy, unspecified: Secondary | ICD-10-CM | POA: Insufficient documentation

## 2016-03-01 DIAGNOSIS — F329 Major depressive disorder, single episode, unspecified: Secondary | ICD-10-CM | POA: Insufficient documentation

## 2016-03-01 DIAGNOSIS — I10 Essential (primary) hypertension: Secondary | ICD-10-CM | POA: Insufficient documentation

## 2016-03-01 DIAGNOSIS — K219 Gastro-esophageal reflux disease without esophagitis: Secondary | ICD-10-CM | POA: Insufficient documentation

## 2016-03-01 LAB — LIPID PANEL
CHOLESTEROL: 155 mg/dL (ref 0–200)
HDL: 39 mg/dL — ABNORMAL LOW (ref >40)
LDL Cholesterol: 105 mg/dL — ABNORMAL HIGH (ref 0–99)
TRIGLYCERIDES: 56 mg/dL (ref <150)
Total CHOL/HDL Ratio: 4 RATIO
VLDL: 11 mg/dL (ref 0–40)

## 2016-03-01 LAB — BASIC METABOLIC PANEL
Anion gap: 6 (ref 5–15)
BUN: 10 mg/dL (ref 6–20)
CHLORIDE: 104 mmol/L (ref 101–111)
CO2: 30 mmol/L (ref 22–32)
CREATININE: 0.85 mg/dL (ref 0.44–1.00)
Calcium: 8.6 mg/dL — ABNORMAL LOW (ref 8.9–10.3)
GFR, EST NON AFRICAN AMERICAN: 58 mL/min — AB (ref >60)
Glucose, Bld: 138 mg/dL — ABNORMAL HIGH (ref 65–99)
Potassium: 4.2 mmol/L (ref 3.5–5.1)
SODIUM: 140 mmol/L (ref 135–145)

## 2016-03-02 LAB — VITAMIN D 25 HYDROXY (VIT D DEFICIENCY, FRACTURES): VIT D 25 HYDROXY: 35.9 ng/mL (ref 30.0–100.0)

## 2016-04-26 ENCOUNTER — Encounter: Payer: Self-pay | Admitting: Internal Medicine

## 2016-04-26 ENCOUNTER — Non-Acute Institutional Stay (SKILLED_NURSING_FACILITY): Payer: Medicare Other | Admitting: Internal Medicine

## 2016-04-26 DIAGNOSIS — G309 Alzheimer's disease, unspecified: Secondary | ICD-10-CM

## 2016-04-26 DIAGNOSIS — F0280 Dementia in other diseases classified elsewhere without behavioral disturbance: Secondary | ICD-10-CM

## 2016-04-26 DIAGNOSIS — E785 Hyperlipidemia, unspecified: Secondary | ICD-10-CM

## 2016-04-26 DIAGNOSIS — E1151 Type 2 diabetes mellitus with diabetic peripheral angiopathy without gangrene: Secondary | ICD-10-CM | POA: Diagnosis not present

## 2016-04-26 DIAGNOSIS — E038 Other specified hypothyroidism: Secondary | ICD-10-CM

## 2016-04-26 DIAGNOSIS — R609 Edema, unspecified: Secondary | ICD-10-CM

## 2016-04-26 NOTE — Progress Notes (Signed)
Location:   [facility] Room Number: 149/W Place of Service:  SNF [phone]) Provider:  Altamese Dilling, MD  Patient Care Team: Fayrene Helper, MD as PCP - General  Extended Emergency Contact Information Primary Emergency Contact: Valarie Merino Address: [place], [place] Phone: [phone] Work Phone: [phone] Relation: Daughter  Code Status:  [place] Goals of care: Advanced Directive information Advanced Directives 04/26/2016  Does Patient Have a Medical Advance Directive? Yes  Type of Advance Directive (No Data)  Does patient want to make changes to medical advance directive? No - Patient declined  Copy of [place] in Chart? -     Chief Complaint  Patient presents with  . Medical Management of Chronic Issues    [place] Visit  Medical management of chronic medical conditions including dementia-diabetes type 2-peripheral vascular disease-hypothyroidism-hyperlipidemia-depression-neuropathy-history of edema  HPI:  Pt is a 83 y.o. female seen today for medical management of chronic diseases.  As noted above-she continues to be quite stable she does have a history of dementia she is on Aricept and Namenda dementia appears to be mild/moderate-continues to do well with supportive care she is actually gained it appears about 6 pounds over the past couple months she does appear equally well she does has not food at bedside I suspect this is contributing to this-she has edema this appears to be relatively baseline however she often keeps her legs in dependent position.  Regards to other medical issues these appear to be stable as well she is on Glucophage with a history diabetes type 2 blood sugars are largely in the mid 100s range most recent hemoglobin A1c was 7.1 back in October 2017 will update this.  She has a history of peripheral vascular disease as well as  suspicions of possibly some diabetic neuropathy she is on aspirin and plateau-she is also on Neurontin for the neuropathy she is not complaining of leg pain today continues to ambulate about facility in her wheelchair.  She does have a listed history depressionat one point. she was not quite herself but this has been some time ago she is now bright alert and interactive very talkative she is on Lexapro.  Currently she is sitting in her wheelchair comfortably is ambulating about the hallways-she has no complaints says "life is good"   Past Medical History:  Diagnosis Date  . Acute cystitis   . Dementia   . Dermatomycosis, unspecified   . DJD (degenerative joint disease)    Spine  . DM (diabetes mellitus) ([place])   . GERD (gastroesophageal reflux disease)   . History of pulmonary embolism   . History of supraventricular tachycardia   . Hyperlipidemia   . Hypertension   . Hypothyroidism   . Long term (current) use of anticoagulants    coumadin  . Obesity   . Other malaise and fatigue   . PVD (peripheral vascular disease) ([place])    Past Surgical History:  Procedure Laterality Date  . ATRIAL ABLATION SURGERY  01/2003   Catheter, to tachic palpitations  . MASTECTOMY  2003   Left  . THYROIDECTOMY, PARTIAL  1962    Allergies  Allergen Reactions  . Contrast Media [Iodinated Diagnostic Agents] Other (See Comments)    Unknown-listed on MAR-patient is not aware of these allergies  . Latex Other (See Comments)    Unknown-listed on MAR-patient is not aware of these allergies  Allergies as of 04/26/2016      Reactions   Contrast Media [iodinated Diagnostic Agents] Other (See Comments)   Unknown-listed on MAR-patient is not aware of these allergies   Latex Other (See Comments)   Unknown-listed on MAR-patient is not aware of these allergies      Medication List       Accurate as of 04/26/16 10:28 AM. Always use your most recent med list.          acetaminophen 325 MG  tablet Commonly known as:  TYLENOL Take 650 mg by mouth 2 (two) times daily.   aspirin 81 MG tablet Take 81 mg by mouth daily.   calcium-vitamin D 500-200 MG-UNIT tablet Commonly known as:  OSCAL WITH D Take 1 tablet by mouth 2 (two) times daily.   cholecalciferol 1000 units tablet Commonly known as:  VITAMIN D Take 1,000 Units by mouth 2 (two) times daily.   cilostazol 50 MG tablet Commonly known as:  PLETAL Take 50 mg by mouth 2 (two) times daily.   donepezil 10 MG tablet Commonly known as:  ARICEPT Take 10 mg by mouth every evening.   escitalopram 5 MG tablet Commonly known as:  LEXAPRO Take two tablets by mouth at bedtime for mood   furosemide 20 MG tablet Commonly known as:  LASIX Take 20 mg by mouth daily. Hold for systolic below BP below 732   gabapentin 300 MG capsule Commonly known as:  NEURONTIN Take 300 mg by mouth 2 (two) times daily.   levothyroxine 50 MCG tablet Commonly known as:  SYNTHROID, LEVOTHROID Take 50 mcg by mouth daily.   lisinopril 2.5 MG tablet Commonly known as:  PRINIVIL,ZESTRIL Take 2.5 mg by mouth daily.   memantine 10 MG tablet Commonly known as:  NAMENDA Take one tablet by mouth twice daily   metFORMIN 500 MG tablet Commonly known as:  GLUCOPHAGE Take 500 mg by mouth 2 (two) times daily with a meal.   omeprazole 20 MG capsule Commonly known as:  PRILOSEC Take 20 mg by mouth at bedtime.   potassium chloride 10 MEQ CR tablet Commonly known as:  KLOR-CON Take 10 mEq by mouth daily.   pravastatin 80 MG tablet Commonly known as:  PRAVACHOL Take 80 mg by mouth at bedtime.   ROBITUSSIN DM PO Give 15 ml by mouth every 6 hours for cough/congestion.       Review of Systems   In general does not complaining fever chills continues to have some weight gain suspect this is more appetite related.     Skin does not complain of rashes or itching.  Head ears eyes nose mouth and throat no complaints of visual changes or  sore throat.  Respiratory does not complain of shortness breath or cough.  Cardiac denies chest pain --continues to have some lower extremity edema bilaterally  GI is not complaining of any abdominal discomfort dysphagia nausea vomiting diarrhea or constipation.  GU is not complaining of dysuria.  Muscle skeletal is not complaining of any joint pain or swelling.  Neurologic no complaints of dizziness headache or syncopal-type feelings or numbness.  Psych-is not complaining of any anxiety or depression continues to be in good spirits feels life is going well   Immunization History  Administered Date(s) Administered  . Influenza Whole 10/11/2006  . Influenza-Unspecified 03/19/2009  . Td 01/14/2003   Pertinent  Health Maintenance Due  Topic Date Due  . INFLUENZA VACCINE  06/25/2016 (Originally 10/27/2015)  . FOOT EXAM  11/25/2016 (  Originally 01/04/1932)  . DEXA SCAN  11/25/2016 (Originally 01/04/1989)  . PNA vac Low Risk Adult (1 of 2 - PCV13) 02/25/2017 (Originally 01/04/1989)  . HEMOGLOBIN A1C  07/07/2016  . OPHTHALMOLOGY EXAM  01/04/2017   No flowsheet data found. Functional Status Survey:    Vitals:   04/26/16 1011  BP: 128/62  Pulse: 74  Resp: 18  Temp: 97.3 F (36.3 C)  TempSrc: Oral  SpO2: 98%  Weight: 223 lb 6.4 oz (101.3 kg)  Height: 5\' 9"  (1.753 m)   Body mass index is 32.99 kg/m. Physical Exam   In general this is a pleasant elderly female in no distress l sitting comfortably in her wheelchair  Her skin is warm and dry.  Eyes pupils appear reactive light visual acuity appears grossly intact she does have arcus senilis.  Oropharynx clear mucous membranes moist  Chest is clear to auscultation there is no labored breathing.  Heart is regular rate and rhythm without murmur gallop or rub --has relatively baseline lower extremity edema possibly slightly increased she has her legs in a dependent position today which I suspect contribute to it  a bit more  Edema  on the left versus the right--pedal pulses appear are palpable  Abdomen is obese soft nontender with positive bowel sounds.  Musculoskeletal is able to move all extremities 4 ambulates in a wheelchair has arthritic changes of her hands bilaterally strength appears to be intact grip strength is strong bilaterally.  Neurologic is grossly intact her speech is clear no lateralizing findings.  Psych she is oriented to self Place I would classify dementia as moderate is conversational laughing in good spirits today  Labs reviewed:  Recent Labs  09/17/15 0740 01/07/16 0500 03/01/16 0700  NA 140 140 140  K 4.3 4.3 4.2  CL 103 104 104  CO2 29 32 30  GLUCOSE 111* 124* 138*  BUN 11 11 10   CREATININE 0.93 0.94 0.85  CALCIUM 8.8* 8.7* 8.6*    Recent Labs  06/18/15 1618 08/06/15 0720 01/07/16 0500  AST 34 14* 13*  ALT 20 8* 9*  ALKPHOS 73 59 58  BILITOT 0.5 0.3 0.5  PROT 7.5 6.2* 6.4*  ALBUMIN 3.2* 3.0* 3.2*    Recent Labs  06/14/15 1800  06/16/15 1610 06/18/15 1618 10/14/15 0700 01/07/16 0500  WBC 8.7  < > 7.0 7.3 4.5 5.4  NEUTROABS 5.6  --  4.3 5.0  --   --   HGB 12.9  --  11.8* 12.5 11.4* 11.5*  HCT 41.6  --  38.1 41.8 37.5 37.8  MCV 80.5  --  80.2 81.6 82.8 82.7  PLT 257  --  281 256 189 216  < > = values in this interval not displayed. Lab Results  Component Value Date   TSH 1.117 01/07/2016   Lab Results  Component Value Date   HGBA1C 7.1 (H) 01/07/2016   Lab Results  Component Value Date   CHOL 155 03/01/2016   HDL 39 (L) 03/01/2016   LDLCALC 105 (H) 03/01/2016   TRIG 56 03/01/2016   CHOLHDL 4.0 03/01/2016    Significant Diagnostic Results in last 30 days:  No results found.  Assessment/Plan   #1 history of dementia she appears to be doing well with supportive care she is on Aricept and Namenda at this point will monitor.  #2 history of diabetes type 2 she is on Glucophage CBGs appear to be fairly satisfactory hemoglobin  A1c of 7.1 is satisfactory for patient's advanced age  and comorbidities-will update hemoglobin A1c.  #3 peripheral vascular disease this appears to be stable she is on Pletal as well as aspirin.  #4-history of hypothyroidism recent TSH was normal at 1.117 will update this as well as since it was done in October.  #5 history of hyperlipidemia lipid panel last month appear to be fairly satisfactory with cholesterol 155 LDL was 105 considering patient's advanced age this is satisfactory she is on a statin liver function tests were within normal limits back in October.  #6 history of vitamin D deficiency she is on supplementation this has normalized on most recent lab at 35.9 back in December 2017.  #7 history depression this is quite stable on Lexapro.  #8 history of neuropathy a suspect diabetic related she is doing well with the Neurontin.  #9 history of edema this appears somewhat increased when she is in a dependent position and she is in today-she has gained some weight but I feel this is probably more appetite related-will update a BNP however-also will update a venous Doppler of her left leg secondary to increased edema on the left versus the right   #10 Hypertension this appears stable on low dose lisinopril recent blood pressures 122/65-128/62   [id number]-of note greater than 40 minutes spent assessing patient-reviewing her chart-reviewing her labs-and coordinating formulating plan of care for numerous diagnoses-of note greater than 50% of time spent coordinating plan of care

## 2016-04-27 ENCOUNTER — Encounter (HOSPITAL_COMMUNITY)
Admission: RE | Admit: 2016-04-27 | Discharge: 2016-04-27 | Disposition: A | Discharge status: Home / Self Care | Payer: Medicare Other | Source: Skilled Nursing Facility | Attending: Internal Medicine | Admitting: Internal Medicine

## 2016-04-27 DIAGNOSIS — I10 Essential (primary) hypertension: Secondary | ICD-10-CM | POA: Insufficient documentation

## 2016-04-27 DIAGNOSIS — I739 Peripheral vascular disease, unspecified: Secondary | ICD-10-CM | POA: Insufficient documentation

## 2016-04-27 DIAGNOSIS — G629 Polyneuropathy, unspecified: Secondary | ICD-10-CM | POA: Diagnosis present

## 2016-04-27 DIAGNOSIS — K219 Gastro-esophageal reflux disease without esophagitis: Secondary | ICD-10-CM | POA: Diagnosis present

## 2016-04-27 DIAGNOSIS — F329 Major depressive disorder, single episode, unspecified: Secondary | ICD-10-CM | POA: Diagnosis present

## 2016-04-27 LAB — CBC WITH DIFFERENTIAL/PLATELET
BASOS PCT: 1 %
Basophils Absolute: 0 10*3/uL (ref 0.0–0.1)
EOS ABS: 0.2 10*3/uL (ref 0.0–0.7)
Eosinophils Relative: 5 %
HCT: 38.3 % (ref 36.0–46.0)
HEMOGLOBIN: 11.5 g/dL — AB (ref 12.0–15.0)
Lymphocytes Relative: 32 %
Lymphs Abs: 1.5 10*3/uL (ref 0.7–4.0)
MCH: 24.7 pg — ABNORMAL LOW (ref 26.0–34.0)
MCHC: 30 g/dL (ref 30.0–36.0)
MCV: 82.4 fL (ref 78.0–100.0)
MONOS PCT: 11 %
Monocytes Absolute: 0.5 10*3/uL (ref 0.1–1.0)
NEUTROS PCT: 51 %
Neutro Abs: 2.4 10*3/uL (ref 1.7–7.7)
PLATELETS: 217 10*3/uL (ref 150–400)
RBC: 4.65 MIL/uL (ref 3.87–5.11)
RDW: 14 % (ref 11.5–15.5)
WBC: 4.7 10*3/uL (ref 4.0–10.5)

## 2016-04-27 LAB — COMPREHENSIVE METABOLIC PANEL
ALT: 9 U/L — AB (ref 14–54)
ANION GAP: 7 (ref 5–15)
AST: 15 U/L (ref 15–41)
Albumin: 3.1 g/dL — ABNORMAL LOW (ref 3.5–5.0)
Alkaline Phosphatase: 56 U/L (ref 38–126)
BUN: 10 mg/dL (ref 6–20)
CHLORIDE: 103 mmol/L (ref 101–111)
CO2: 30 mmol/L (ref 22–32)
CREATININE: 0.93 mg/dL (ref 0.44–1.00)
Calcium: 8.5 mg/dL — ABNORMAL LOW (ref 8.9–10.3)
GFR calc non Af Amer: 52 mL/min — ABNORMAL LOW (ref >60)
GFR, EST AFRICAN AMERICAN: 60 mL/min — AB (ref >60)
Glucose, Bld: 130 mg/dL — ABNORMAL HIGH (ref 65–99)
POTASSIUM: 4.3 mmol/L (ref 3.5–5.1)
SODIUM: 140 mmol/L (ref 135–145)
Total Bilirubin: 0.4 mg/dL (ref 0.3–1.2)
Total Protein: 6.4 g/dL — ABNORMAL LOW (ref 6.5–8.1)

## 2016-04-27 LAB — TSH: TSH: 1.12 u[IU]/mL (ref 0.350–4.500)

## 2016-04-27 LAB — BRAIN NATRIURETIC PEPTIDE: B Natriuretic Peptide: 133 pg/mL — ABNORMAL HIGH (ref 0.0–100.0)

## 2016-04-28 LAB — HEMOGLOBIN A1C
Hgb A1c MFr Bld: 7.5 % — ABNORMAL HIGH (ref 4.8–5.6)
Mean Plasma Glucose: 169 mg/dL

## 2016-06-17 ENCOUNTER — Inpatient Hospital Stay
Admission: RE | Admit: 2016-06-17 | Discharge: 2017-09-27 | Disposition: A | Discharge status: Nursing Home (Includes ICF) | Payer: Medicare Other | Source: Ambulatory Visit | Attending: Internal Medicine | Admitting: Internal Medicine

## 2016-06-17 DIAGNOSIS — R634 Abnormal weight loss: Secondary | ICD-10-CM

## 2016-06-17 DIAGNOSIS — I824Y3 Acute embolism and thrombosis of unspecified deep veins of proximal lower extremity, bilateral: Principal | ICD-10-CM

## 2016-06-17 DIAGNOSIS — N289 Disorder of kidney and ureter, unspecified: Secondary | ICD-10-CM

## 2016-06-20 ENCOUNTER — Non-Acute Institutional Stay (SKILLED_NURSING_FACILITY): Payer: Medicare Other | Admitting: Internal Medicine

## 2016-06-20 ENCOUNTER — Encounter: Payer: Self-pay | Admitting: Internal Medicine

## 2016-06-20 DIAGNOSIS — E1151 Type 2 diabetes mellitus with diabetic peripheral angiopathy without gangrene: Secondary | ICD-10-CM

## 2016-06-20 DIAGNOSIS — E038 Other specified hypothyroidism: Secondary | ICD-10-CM | POA: Diagnosis not present

## 2016-06-20 DIAGNOSIS — G309 Alzheimer's disease, unspecified: Secondary | ICD-10-CM | POA: Diagnosis not present

## 2016-06-20 DIAGNOSIS — E782 Mixed hyperlipidemia: Secondary | ICD-10-CM | POA: Diagnosis not present

## 2016-06-20 DIAGNOSIS — F0280 Dementia in other diseases classified elsewhere without behavioral disturbance: Secondary | ICD-10-CM

## 2016-06-20 DIAGNOSIS — I10 Essential (primary) hypertension: Secondary | ICD-10-CM

## 2016-06-20 NOTE — Progress Notes (Signed)
Location:   [facility] Room Number: 149/W Place of Service:  SNF [phone]) Provider:  Darrick Meigs, MD  Patient Care Team: Fayrene Helper, MD as PCP - General  Extended Emergency Contact Information Primary Emergency Contact: Valarie Merino Address: [place], [place] Phone: [phone] Work Phone: [phone] Relation: Daughter  Code Status:  Full Code Goals of care: Advanced Directive information Advanced Directives 06/20/2016  Does Patient Have a Medical Advance Directive? Yes  Type of Advance Directive (No Data)  Does patient want to make changes to medical advance directive? No - Patient declined  Copy of [place] in Chart? -     Chief Complaint  Patient presents with  . Medical Management of Chronic Issues    Routine Visit    HPI:  Pt is a 83 y.o. female seen today for medical management of chronic diseases.     Patient has H/O  Dementia, Diabetes Type 2, Hypothyroidism, Peripheral vascular disease and Chronic Venous stasis, Hyperlipidemia  Patient has been doing well in facility. She did not have any new complains. Her appetite is good. Her weight has been slowly gaining weight and is now upto 223 lbs. That is gain of almost 6-7 lbs. Her BS are running less then 150 mostly. Her appetite is good. She denies any problem with her mood. She is sleeping well at night. Past Medical History:  Diagnosis Date  . Acute cystitis   . Dementia   . Dermatomycosis, unspecified   . DJD (degenerative joint disease)    Spine  . DM (diabetes mellitus) ([facility] [facility])   . GERD (gastroesophageal reflux disease)   . History of pulmonary embolism   . History of supraventricular tachycardia   . Hyperlipidemia   . Hypertension   . Hypothyroidism   . Long term (current) use of anticoagulants    coumadin  . Obesity   . Other malaise and fatigue   . PVD (peripheral  vascular disease) ([facility])    Past Surgical History:  Procedure Laterality Date  . ATRIAL ABLATION SURGERY  01/2003   Catheter, to tachic palpitations  . MASTECTOMY  2003   Left  . THYROIDECTOMY, PARTIAL  1962    Allergies  Allergen Reactions  . Contrast Media [Iodinated Diagnostic Agents] Other (See Comments)    Unknown-listed on MAR-patient is not aware of these allergies  . Latex Other (See Comments)    Unknown-listed on MAR-patient is not aware of these allergies    Allergies as of 06/20/2016      Reactions   Contrast Media [iodinated Diagnostic Agents] Other (See Comments)   Unknown-listed on MAR-patient is not aware of these allergies   Latex Other (See Comments)   Unknown-listed on MAR-patient is not aware of these allergies      Medication List       Accurate as of 06/20/16  2:17 PM. Always use your most recent med list.          acetaminophen 325 MG tablet Commonly known as:  TYLENOL Take 650 mg by mouth 2 (two) times daily.   aspirin 81 MG tablet Take 81 mg by mouth daily.   calcium-vitamin D 500-200 MG-UNIT tablet Commonly known as:  OSCAL WITH D Take 1 tablet by mouth 2 (two) times daily.   cholecalciferol 1000 units tablet Commonly known as:  VITAMIN D Take 1,000 Units by mouth 2 (two) times daily.  cilostazol 50 MG tablet Commonly known as:  PLETAL Take 50 mg by mouth 2 (two) times daily.   donepezil 10 MG tablet Commonly known as:  ARICEPT Take 10 mg by mouth every evening.   escitalopram 5 MG tablet Commonly known as:  LEXAPRO Take two tablets by mouth at bedtime for mood   furosemide 20 MG tablet Commonly known as:  LASIX Take 20 mg by mouth daily. Hold for systolic below BP below 762   gabapentin 300 MG capsule Commonly known as:  NEURONTIN Take 300 mg by mouth 2 (two) times daily.   levothyroxine 50 MCG tablet Commonly known as:  SYNTHROID, LEVOTHROID Take 50 mcg by mouth daily.   lisinopril 2.5 MG tablet Commonly known as:   PRINIVIL,ZESTRIL Take 2.5 mg by mouth daily.   memantine 10 MG tablet Commonly known as:  NAMENDA Take one tablet by mouth twice daily   metFORMIN 500 MG tablet Commonly known as:  GLUCOPHAGE Take 500 mg by mouth 2 (two) times daily with a meal.   omeprazole 20 MG capsule Commonly known as:  PRILOSEC Take 20 mg by mouth at bedtime.   pravastatin 80 MG tablet Commonly known as:  PRAVACHOL Take 80 mg by mouth at bedtime.   ROBITUSSIN COUGH+CHEST CONG DM PO Take 15 mLs by mouth every 6 (six) hours as needed.       Review of Systems  Review of Systems  Constitutional: Negative for activity change, appetite change, chills, diaphoresis, fatigue and fever.  HENT: Negative for mouth sores, postnasal drip, rhinorrhea, sinus pain and sore throat.   Respiratory: Negative for apnea, cough, chest tightness, shortness of breath and wheezing.   Cardiovascular: Negative for chest pain, palpitations and leg swelling.  Gastrointestinal: Negative for abdominal distention, abdominal pain, constipation, diarrhea, nausea and vomiting.  Genitourinary: Negative for dysuria and frequency.  Musculoskeletal: Negative for arthralgias, joint swelling and myalgias.  Skin: Negative for rash.  Neurological: Negative for dizziness, syncope, weakness, light-headedness and numbness.  Psychiatric/Behavioral: Negative for behavioral problems, confusion and sleep disturbance.     Immunization History  Administered Date(s) Administered  . Influenza Whole 10/11/2006  . Influenza-Unspecified 03/19/2009  . Td 01/14/2003   Pertinent  Health Maintenance Due  Topic Date Due  . INFLUENZA VACCINE  06/25/2016 (Originally 10/27/2015)  . FOOT EXAM  11/25/2016 (Originally 01/04/1932)  . DEXA SCAN  11/25/2016 (Originally 01/04/1989)  . PNA vac Low Risk Adult (1 of 2 - PCV13) 02/25/2017 (Originally 01/04/1989)  . HEMOGLOBIN A1C  10/25/2016  . OPHTHALMOLOGY EXAM  01/04/2017   No flowsheet data found. Functional  Status Survey:    Vitals:   06/20/16 1400  BP: 137/90  Pulse: 84   There is no height or weight on file to calculate BMI. Physical Exam  Constitutional: She appears well-developed and well-nourished.  HENT:  Head: Normocephalic.  Mouth/Throat: Oropharynx is clear and moist.  Eyes: Pupils are equal, round, and reactive to light.  Neck: Neck supple.  Cardiovascular: Normal rate, regular rhythm and normal heart sounds.   Pulmonary/Chest: Effort normal and breath sounds normal. No respiratory distress. She has no wheezes. She has no rales.  Abdominal: Soft. Bowel sounds are normal. She exhibits no distension. There is no tenderness. There is no rebound.  Musculoskeletal:  Trace edema B/L    Labs reviewed:  Recent Labs  01/07/16 0500 03/01/16 0700 04/27/16 0730  NA 140 140 140  K 4.3 4.2 4.3  CL 104 104 103  CO2 32 30 30  GLUCOSE 124*  138* 130*  BUN 11 10 10   CREATININE 0.94 0.85 0.93  CALCIUM 8.7* 8.6* 8.5*    Recent Labs  08/06/15 0720 01/07/16 0500 04/27/16 0730  AST 14* 13* 15  ALT 8* 9* 9*  ALKPHOS 59 58 56  BILITOT 0.3 0.5 0.4  PROT 6.2* 6.4* 6.4*  ALBUMIN 3.0* 3.2* 3.1*    Recent Labs  10/14/15 0700 01/07/16 0500 04/27/16 0730  WBC 4.5 5.4 4.7  NEUTROABS  --   --  2.4  HGB 11.4* 11.5* 11.5*  HCT 37.5 37.8 38.3  MCV 82.8 82.7 82.4  PLT 189 216 217   Lab Results  Component Value Date   TSH 1.120 04/27/2016   Lab Results  Component Value Date   HGBA1C 7.5 (H) 04/27/2016   Lab Results  Component Value Date   CHOL 155 03/01/2016   HDL 39 (L) 03/01/2016   LDLCALC 105 (H) 03/01/2016   TRIG 56 03/01/2016   CHOLHDL 4.0 03/01/2016    Significant Diagnostic Results in last 30 days:  No results found.  Assessment/Plan  Diabetes mellitus  BS running less then150 mostly A1C is 7.5 in 01/18 Continue same dose of metformin.  Essential hypertension BP well controlled. Continue low dose of lisinopril  Peripheral vascular disease  (HCC) Stable on aspirin and Pletal   Hypothyroidism TSH is Normal. In 01/18  Alzheimer's dementia  Patient stable on Aricept and nemenda  Mixed hyperlipidemia Continue Pravastatin. LDL is 105. But with her age would not be too aggressive.   Low Vitamin D Levels normal  Family/ staff Communication:   Labs/tests ordered:

## 2016-07-06 ENCOUNTER — Encounter (HOSPITAL_COMMUNITY)
Admission: RE | Admit: 2016-07-06 | Discharge: 2016-07-06 | Disposition: A | Discharge status: Home / Self Care | Payer: Medicare Other | Source: Skilled Nursing Facility | Attending: Internal Medicine | Admitting: Internal Medicine

## 2016-07-06 DIAGNOSIS — F329 Major depressive disorder, single episode, unspecified: Secondary | ICD-10-CM | POA: Diagnosis present

## 2016-07-06 DIAGNOSIS — K219 Gastro-esophageal reflux disease without esophagitis: Secondary | ICD-10-CM | POA: Diagnosis not present

## 2016-07-06 DIAGNOSIS — G629 Polyneuropathy, unspecified: Secondary | ICD-10-CM | POA: Insufficient documentation

## 2016-07-06 DIAGNOSIS — I739 Peripheral vascular disease, unspecified: Secondary | ICD-10-CM | POA: Diagnosis not present

## 2016-07-06 DIAGNOSIS — I10 Essential (primary) hypertension: Secondary | ICD-10-CM | POA: Insufficient documentation

## 2016-07-06 LAB — VITAMIN B12: Vitamin B-12: 360 pg/mL (ref 180–914)

## 2016-08-01 ENCOUNTER — Non-Acute Institutional Stay (SKILLED_NURSING_FACILITY): Payer: Medicare Other | Admitting: Internal Medicine

## 2016-08-01 ENCOUNTER — Encounter: Payer: Self-pay | Admitting: Internal Medicine

## 2016-08-01 DIAGNOSIS — I878 Other specified disorders of veins: Secondary | ICD-10-CM | POA: Diagnosis not present

## 2016-08-01 DIAGNOSIS — E038 Other specified hypothyroidism: Secondary | ICD-10-CM | POA: Diagnosis not present

## 2016-08-01 DIAGNOSIS — E1151 Type 2 diabetes mellitus with diabetic peripheral angiopathy without gangrene: Secondary | ICD-10-CM | POA: Diagnosis not present

## 2016-08-01 DIAGNOSIS — E782 Mixed hyperlipidemia: Secondary | ICD-10-CM | POA: Diagnosis not present

## 2016-08-01 DIAGNOSIS — I10 Essential (primary) hypertension: Secondary | ICD-10-CM | POA: Diagnosis not present

## 2016-08-01 DIAGNOSIS — F0280 Dementia in other diseases classified elsewhere without behavioral disturbance: Secondary | ICD-10-CM | POA: Diagnosis not present

## 2016-08-01 DIAGNOSIS — G309 Alzheimer's disease, unspecified: Secondary | ICD-10-CM | POA: Diagnosis not present

## 2016-08-01 DIAGNOSIS — I739 Peripheral vascular disease, unspecified: Secondary | ICD-10-CM

## 2016-08-01 NOTE — Progress Notes (Signed)
Location:   [facility] Room Number: 149/W Place of Service:  SNF [phone]) Provider:  Tera Helper, MD  Patient Care Team: Fayrene Helper, MD as PCP - General  Extended Emergency Contact Information Primary Emergency Contact: Valarie Merino Address: [place], [place] Phone: [phone] Work Phone: [phone] Relation: Daughter  Code Status:  Full Code Goals of care: Advanced Directive information Advanced Directives 08/01/2016  Does Patient Have a Medical Advance Directive? Yes  Type of Advance Directive (No Data)  Does patient want to make changes to medical advance directive? No - Patient declined  Copy of [place] in Chart? -     Chief Complaint  Patient presents with  . Medical Management of Chronic Issues    Routine Visit  For medical management of chronic medical conditions including dementia-diabetes type 2-hypothyroidism-peripheral vascular disease-venous stasis-hyperlipidemia-depression-neuropathy-hypertension  HPI:  Pt is a 83 y.o. female seen today for medical management of chronic diseases.  As noted above She continues to have a period is stability her weight appears to have stabilized at around 225 pounds she has has some variability since October when it was 214 but at one point went up to-2:30-however since March it appears to have leveled off at around 223-25 her edema appears to be relatively baseline she does have a history of venous stasis is on low-dose Lasix.  Her appetite continues to be pretty good.  She does have a history diabetes type 2 on Glucophage 500 mg twice a day blood sugars appeared average slightly less than t 150 last hemoglobin A1c back in late January was 7.5 at this point will update would be has not be real aggressive appear with her advanced age.  In regards to dementia she continues on Aricept and Namenda she  appears to be stable and doing quite well with supportive care at one point she had significant depression was started on Lexapro one appears to have responded very well to this she is on low-dose Lexapro continues to be baseline quite vocal and animated.  She denies any problems this evening appears to be in good spirits vital signs appear to be stable .     Past Medical History:  Diagnosis Date  . Acute cystitis   . Dementia   . Dermatomycosis, unspecified   . DJD (degenerative joint disease)    Spine  . DM (diabetes mellitus) ([place])   . GERD (gastroesophageal reflux disease)   . History of pulmonary embolism   . History of supraventricular tachycardia   . Hyperlipidemia   . Hypertension   . Hypothyroidism   . Long term (current) use of anticoagulants    coumadin  . Obesity   . Other malaise and fatigue   . PVD (peripheral vascular disease) ([place])    Past Surgical History:  Procedure Laterality Date  . ATRIAL ABLATION SURGERY  01/2003   Catheter, to tachic palpitations  . MASTECTOMY  2003   Left  . THYROIDECTOMY, PARTIAL  1962    Allergies  Allergen Reactions  . Contrast Media [Iodinated Diagnostic Agents] Other (See Comments)    Unknown-listed on MAR-patient is not aware of these allergies  . Latex Other (See Comments)    Unknown-listed on MAR-patient is not aware of these allergies    Allergies as of 08/01/2016      Reactions   Contrast Media [iodinated Diagnostic Agents] Other (See Comments)  Unknown-listed on MAR-patient is not aware of these allergies   Latex Other (See Comments)   Unknown-listed on MAR-patient is not aware of these allergies      Medication List       Accurate as of 08/01/16  3:24 PM. Always use your most recent med list.          acetaminophen 325 MG tablet Commonly known as:  TYLENOL Take 650 mg by mouth 2 (two) times daily.   aspirin 81 MG tablet Take 81 mg by mouth daily.   calcium-vitamin D 500-200 MG-UNIT tablet Commonly  known as:  OSCAL WITH D Take 1 tablet by mouth 2 (two) times daily.   cholecalciferol 1000 units tablet Commonly known as:  VITAMIN D Take 1,000 Units by mouth 2 (two) times daily.   cilostazol 50 MG tablet Commonly known as:  PLETAL Take 50 mg by mouth 2 (two) times daily.   donepezil 10 MG tablet Commonly known as:  ARICEPT Take 10 mg by mouth every evening.   escitalopram 5 MG tablet Commonly known as:  LEXAPRO Take two tablets by mouth at bedtime for mood   furosemide 20 MG tablet Commonly known as:  LASIX Take 20 mg by mouth daily. Hold for systolic below BP below 829   gabapentin 300 MG capsule Commonly known as:  NEURONTIN Take 300 mg by mouth 2 (two) times daily.   levothyroxine 50 MCG tablet Commonly known as:  SYNTHROID, LEVOTHROID Take 50 mcg by mouth daily.   lisinopril 2.5 MG tablet Commonly known as:  PRINIVIL,ZESTRIL Take 2.5 mg by mouth daily.   memantine 10 MG tablet Commonly known as:  NAMENDA Take one tablet by mouth twice daily   metFORMIN 500 MG tablet Commonly known as:  GLUCOPHAGE Take 500 mg by mouth 2 (two) times daily with a meal.   omeprazole 20 MG capsule Commonly known as:  PRILOSEC Take 20 mg by mouth at bedtime.   pravastatin 80 MG tablet Commonly known as:  PRAVACHOL Take 80 mg by mouth at bedtime.       Review of Systems  General she complains of no fever chills weight appears to be stable apparently eats pretty well.  Skin does not complain of rashes or itching.  Head ears eyes nose mouth and throat no complaints of visual changes or sore throat.  Respiratory denies shortness breath or cough.  Cardiac denies chest pain has chronic venous stasis changes which appears stable on exam this evening.  GI is not complaining of any nausea vomiting diarrhea constipation or abdominal pain.  GU does not complain of dysuria.  Muscle skeletal at times has complained of leg pain in the past but is not complaining of any pain at  this time.  Neurologic is not complaining of dizziness headache or numbness does have a history of neuropathy most likely diabetic but this appears to have stabilized with the Neurontin.  Psych does have a history of depression and dementia but this appears stable doing very well with supportive care   Immunization History  Administered Date(s) Administered  . Influenza Whole 10/11/2006  . Influenza-Unspecified 03/19/2009  . Td 01/14/2003   Pertinent  Health Maintenance Due  Topic Date Due  . FOOT EXAM  11/25/2016 (Originally 01/04/1932)  . DEXA SCAN  11/25/2016 (Originally 01/04/1989)  . PNA vac Low Risk Adult (1 of 2 - PCV13) 02/25/2017 (Originally 01/04/1989)  . HEMOGLOBIN A1C  10/25/2016  . INFLUENZA VACCINE  10/26/2016  . OPHTHALMOLOGY EXAM  01/04/2017  No flowsheet data found. Functional Status Survey:    Vitals:   08/01/16 1523  BP: 128/73  Pulse: 72  Resp: 18  Temp: 97.9 F (36.6 C)  TempSrc: Oral  SpO2: 98%  Weight is stable at 225.2 pounds  Physical Exam  In general this is a pleasant elderly female in no distress lsitting comfortably in her wheelchair  Her skin is warm and dry.  Eyes pupils appear reactive light visual acuity appears grossly intact she does have arcus senilis.  Oropharynx clear mucous membranes moist  Chest is clear to auscultation there is no labored breathing.  Heart is regular rate and rhythm without murmur gallop or rub --has relatively baseline lower extremity edema  This appears somewhat improved from previous exam she does have her legs elevated which I suspect help  Abdomen is obese soft nontender with positive bowel sounds.  Musculoskeletal is able to move all extremities 4 ambulates in a wheelchair has arthritic changes of her hands bilaterally strength appears to be intact grip strength is strong bilaterally.  Neurologic is grossly intact her speech is clear no lateralizing findings.  Psych she is oriented to  self Place I would classify dementia as moderate is conversationallaughing in good spirits today by large is the way she normally presents when I see her     Labs reviewed:  Recent Labs  01/07/16 0500 03/01/16 0700 04/27/16 0730  NA 140 140 140  K 4.3 4.2 4.3  CL 104 104 103  CO2 32 30 30  GLUCOSE 124* 138* 130*  BUN 11 10 10   CREATININE 0.94 0.85 0.93  CALCIUM 8.7* 8.6* 8.5*    Recent Labs  08/06/15 0720 01/07/16 0500 04/27/16 0730  AST 14* 13* 15  ALT 8* 9* 9*  ALKPHOS 59 58 56  BILITOT 0.3 0.5 0.4  PROT 6.2* 6.4* 6.4*  ALBUMIN 3.0* 3.2* 3.1*    Recent Labs  10/14/15 0700 01/07/16 0500 04/27/16 0730  WBC 4.5 5.4 4.7  NEUTROABS  --   --  2.4  HGB 11.4* 11.5* 11.5*  HCT 37.5 37.8 38.3  MCV 82.8 82.7 82.4  PLT 189 216 217   Lab Results  Component Value Date   TSH 1.120 04/27/2016   Lab Results  Component Value Date   HGBA1C 7.5 (H) 04/27/2016   Lab Results  Component Value Date   CHOL 155 03/01/2016   HDL 39 (L) 03/01/2016   LDLCALC 105 (H) 03/01/2016   TRIG 56 03/01/2016   CHOLHDL 4.0 03/01/2016    Significant Diagnostic Results in last 30 days:  No results found.  Assessment/Plan   #1 history of dementia this appears stable weight appears to be stable she continues to be in good spirits she is on Aricept and Namenda.  #2 history diabetes type 2 she is on Glucophage CBGs appear to average somewhat below 150 hemoglobin A1c showed some mild elevation at 7.5 from previous level-will update a hemoglobin A1c would be hesitant to be real aggressive here with her advanced age and relative stability.  #3 peripheral vascular disease appears stable pain appears to be controlled she is on Pletalas well as aspirin  #4 history hyperlipidemia lipid panel in December 2017 showed an LDL the 105 considering her advanced age this is satisfactory she is on a statin liver function tests have been within normal limits largely on most recent lab done in late  January 2018.   #5 depression this appears stable as well on low-dose Lexapro would be hesitant to  discontinue this secondary to stability and the significant improvement she showed once a was started.  #6 history of vitamin D deficiency this has normalized on supplementation last vitamin D level was 35.9.  #7 history neuropathy this appears to have stabilized on the Neurontin at this point will continue.  #8 history of venous stasis she continues on low-dose Lasix weight appears to be stable edema appears to be stable will update a metabolic panel to ensure stability of electrolytes-when I saw her back in January did order a venous Doppler of her leg left had increased edema but this was negative for any acute process.  Also updated  BNP which was minimally elevated at 133  #9 history hypertension this appears stable on lisinopril low-dose as well as Lasix blood pressure today is satisfactory at 128/73.  #10 anemia this has been stable with hemoglobin in the 11-12 range will update this for now updated value.   Of note again will update a CBC BMP and hemoglobin A1c clinically she appears to be quite stable and at her baseline   [id number]-of note greater than 35 minutes spent assessing patient-reviewing her chart-reviewing her labs-and coordinating formulating a plan of care for numerous diagnoses-of note greater than 50% of time spent coordinating plan of care

## 2016-08-02 ENCOUNTER — Encounter (HOSPITAL_COMMUNITY)
Admission: RE | Admit: 2016-08-02 | Discharge: 2016-08-02 | Disposition: A | Discharge status: Home / Self Care | Payer: Medicare Other | Source: Skilled Nursing Facility | Attending: Internal Medicine | Admitting: Internal Medicine

## 2016-08-02 DIAGNOSIS — G629 Polyneuropathy, unspecified: Secondary | ICD-10-CM | POA: Insufficient documentation

## 2016-08-02 DIAGNOSIS — I10 Essential (primary) hypertension: Secondary | ICD-10-CM | POA: Insufficient documentation

## 2016-08-02 DIAGNOSIS — K219 Gastro-esophageal reflux disease without esophagitis: Secondary | ICD-10-CM | POA: Insufficient documentation

## 2016-08-02 DIAGNOSIS — D649 Anemia, unspecified: Secondary | ICD-10-CM | POA: Diagnosis not present

## 2016-08-02 DIAGNOSIS — I739 Peripheral vascular disease, unspecified: Secondary | ICD-10-CM | POA: Insufficient documentation

## 2016-08-02 DIAGNOSIS — G4700 Insomnia, unspecified: Secondary | ICD-10-CM | POA: Diagnosis present

## 2016-08-02 DIAGNOSIS — F329 Major depressive disorder, single episode, unspecified: Secondary | ICD-10-CM | POA: Insufficient documentation

## 2016-08-02 LAB — CBC WITH DIFFERENTIAL/PLATELET
BASOS PCT: 0 %
Basophils Absolute: 0 10*3/uL (ref 0.0–0.1)
EOS ABS: 0.1 10*3/uL (ref 0.0–0.7)
Eosinophils Relative: 3 %
HEMATOCRIT: 40.3 % (ref 36.0–46.0)
HEMOGLOBIN: 11.7 g/dL — AB (ref 12.0–15.0)
Lymphocytes Relative: 29 %
Lymphs Abs: 1.4 10*3/uL (ref 0.7–4.0)
MCH: 24.2 pg — ABNORMAL LOW (ref 26.0–34.0)
MCHC: 29 g/dL — ABNORMAL LOW (ref 30.0–36.0)
MCV: 83.4 fL (ref 78.0–100.0)
MONO ABS: 0.4 10*3/uL (ref 0.1–1.0)
MONOS PCT: 8 %
NEUTROS PCT: 60 %
Neutro Abs: 2.8 10*3/uL (ref 1.7–7.7)
Platelets: 225 10*3/uL (ref 150–400)
RBC: 4.83 MIL/uL (ref 3.87–5.11)
RDW: 14.7 % (ref 11.5–15.5)
WBC: 4.8 10*3/uL (ref 4.0–10.5)

## 2016-08-02 LAB — BASIC METABOLIC PANEL
ANION GAP: 6 (ref 5–15)
BUN: 12 mg/dL (ref 6–20)
CALCIUM: 8.7 mg/dL — AB (ref 8.9–10.3)
CHLORIDE: 102 mmol/L (ref 101–111)
CO2: 33 mmol/L — AB (ref 22–32)
Creatinine, Ser: 1.02 mg/dL — ABNORMAL HIGH (ref 0.44–1.00)
GFR calc non Af Amer: 46 mL/min — ABNORMAL LOW (ref >60)
GFR, EST AFRICAN AMERICAN: 54 mL/min — AB (ref >60)
Glucose, Bld: 142 mg/dL — ABNORMAL HIGH (ref 65–99)
POTASSIUM: 4.4 mmol/L (ref 3.5–5.1)
Sodium: 141 mmol/L (ref 135–145)

## 2016-08-03 LAB — HEMOGLOBIN A1C
Hgb A1c MFr Bld: 7.2 % — ABNORMAL HIGH (ref 4.8–5.6)
MEAN PLASMA GLUCOSE: 160 mg/dL

## 2016-08-31 ENCOUNTER — Other Ambulatory Visit (HOSPITAL_COMMUNITY)
Admission: AD | Admit: 2016-08-31 | Discharge: 2016-08-31 | Disposition: A | Discharge status: Home / Self Care | Payer: Medicare Other | Source: Skilled Nursing Facility | Attending: Internal Medicine | Admitting: Internal Medicine

## 2016-08-31 ENCOUNTER — Non-Acute Institutional Stay (SKILLED_NURSING_FACILITY): Payer: Medicare Other | Admitting: Internal Medicine

## 2016-08-31 ENCOUNTER — Encounter: Payer: Self-pay | Admitting: Internal Medicine

## 2016-08-31 DIAGNOSIS — R0902 Hypoxemia: Secondary | ICD-10-CM

## 2016-08-31 DIAGNOSIS — I10 Essential (primary) hypertension: Secondary | ICD-10-CM | POA: Diagnosis present

## 2016-08-31 DIAGNOSIS — R059 Cough, unspecified: Secondary | ICD-10-CM

## 2016-08-31 DIAGNOSIS — J189 Pneumonia, unspecified organism: Secondary | ICD-10-CM | POA: Diagnosis not present

## 2016-08-31 DIAGNOSIS — R05 Cough: Secondary | ICD-10-CM | POA: Diagnosis not present

## 2016-08-31 DIAGNOSIS — R062 Wheezing: Secondary | ICD-10-CM

## 2016-08-31 LAB — COMPREHENSIVE METABOLIC PANEL
ALK PHOS: 64 U/L (ref 38–126)
ALT: 10 U/L — ABNORMAL LOW (ref 14–54)
AST: 16 U/L (ref 15–41)
Albumin: 3.3 g/dL — ABNORMAL LOW (ref 3.5–5.0)
Anion gap: 6 (ref 5–15)
BILIRUBIN TOTAL: 0.3 mg/dL (ref 0.3–1.2)
BUN: 13 mg/dL (ref 6–20)
CALCIUM: 8.7 mg/dL — AB (ref 8.9–10.3)
CO2: 33 mmol/L — AB (ref 22–32)
Chloride: 101 mmol/L (ref 101–111)
Creatinine, Ser: 1.08 mg/dL — ABNORMAL HIGH (ref 0.44–1.00)
GFR, EST AFRICAN AMERICAN: 50 mL/min — AB (ref >60)
GFR, EST NON AFRICAN AMERICAN: 43 mL/min — AB (ref >60)
Glucose, Bld: 112 mg/dL — ABNORMAL HIGH (ref 65–99)
Potassium: 4.4 mmol/L (ref 3.5–5.1)
SODIUM: 140 mmol/L (ref 135–145)
TOTAL PROTEIN: 6.7 g/dL (ref 6.5–8.1)

## 2016-08-31 LAB — CBC WITH DIFFERENTIAL/PLATELET
BASOS PCT: 1 %
Basophils Absolute: 0 10*3/uL (ref 0.0–0.1)
Eosinophils Absolute: 0.2 10*3/uL (ref 0.0–0.7)
Eosinophils Relative: 3 %
HEMATOCRIT: 37.1 % (ref 36.0–46.0)
HEMOGLOBIN: 11 g/dL — AB (ref 12.0–15.0)
LYMPHS ABS: 1.4 10*3/uL (ref 0.7–4.0)
Lymphocytes Relative: 33 %
MCH: 24.4 pg — AB (ref 26.0–34.0)
MCHC: 29.6 g/dL — AB (ref 30.0–36.0)
MCV: 82.3 fL (ref 78.0–100.0)
MONO ABS: 0.4 10*3/uL (ref 0.1–1.0)
MONOS PCT: 9 %
NEUTROS ABS: 2.4 10*3/uL (ref 1.7–7.7)
NEUTROS PCT: 54 %
Platelets: 230 10*3/uL (ref 150–400)
RBC: 4.51 MIL/uL (ref 3.87–5.11)
RDW: 14.6 % (ref 11.5–15.5)
WBC: 4.4 10*3/uL (ref 4.0–10.5)

## 2016-08-31 LAB — TSH: TSH: 1.175 u[IU]/mL (ref 0.350–4.500)

## 2016-08-31 LAB — BRAIN NATRIURETIC PEPTIDE: B NATRIURETIC PEPTIDE 5: 117 pg/mL — AB (ref 0.0–100.0)

## 2016-08-31 NOTE — Progress Notes (Signed)
Location:   [facility] Room Number: 149/W Place of Service:  SNF [phone]) Provider:  Tera Helper, MD  Patient Care Team: Fayrene Helper, MD as PCP - General  Extended Emergency Contact Information Primary Emergency Contact: Valarie Merino Address: [place], [place] Phone: [phone] Work Phone: [phone] Relation: Daughter  Code Status:  Full Code Goals of care: Advanced Directive information Advanced Directives 08/31/2016  Does Patient Have a Medical Advance Directive? Yes  Type of Advance Directive (No Data)  Does patient want to make changes to medical advance directive? No - Patient declined  Copy of [place] in Chart? -     Chief Complaint  Patient presents with  . Acute Visit    Wheezing    As well as hypoxia HPI:  Pt is a 83 y.o. female seen today for an acute visit for some wheezing along with decreased oxygen saturation in the low 80s.  Patient is usually quite stable has been a long-term resident of the facility with a history of dementia-diabetes type 2-hypothyroidism-peripheral vascular disease venous stasis depression and hypertension.  Apparently this morning she was thought to have some wheezing O2 stats were taken which were in the low 80s-she was put on oxygen 2 L and O2 sats rose to the low 90s.  She is not complaining of any shortness of breath apparently has had a cough at times however she denies any chest pain only complaint is she feels somewhat weak- otherwise she feels fine.  She is afebrile.       Past Medical History:  Diagnosis Date  . Acute cystitis   . Dementia   . Dermatomycosis, unspecified   . DJD (degenerative joint disease)    Spine  . DM (diabetes mellitus) ([place])   . GERD (gastroesophageal reflux disease)   . History of pulmonary embolism   . History of supraventricular tachycardia   .  Hyperlipidemia   . Hypertension   . Hypothyroidism   . Long term (current) use of anticoagulants    coumadin  . Obesity   . Other malaise and fatigue   . PVD (peripheral vascular disease) ([place])    Past Surgical History:  Procedure Laterality Date  . ATRIAL ABLATION SURGERY  01/2003   Catheter, to tachic palpitations  . MASTECTOMY  2003   Left  . THYROIDECTOMY, PARTIAL  1962    Allergies  Allergen Reactions  . Contrast Media [Iodinated Diagnostic Agents] Other (See Comments)    Unknown-listed on MAR-patient is not aware of these allergies  . Latex Other (See Comments)    Unknown-listed on MAR-patient is not aware of these allergies    Outpatient Encounter Prescriptions as of 08/31/2016  Medication Sig  . acetaminophen (TYLENOL) 325 MG tablet Take 650 mg by mouth 2 (two) times daily.  Marland Kitchen aspirin 81 MG tablet Take 81 mg by mouth daily.   . calcium-vitamin D (OSCAL WITH D) 500-200 MG-UNIT per tablet Take 1 tablet by mouth 2 (two) times daily.   . cholecalciferol (VITAMIN D) 1000 units tablet Take 1,000 Units by mouth 2 (two) times daily.   . cilostazol (PLETAL) 50 MG tablet Take 50 mg by mouth 2 (two) times daily.    Marland Kitchen donepezil (ARICEPT) 10 MG tablet Take 10 mg by mouth every evening.   . escitalopram (LEXAPRO) 5 MG tablet Take two tablets by mouth at bedtime  for mood  . furosemide (LASIX) 20 MG tablet Take 20 mg by mouth daily. Hold for systolic below BP below 268  . gabapentin (NEURONTIN) 300 MG capsule Take 300 mg by mouth 2 (two) times daily.    Marland Kitchen levothyroxine (SYNTHROID, LEVOTHROID) 50 MCG tablet Take 50 mcg by mouth daily.    Marland Kitchen lisinopril (PRINIVIL,ZESTRIL) 2.5 MG tablet Take 2.5 mg by mouth daily.  . memantine (NAMENDA) 10 MG tablet Take one tablet by mouth twice daily  . metFORMIN (GLUCOPHAGE) 500 MG tablet Take 500 mg by mouth 2 (two) times daily with a meal.    . omeprazole (PRILOSEC) 20 MG capsule Take 20 mg by mouth at bedtime.    . pravastatin (PRAVACHOL) 80 MG  tablet Take 80 mg by mouth at bedtime.     No facility-administered encounter medications on file as of 08/31/2016.     Review of Systems   Gen. she is not complaining of any fever or chills says she feels somewhat weak.  Skin does not complain of rashes itching or diaphoresis.  It appears eyes nose mouth and throat does not complain visual changes I drainage or nasal drainage or sore throat.  Respiratory says she is not really having any shortness of breath but was noted to be hypoxic with wheezing apparently has had some history of cough.  Cardiac does not complaining of chest pain does have some chronic lower extremity edema venous stasis changes.  GI is not complaining of any abdominal discomfort nausea vomiting diarrhea constipation.  GU denies dysuria.  Muscle skeletal says she generally feels somewhat weak but does not complaining of joint pain currently.  Neurologic other than the weakness has no complaints does not complain of syncope or dizziness or headache.  Psych does appear to have some mild dementia but appears to be in good spirits and has her baseline talkative personality  Immunization History  Administered Date(s) Administered  . Influenza Whole 10/11/2006  . Influenza-Unspecified 03/19/2009  . Td 01/14/2003   Pertinent  Health Maintenance Due  Topic Date Due  . URINE MICROALBUMIN  03/14/2009  . FOOT EXAM  11/25/2016 (Originally 01/04/1932)  . DEXA SCAN  11/25/2016 (Originally 01/04/1989)  . PNA vac Low Risk Adult (1 of 2 - PCV13) 02/25/2017 (Originally 01/04/1989)  . INFLUENZA VACCINE  10/26/2016  . OPHTHALMOLOGY EXAM  01/04/2017  . HEMOGLOBIN A1C  02/02/2017   No flowsheet data found. Functional Status Survey:     Temperature 97.1 pulse 71 respirations 18 blood pressure 131/67 O2 saturation is 91% on 2 L currently.   Physical Exam In general this is a pleasant elderly female in no distress l lying comfortably in bed she does have oxygen  applied  Her skin is warm and dry.  Eyes pupils appear reactive light visual acuity appears grossly intact she does have arcus senilis.  Oropharynx clear mucous membranes moist  Chest there is no labored breathing she speaks in full sentences-there is some expiratory wheezing right-sided lung field greater than the left  Heart is regular rate and rhythm without murmur gallop or rub --has relatively baseline lower extremity edema     Abdomen is obese soft nontender with positive bowel sounds.  Musculoskeletal is able to move all extremities has arthritic changes of her hands bilaterally strength appears to be intact grip strength is strong bilaterally.  Neurologic is grossly intact her speech is clear no lateralizing findings.  Psych  She is pleasant and appropriate has her baseline personality has mild dementia but  is largely appropriate and at her baseline  Labs reviewed:  Recent Labs  03/01/16 0700 04/27/16 0730 08/02/16 0700  NA 140 140 141  K 4.2 4.3 4.4  CL 104 103 102  CO2 30 30 33*  GLUCOSE 138* 130* 142*  BUN 10 10 12   CREATININE 0.85 0.93 1.02*  CALCIUM 8.6* 8.5* 8.7*    Recent Labs  01/07/16 0500 04/27/16 0730  AST 13* 15  ALT 9* 9*  ALKPHOS 58 56  BILITOT 0.5 0.4  PROT 6.4* 6.4*  ALBUMIN 3.2* 3.1*    Recent Labs  01/07/16 0500 04/27/16 0730 08/02/16 0700  WBC 5.4 4.7 4.8  NEUTROABS  --  2.4 2.8  HGB 11.5* 11.5* 11.7*  HCT 37.8 38.3 40.3  MCV 82.7 82.4 83.4  PLT 216 217 225   Lab Results  Component Value Date   TSH 1.120 04/27/2016   Lab Results  Component Value Date   HGBA1C 7.2 (H) 08/02/2016   Lab Results  Component Value Date   CHOL 155 03/01/2016   HDL 39 (L) 03/01/2016   LDLCALC 105 (H) 03/01/2016   TRIG 56 03/01/2016   CHOLHDL 4.0 03/01/2016    Significant Diagnostic Results in last 30 days:  No results found.  Assessment/Plan  #1-wheezing with hypoxia-will obtain a two-view chest x-ray-also will start  duo nebs every 6 hours routine for 48 hours then when necessary-also will add Mucinex 600 mg twice a day for 7 days.  We'll start a short prednisone taper at 40 mg for 2 days and reduce 10 mg every 2 days until discontinued.  Also monitor closely vital signs pulse ox every 4 hours 3 and then every shift.  Also will obtain an updated CBC with differential BMP BNP and TSH.   Addendum we have obtained an updated x-ray which just showed mild bilateral densities compatible with pneumonia-her lab work looks unremarkable white count is normal at 4.4 hemoglobin stable at 11.0-electrolytes appear normal with a sodium 140 potassium 4.4 CO2 level is 33 which is comparable with previous levels creatinine is 1.08 BUN of 13-area  Her BNP was relatively unremarkable at 117.  TSH was normal at 1.175.  I in regards to suspected pneumonia we will treat with Augmentin 500 mg twice a day for 7 days-also will treat with probiotic 2  times a day for 10 days.  I did reevaluate her later in the day she appeared to be stable her wheezing actually had improved somewhat there was no labored breathing otherwise physical exam was relatively unchanged.  She does not appear to be in any distress is not complaining of any shortness of breath or chest pain but does appear a bit more weak than I'm used to seeing her.      [id number]--of note greater than 45 minutes spent assessing patient-reassessing patient-discussing her status with staff-reviewing her chart-reviewing her labs-and coordinating formulating plan of care-no greater than 50% of time spent coordinating formulating plan of care and following up on results

## 2016-09-01 ENCOUNTER — Encounter: Payer: Self-pay | Admitting: Internal Medicine

## 2016-09-01 ENCOUNTER — Non-Acute Institutional Stay (SKILLED_NURSING_FACILITY): Payer: Medicare Other | Admitting: Internal Medicine

## 2016-09-01 DIAGNOSIS — J181 Lobar pneumonia, unspecified organism: Secondary | ICD-10-CM

## 2016-09-01 DIAGNOSIS — I10 Essential (primary) hypertension: Secondary | ICD-10-CM

## 2016-09-01 DIAGNOSIS — I739 Peripheral vascular disease, unspecified: Secondary | ICD-10-CM | POA: Diagnosis not present

## 2016-09-01 DIAGNOSIS — E1151 Type 2 diabetes mellitus with diabetic peripheral angiopathy without gangrene: Secondary | ICD-10-CM

## 2016-09-01 DIAGNOSIS — J189 Pneumonia, unspecified organism: Secondary | ICD-10-CM

## 2016-09-01 NOTE — Progress Notes (Signed)
Location:   [facility] Room Number: 149/W Place of Service:  SNF [phone]) Provider:  Thurmond Butts, MD  Patient Care Team: Fayrene Helper, MD as PCP - General  Extended Emergency Contact Information Primary Emergency Contact: Valarie Merino Address: [place], [place] Phone: [phone] Work Phone: [phone] Relation: Daughter  Code Status:  Full Code Goals of care: Advanced Directive information Advanced Directives 09/01/2016  Does Patient Have a Medical Advance Directive? Yes  Type of Advance Directive (No Data)  Does patient want to make changes to medical advance directive? No - Patient declined  Copy of [place] in Chart? -     Chief Complaint  Patient presents with  .       Marland Kitchen Acute Visit    F/U for pneumonia    HPI:  Pt is a 83 y.o. female seen today for Follow up of her Pneumonia  Patient has H/O Dementia, Diabetes Type 2, Hypothyroidism, Peripheral vascular disease and Chronic Venous stasis, Hyperlipidemia  She was seen by Nursing staff to be having wheezing and Hypoxia yesterday and had Chest xray which showed Bilateral infiltrate. She was started on Augmentin and low dose Prednisone. Patient did not have any fever or chills but did have cough with productive sputum. Her BNP was WNL. This was the follow up visit  Patient continues to feel weak and tired. She usually is up and very alert but today is in her bed and sleeping most of the time. Per Nurses she did eat good Lunch. Her Pox is staying over 90% on Oxygen supplement. Denies any fever , Chest pain , Chills. Does have cough. Not SOB.  Past Medical History:  Diagnosis Date  . Acute cystitis   . Dementia   . Dermatomycosis, unspecified   . DJD (degenerative joint disease)    Spine  . DM (diabetes mellitus) ([place])   . GERD (gastroesophageal reflux disease)   . History of pulmonary  embolism   . History of supraventricular tachycardia   . Hyperlipidemia   . Hypertension   . Hypothyroidism   . Long term (current) use of anticoagulants    coumadin  . Obesity   . Other malaise and fatigue   . PVD (peripheral vascular disease) ([place])    Past Surgical History:  Procedure Laterality Date  . ATRIAL ABLATION SURGERY  01/2003   Catheter, to tachic palpitations  . MASTECTOMY  2003   Left  . THYROIDECTOMY, PARTIAL  1962    Allergies  Allergen Reactions  . Contrast Media [Iodinated Diagnostic Agents] Other (See Comments)    Unknown-listed on MAR-patient is not aware of these allergies  . Latex Other (See Comments)    Unknown-listed on MAR-patient is not aware of these allergies    Outpatient Encounter Prescriptions as of 09/01/2016  Medication Sig  . acetaminophen (TYLENOL) 325 MG tablet Take 650 mg by mouth 2 (two) times daily.  Marland Kitchen amoxicillin-clavulanate (AUGMENTIN) 500-125 MG tablet Take 1 tablet by mouth 2 (two) times daily.  Marland Kitchen aspirin 81 MG tablet Take 81 mg by mouth daily.   . calcium-vitamin D (OSCAL WITH D) 500-200 MG-UNIT per tablet Take 1 tablet by mouth 2 (two) times daily.   . cholecalciferol (VITAMIN D) 1000 units tablet Take 1,000 Units by mouth 2 (two) times daily.   . cilostazol (PLETAL) 50 MG tablet Take 50 mg by mouth 2 (two)  times daily.    Marland Kitchen donepezil (ARICEPT) 10 MG tablet Take 10 mg by mouth every evening.   . escitalopram (LEXAPRO) 5 MG tablet Take two tablets by mouth at bedtime for mood  . furosemide (LASIX) 20 MG tablet Take 20 mg by mouth daily. Hold for systolic below BP below 854  . gabapentin (NEURONTIN) 300 MG capsule Take 300 mg by mouth 2 (two) times daily.    Marland Kitchen guaiFENesin (MUCINEX) 600 MG 12 hr tablet Take 600 mg by mouth 2 (two) times daily.  Marland Kitchen ipratropium-albuterol (DUONEB) 0.5-2.5 (3) MG/3ML SOLN Take 3 mLs by nebulization every 6 (six) hours as needed. May have 3 ml as needed  . levothyroxine (SYNTHROID, LEVOTHROID) 50 MCG tablet  Take 50 mcg by mouth daily.    Marland Kitchen lisinopril (PRINIVIL,ZESTRIL) 2.5 MG tablet Take 2.5 mg by mouth daily.  . memantine (NAMENDA) 10 MG tablet Take one tablet by mouth twice daily  . metFORMIN (GLUCOPHAGE) 500 MG tablet Take 500 mg by mouth 2 (two) times daily with a meal.    . omeprazole (PRILOSEC) 20 MG capsule Take 20 mg by mouth at bedtime.    . pravastatin (PRAVACHOL) 80 MG tablet Take 80 mg by mouth at bedtime.    . predniSONE (DELTASONE) 10 MG tablet Give  4 tablets ( 40 mg ) by mouth once a day from 09/01/2016-09/02/2016. Give 3 tablet (30 mg ) by mouth once a day from 6/9/018-09/04/2016. Give 2 tablets (20 mg) by mouth once a day from 09/05/2016-09/06/2016. Give 1 tablet by mouth ( 10 mg ) from 09/07/2016-09/08/2016.  . Probiotic Product (RISA-BID PROBIOTIC) TABS Give 1 tablet by mouth twice a day from 08/31/2016-09/09/2016   No facility-administered encounter medications on file as of 09/01/2016.      Review of Systems  Review of Systems  Constitutional: Negative for activity change, appetite change, chills, diaphoresis, fatigue and fever.  HENT: Negative for mouth sores, postnasal drip, rhinorrhea, sinus pain and sore throat.   Respiratory: Negative for apnea,  chest tightness, shortness of breath and wheezing.   Cardiovascular: Negative for chest pain, palpitations and leg swelling.  Gastrointestinal: Negative for abdominal distention, abdominal pain, constipation, diarrhea, nausea and vomiting.  Genitourinary: Negative for dysuria and frequency.  Musculoskeletal: Negative for arthralgias, joint swelling and myalgias.  Skin: Negative for rash.  Neurological: Negative for dizziness, syncope, weakness, light-headedness and numbness.  Psychiatric/Behavioral: Negative for behavioral problems, confusion and sleep disturbance.     Immunization History  Administered Date(s) Administered  . Influenza Whole 10/11/2006  . Influenza-Unspecified 03/19/2009  . Td 01/14/2003   Pertinent  Health  Maintenance Due  Topic Date Due  . URINE MICROALBUMIN  03/14/2009  . FOOT EXAM  11/25/2016 (Originally 01/04/1932)  . DEXA SCAN  11/25/2016 (Originally 01/04/1989)  . PNA vac Low Risk Adult (1 of 2 - PCV13) 02/25/2017 (Originally 01/04/1989)  . INFLUENZA VACCINE  10/26/2016  . OPHTHALMOLOGY EXAM  01/04/2017  . HEMOGLOBIN A1C  02/02/2017   No flowsheet data found. Functional Status Survey:    Vitals:   09/01/16 1424  BP: 131/64  Pulse: 72  Resp: 16  Temp: 98.9 F (37.2 C)  TempSrc: Oral   There is no height or weight on file to calculate BMI. Physical Exam  Constitutional: She appears well-developed and well-nourished.  HENT:  Head: Normocephalic.  Mouth/Throat: Oropharynx is clear and moist.  Eyes: Pupils are equal, round, and reactive to light.  Neck: Neck supple.  Cardiovascular: Normal rate, regular rhythm and normal heart sounds.  No murmur heard. Pulmonary/Chest: Effort normal and breath sounds normal. No respiratory distress. She has no wheezes. She has no rales.  Abdominal: Soft. Bowel sounds are normal. She exhibits no distension. There is no tenderness. There is no rebound.  Musculoskeletal: She exhibits no edema.  Neurological:  Was sleepy and looked tired in her bed.  Skin: Skin is warm and dry.  Psychiatric: She has a normal mood and affect. Her behavior is normal.    Labs reviewed:  Recent Labs  04/27/16 0730 08/02/16 0700 08/31/16 1300  NA 140 141 140  K 4.3 4.4 4.4  CL 103 102 101  CO2 30 33* 33*  GLUCOSE 130* 142* 112*  BUN 10 12 13   CREATININE 0.93 1.02* 1.08*  CALCIUM 8.5* 8.7* 8.7*    Recent Labs  01/07/16 0500 04/27/16 0730 08/31/16 1300  AST 13* 15 16  ALT 9* 9* 10*  ALKPHOS 58 56 64  BILITOT 0.5 0.4 0.3  PROT 6.4* 6.4* 6.7  ALBUMIN 3.2* 3.1* 3.3*    Recent Labs  04/27/16 0730 08/02/16 0700 08/31/16 1300  WBC 4.7 4.8 4.4  NEUTROABS 2.4 2.8 2.4  HGB 11.5* 11.7* 11.0*  HCT 38.3 40.3 37.1  MCV 82.4 83.4 82.3  PLT 217  225 230   Lab Results  Component Value Date   TSH 1.175 08/31/2016   Lab Results  Component Value Date   HGBA1C 7.2 (H) 08/02/2016   Lab Results  Component Value Date   CHOL 155 03/01/2016   HDL 39 (L) 03/01/2016   LDLCALC 105 (H) 03/01/2016   TRIG 56 03/01/2016   CHOLHDL 4.0 03/01/2016    Significant Diagnostic Results in last 30 days:  No results found.  Assessment/Plan Pneumonia with Bilateral infiltrate Will continue to monitor patient closely in facility Continue on Augmentin Also continue on Prednisone Low dose. Labs were reviewed and seem stable. Vitals and POX Q4 hours. Will repeat Labs tomorrow.  Diabetes BS running mostly stable on Prednisone. Essential hypertension BP well controlled. Continue low dose of lisinopril  Peripheral vascular disease (HCC) Stable on aspirin and Pletal    Family/ staff Communication:   Labs/tests ordered:  CBC, BMP

## 2016-09-02 ENCOUNTER — Encounter (HOSPITAL_COMMUNITY)
Admission: RE | Admit: 2016-09-02 | Discharge: 2016-09-02 | Disposition: A | Discharge status: Home / Self Care | Payer: Medicare Other | Source: Skilled Nursing Facility | Attending: Internal Medicine | Admitting: Internal Medicine

## 2016-09-02 DIAGNOSIS — K219 Gastro-esophageal reflux disease without esophagitis: Secondary | ICD-10-CM | POA: Insufficient documentation

## 2016-09-02 DIAGNOSIS — F329 Major depressive disorder, single episode, unspecified: Secondary | ICD-10-CM | POA: Diagnosis present

## 2016-09-02 DIAGNOSIS — G4700 Insomnia, unspecified: Secondary | ICD-10-CM | POA: Insufficient documentation

## 2016-09-02 DIAGNOSIS — G629 Polyneuropathy, unspecified: Secondary | ICD-10-CM | POA: Insufficient documentation

## 2016-09-02 DIAGNOSIS — I10 Essential (primary) hypertension: Secondary | ICD-10-CM | POA: Insufficient documentation

## 2016-09-02 DIAGNOSIS — I739 Peripheral vascular disease, unspecified: Secondary | ICD-10-CM | POA: Diagnosis present

## 2016-09-02 LAB — CBC WITH DIFFERENTIAL/PLATELET
Basophils Absolute: 0 10*3/uL (ref 0.0–0.1)
Basophils Relative: 0 %
Eosinophils Absolute: 0.1 10*3/uL (ref 0.0–0.7)
Eosinophils Relative: 2 %
HEMATOCRIT: 37.1 % (ref 36.0–46.0)
HEMOGLOBIN: 10.7 g/dL — AB (ref 12.0–15.0)
LYMPHS ABS: 1.7 10*3/uL (ref 0.7–4.0)
LYMPHS PCT: 32 %
MCH: 23.9 pg — AB (ref 26.0–34.0)
MCHC: 28.8 g/dL — ABNORMAL LOW (ref 30.0–36.0)
MCV: 82.8 fL (ref 78.0–100.0)
Monocytes Absolute: 0.5 10*3/uL (ref 0.1–1.0)
Monocytes Relative: 10 %
NEUTROS ABS: 2.9 10*3/uL (ref 1.7–7.7)
NEUTROS PCT: 56 %
Platelets: 212 10*3/uL (ref 150–400)
RBC: 4.48 MIL/uL (ref 3.87–5.11)
RDW: 14.5 % (ref 11.5–15.5)
WBC: 5.1 10*3/uL (ref 4.0–10.5)

## 2016-09-02 LAB — BASIC METABOLIC PANEL
Anion gap: 7 (ref 5–15)
BUN: 14 mg/dL (ref 6–20)
CHLORIDE: 100 mmol/L — AB (ref 101–111)
CO2: 33 mmol/L — AB (ref 22–32)
Calcium: 8.9 mg/dL (ref 8.9–10.3)
Creatinine, Ser: 1.05 mg/dL — ABNORMAL HIGH (ref 0.44–1.00)
GFR calc Af Amer: 52 mL/min — ABNORMAL LOW (ref >60)
GFR calc non Af Amer: 45 mL/min — ABNORMAL LOW (ref >60)
Glucose, Bld: 128 mg/dL — ABNORMAL HIGH (ref 65–99)
POTASSIUM: 4.3 mmol/L (ref 3.5–5.1)
Sodium: 140 mmol/L (ref 135–145)

## 2016-09-22 ENCOUNTER — Encounter: Payer: Self-pay | Admitting: Internal Medicine

## 2016-09-22 ENCOUNTER — Non-Acute Institutional Stay (SKILLED_NURSING_FACILITY): Payer: Medicare Other | Admitting: Internal Medicine

## 2016-09-22 DIAGNOSIS — E038 Other specified hypothyroidism: Secondary | ICD-10-CM | POA: Diagnosis not present

## 2016-09-22 DIAGNOSIS — E785 Hyperlipidemia, unspecified: Secondary | ICD-10-CM | POA: Diagnosis not present

## 2016-09-22 DIAGNOSIS — G309 Alzheimer's disease, unspecified: Secondary | ICD-10-CM

## 2016-09-22 DIAGNOSIS — I739 Peripheral vascular disease, unspecified: Secondary | ICD-10-CM

## 2016-09-22 DIAGNOSIS — F0280 Dementia in other diseases classified elsewhere without behavioral disturbance: Secondary | ICD-10-CM | POA: Diagnosis not present

## 2016-09-22 DIAGNOSIS — I10 Essential (primary) hypertension: Secondary | ICD-10-CM

## 2016-09-22 DIAGNOSIS — E1151 Type 2 diabetes mellitus with diabetic peripheral angiopathy without gangrene: Secondary | ICD-10-CM | POA: Diagnosis not present

## 2016-09-22 NOTE — Progress Notes (Signed)
Location:   [facility] Room Number: 149/W Place of Service:  SNF [phone]) Provider: Tera Helper, MD  Patient Care Team: Fayrene Helper, MD as PCP - General  Extended Emergency Contact Information Primary Emergency Contact: Valarie Merino Address: [place], [place] Phone: [phone] Work Phone: [phone] Relation: Daughter  Goals of care: Advanced Directive information Advanced Directives 09/22/2016  Does Patient Have a Medical Advance Directive? Yes  Type of Advance Directive (No Data)  Does patient want to make changes to medical advance directive? No - Patient declined  Copy of [place] in Chart? -     Chief Complaint  Patient presents with  . Medical Management of Chronic Issues    Routine Visit   Medical management of chronic medical conditions including dementia-diabetes type 2-peripheral vascular disease hyperlipidemia-depression-neuropathy-venous stasis changes-hypertension- HPI:  Pt is a 83 y.o. female seen today for medical management of chronic diseases. As noted above-she continues to be quite stable she has gained about 5 pounds over the past month she is eating very well in fact earlier this month she did have a bout of pneumonia but responded well to antibiotics and prednisone she is back at her baseline bright alert ambulating about facility in her wheelchair.  Appears to be feeling better.  She has no concerns today-nursing staff does not report any recent acute issues.  She does have a history of dementia and is on Aricept and Namenda appears to be doing well with supportive care has a very good appetite which I suspect is the reason for her recent weight gain.  She also has a history diabetes type 2 on Glucophage 5 mg twice a day CBGs are largely in the lower 100s hemoglobin A1c was 7.2 back in May 2018.  Regards to peripheral  vascular disease she is TO AND ASPIRIN.  SHE ALSO HAS COMPLAIN OF NEUROPATHIC PAIN IN HER LOWER EXTREMITIES HAS BEEN STARTED ON NEURONTIN AND APPARENTLY THIS IS HELPING.  SHE DOES HAVE A HISTORY OF CHRONIC LOWER EXTREMITY EDEMA VENOUS STASIS CHANGES BNPS HAVE BEEN UNREMARKABLE-SHE IS ON LOW-DOSE LASIX I DID NOTED TIMES HER SYSTOLIC BLOOD PRESSURE DROPS BELOW 100 AND FACT ON EXAM TONIGHT WAS 69 SHE IS ASYMPTOMATIC and has an order to hold her Lasix if systolic blood pressure is less than 100.  Regards to hypertension she has variable blood pressures ranging from 153/62-122/74 again occasionally has a systolic under 902 she is on low-dose lisinopril with her history of diabetes this is only 2.5 mg a day.  We'll write an order to hold this as well systolic blood pressures less than 100.  At one point she was thought to have significant depression but has responded very well to low-dose Lexapro.  Currently see is sitting in her wheelchair comfortably and again has no complaints   Past Medical History:  Diagnosis Date  . Acute cystitis   . Dementia   . Dermatomycosis, unspecified   . DJD (degenerative joint disease)    Spine  . DM (diabetes mellitus) ([place])   . GERD (gastroesophageal reflux disease)   . History of pulmonary embolism   . History of supraventricular tachycardia   . Hyperlipidemia   . Hypertension   . Hypothyroidism   . Long term (current) use of anticoagulants    coumadin  . Obesity   . Other malaise and fatigue   . PVD (peripheral  vascular disease) [facility])    Past Surgical History:  Procedure Laterality Date  . ATRIAL ABLATION SURGERY  01/2003   Catheter, to tachic palpitations  . MASTECTOMY  2003   Left  . THYROIDECTOMY, PARTIAL  1962    Allergies  Allergen Reactions  . Contrast Media [Iodinated Diagnostic Agents] Other (See Comments)    Unknown-listed on MAR-patient is not aware of these allergies  . Latex Other (See Comments)    Unknown-listed on  MAR-patient is not aware of these allergies    Outpatient Encounter Prescriptions as of 09/22/2016  Medication Sig  . acetaminophen (TYLENOL) 325 MG tablet Take 650 mg by mouth 2 (two) times daily.  Marland Kitchen aspirin 81 MG tablet Take 81 mg by mouth daily.   . calcium-vitamin D (OSCAL WITH D) 500-200 MG-UNIT per tablet Take 1 tablet by mouth 2 (two) times daily.   . cholecalciferol (VITAMIN D) 1000 units tablet Take 1,000 Units by mouth 2 (two) times daily.   . cilostazol (PLETAL) 50 MG tablet Take 50 mg by mouth 2 (two) times daily.    Marland Kitchen donepezil (ARICEPT) 10 MG tablet Take 10 mg by mouth every evening.   . escitalopram (LEXAPRO) 5 MG tablet Take two tablets by mouth at bedtime for mood  . furosemide (LASIX) 20 MG tablet Take 20 mg by mouth daily. Hold for systolic below BP below 616  . gabapentin (NEURONTIN) 300 MG capsule Take 300 mg by mouth 2 (two) times daily.    Marland Kitchen levothyroxine (SYNTHROID, LEVOTHROID) 50 MCG tablet Take 50 mcg by mouth daily.    Marland Kitchen lisinopril (PRINIVIL,ZESTRIL) 2.5 MG tablet Take 2.5 mg by mouth daily.  . memantine (NAMENDA) 10 MG tablet Take one tablet by mouth twice daily  . metFORMIN (GLUCOPHAGE) 500 MG tablet Take 500 mg by mouth 2 (two) times daily with a meal.    . omeprazole (PRILOSEC) 20 MG capsule Take 20 mg by mouth at bedtime.    . pravastatin (PRAVACHOL) 80 MG tablet Take 80 mg by mouth at bedtime.    . [DISCONTINUED] amoxicillin-clavulanate (AUGMENTIN) 500-125 MG tablet Take 1 tablet by mouth 2 (two) times daily.  . [DISCONTINUED] guaiFENesin (MUCINEX) 600 MG 12 hr tablet Take 600 mg by mouth 2 (two) times daily.  . [DISCONTINUED] ipratropium-albuterol (DUONEB) 0.5-2.5 (3) MG/3ML SOLN Take 3 mLs by nebulization every 6 (six) hours as needed. May have 3 ml as needed  . [DISCONTINUED] predniSONE (DELTASONE) 10 MG tablet Give  4 tablets ( 40 mg ) by mouth once a day from 09/01/2016-09/02/2016. Give 3 tablet (30 mg ) by mouth once a day from 6/9/018-09/04/2016. Give 2  tablets (20 mg) by mouth once a day from 09/05/2016-09/06/2016. Give 1 tablet by mouth ( 10 mg ) from 09/07/2016-09/08/2016.  . [DISCONTINUED] Probiotic Product (RISA-BID PROBIOTIC) TABS Give 1 tablet by mouth twice a day from 08/31/2016-09/09/2016   No facility-administered encounter medications on file as of 09/22/2016.      Review of Systems Gen. she is not complaining of any fever or chills  says she feels fine apparently is eating well  Skin does not complain of rashes itching or diaphoresis.  [place] nose mouth and throat does not complain visual changes I drainage or nasal drainage or sore throat.  Respiratory says she is not really having any shortness of breath  Or call from pneumonia appears to be resolved  Cardiac does not complaining of chest pain does have some chronic lower extremity edema venous stasis changes.  GI is  not complaining of any abdominal discomfort nausea vomiting diarrhea constipation.  GU denies dysuria.  Muscle skeletal does not complain of joint pain continues to ambulate about facility in her wheelchair  Neurologic --does not complain of syncope or dizziness or headache does not complaining of numbness is on Neurontin.  Psych does appear to have some mild dementia but appears to be in good spirits and has her baseline talkative personality Immunization History  Administered Date(s) Administered  . Influenza Whole 10/11/2006  . Influenza-Unspecified 03/19/2009  . Td 01/14/2003   Pertinent  Health Maintenance Due  Topic Date Due  . URINE MICROALBUMIN  03/14/2009  . FOOT EXAM  11/25/2016 (Originally 01/04/1932)  . DEXA SCAN  11/25/2016 (Originally 01/04/1989)  . PNA vac Low Risk Adult (1 of 2 - PCV13) 02/25/2017 (Originally 01/04/1989)  . INFLUENZA VACCINE  10/26/2016  . OPHTHALMOLOGY EXAM  01/04/2017  . HEMOGLOBIN A1C  02/02/2017   No flowsheet data found. Functional Status Survey:    Vitals:   09/22/16 1513  BP: (!) 153/62  Pulse:  79  Resp: 18  Temp: 97.6 F (36.4 C)  TempSrc: Oral  Weight: 230 lb 12.8 oz (104.7 kg)  Height: 5\' 9"  (1.753 m)  Manual blood pressure tonight was 98/50 Body mass index is 34.08 kg/m. Physical Exam Gen. this is a pleasant female in no distress sitting comfortably in her wheelchair she is bright alert talkative.  Her skin is warm and dry.  Eyes pupils appear reactive light visual acuity appears grossly intact sclera and conjunctivae are clear she does have Arcus senilis  Oropharynx is clear mucous membranes moist.  Chest is clear to auscultation there is no labored breathing.  Heart is regular rate and rhythm without murmur gallop rub she has chronic venous stasis changes which appears relatively baseline she has TED hose on.  Abdomen soft nontender with positive bowel sounds  Musculoskeletal is able to move all extremities 4 has arthritic changes were hands which is baseline is ambulatory in her wheelchair and appears to be doing very well with that.  Neurologic is grossly intact speech is clear no lateralizing findings.  Psych continues we pleasant and appropriate with baseline personality --out going she does have mild dementia but is largely appropriate Labs reviewed:  Recent Labs  08/02/16 0700 08/31/16 1300 09/02/16 0700  NA 141 140 140  K 4.4 4.4 4.3  CL 102 101 100*  CO2 33* 33* 33*  GLUCOSE 142* 112* 128*  BUN 12 13 14   CREATININE 1.02* 1.08* 1.05*  CALCIUM 8.7* 8.7* 8.9    Recent Labs  01/07/16 0500 04/27/16 0730 08/31/16 1300  AST 13* 15 16  ALT 9* 9* 10*  ALKPHOS 58 56 64  BILITOT 0.5 0.4 0.3  PROT 6.4* 6.4* 6.7  ALBUMIN 3.2* 3.1* 3.3*    Recent Labs  08/02/16 0700 08/31/16 1300 09/02/16 0700  WBC 4.8 4.4 5.1  NEUTROABS 2.8 2.4 2.9  HGB 11.7* 11.0* 10.7*  HCT 40.3 37.1 37.1  MCV 83.4 82.3 82.8  PLT 225 230 212   Lab Results  Component Value Date   TSH 1.175 08/31/2016   Lab Results  Component Value Date   HGBA1C 7.2 (H)  08/02/2016   Lab Results  Component Value Date   CHOL 155 03/01/2016   HDL 39 (L) 03/01/2016   LDLCALC 105 (H) 03/01/2016   TRIG 56 03/01/2016   CHOLHDL 4.0 03/01/2016    Significant Diagnostic Results in last 30 days:  No results found.  Assessment/Plan  #  1 history dementia this appears stable and supportive care she is on Aricept and Namenda has had some mild weight gain suspect this is appetite related BMP done earlier this month was unremarkable.  #2 diabetes type 2 this appears stable on Glucophage last hemoglobin A1c was 7.2 last month blood sugars largely in the lower 100s.  also is on low-dose lisinopril  #3 history of peripheral vascular disease this appears stable on Pletal and aspirin  #4-history of hyperlipidemia lipid panel December 2017 showed an LDL 105 she is on a statin at this point with her age and comorbidities somewhat nonaggressive here will continue pravastatin at current dose--she appears to be tolerating this well without significant side effects.--This was discussed with  clinical pharmacist  #5-history depression this has been very stable now for a while she is on low-dose Lexapro appears to be tolerating this as well.  #6 history of vitamin D deficiency she is on supplementation vitamin D level was 35.9 back in December will monitor periodically.  #7 history neuropathy she is on Neurontin this appears to have improved her complaints of stinging in her lower extremities.  #8 history of venous stasis changes edema-again she is on low-dose Lasix with an order to hold this for systolic blood pressure less than 100 she has gained some weight although I suspect this is more appetite related will monitor.  #9 history hypertension again she is on the low-dose Lasix with orders to hold this for systolic less than 213-YQM is also on low-dose lisinopril 2.5 mg a day with her history of diabetes- Again will write order to hold lisinopril systolic blood pressures less  than 100.  Continues to have variable pressures ranging from systolics in the 578I down to the 90s she is asymptomatic of any hypotension however-no dizziness syncope palpitations or increased weakness  #10-anemia this appears to be stable with a hemoglobin 10.7.  #11 hypothyroidism TSH was within normal limits at 1.175 on lab done earlier this month she is on Synthroid  Of note Will update a metabolic panel next week as well as a CBC to keep an eye on her hemoglobin as well as electrolytes and renal function this has been stable now for some time.  [id number] note greater than 35 minutes spent assessing patient-reviewing her chart reviewing her labs-and coordinating and formulating a plan of care for numerous diagnoses-of note greater than 50% of time spent coordinating plan of care

## 2016-09-27 ENCOUNTER — Other Ambulatory Visit (HOSPITAL_COMMUNITY)
Admission: RE | Admit: 2016-09-27 | Discharge: 2016-09-27 | Disposition: A | Discharge status: Home / Self Care | Payer: Medicare Other | Source: Skilled Nursing Facility | Attending: Internal Medicine | Admitting: Internal Medicine

## 2016-09-27 DIAGNOSIS — I10 Essential (primary) hypertension: Secondary | ICD-10-CM | POA: Diagnosis present

## 2016-09-27 LAB — CBC WITH DIFFERENTIAL/PLATELET
BASOS ABS: 0 10*3/uL (ref 0.0–0.1)
Basophils Relative: 0 %
Eosinophils Absolute: 0.2 10*3/uL (ref 0.0–0.7)
Eosinophils Relative: 3 %
HEMATOCRIT: 39.8 % (ref 36.0–46.0)
HEMOGLOBIN: 11.7 g/dL — AB (ref 12.0–15.0)
Lymphocytes Relative: 35 %
Lymphs Abs: 1.7 10*3/uL (ref 0.7–4.0)
MCH: 24.2 pg — ABNORMAL LOW (ref 26.0–34.0)
MCHC: 29.4 g/dL — ABNORMAL LOW (ref 30.0–36.0)
MCV: 82.4 fL (ref 78.0–100.0)
Monocytes Absolute: 0.4 10*3/uL (ref 0.1–1.0)
Monocytes Relative: 8 %
NEUTROS PCT: 54 %
Neutro Abs: 2.7 10*3/uL (ref 1.7–7.7)
Platelets: 259 10*3/uL (ref 150–400)
RBC: 4.83 MIL/uL (ref 3.87–5.11)
RDW: 14.6 % (ref 11.5–15.5)
WBC: 5 10*3/uL (ref 4.0–10.5)

## 2016-09-27 LAB — BASIC METABOLIC PANEL
Anion gap: 8 (ref 5–15)
BUN: 18 mg/dL (ref 6–20)
CALCIUM: 8.7 mg/dL — AB (ref 8.9–10.3)
CHLORIDE: 104 mmol/L (ref 101–111)
CO2: 30 mmol/L (ref 22–32)
CREATININE: 1.13 mg/dL — AB (ref 0.44–1.00)
GFR calc non Af Amer: 41 mL/min — ABNORMAL LOW (ref >60)
GFR, EST AFRICAN AMERICAN: 47 mL/min — AB (ref >60)
Glucose, Bld: 132 mg/dL — ABNORMAL HIGH (ref 65–99)
Potassium: 4.5 mmol/L (ref 3.5–5.1)
Sodium: 142 mmol/L (ref 135–145)

## 2016-10-20 ENCOUNTER — Encounter: Payer: Self-pay | Admitting: Internal Medicine

## 2016-10-20 ENCOUNTER — Non-Acute Institutional Stay (SKILLED_NURSING_FACILITY): Payer: Medicare Other | Admitting: Internal Medicine

## 2016-10-20 DIAGNOSIS — E038 Other specified hypothyroidism: Secondary | ICD-10-CM | POA: Diagnosis not present

## 2016-10-20 DIAGNOSIS — I10 Essential (primary) hypertension: Secondary | ICD-10-CM

## 2016-10-20 DIAGNOSIS — I739 Peripheral vascular disease, unspecified: Secondary | ICD-10-CM

## 2016-10-20 DIAGNOSIS — E1151 Type 2 diabetes mellitus with diabetic peripheral angiopathy without gangrene: Secondary | ICD-10-CM

## 2016-10-20 NOTE — Progress Notes (Signed)
Location:   [facility] Room Number: 149/W Place of Service:  SNF [phone]) Provider:  Thurmond Butts, MD  Patient Care Team: Fayrene Helper, MD as PCP - General  Extended Emergency Contact Information Primary Emergency Contact: Valarie Merino Address: [address], [place] Phone: [phone] Work Phone: [phone] Relation: Daughter  Code Status:  Full Code Goals of care: Advanced Directive information Advanced Directives 10/20/2016  Does Patient Have a Medical Advance Directive? Yes  Type of Advance Directive (No Data)  Does patient want to make changes to medical advance directive? No - Patient declined  Copy of [place] in Chart? -     Chief Complaint  Patient presents with  . Medical Management of Chronic Issues    Routine Visit,     HPI:  Pt is a 83 y.o. female seen today for an acute visit for  Patient has H/O Dementia, Diabetes Type 2, Hypothyroidism, Peripheral vascular disease and Chronic Venous stasis, Hyperlipidemia  Patient is doing well in facility. Since my last visit she had Pneumonia which was treated in the facility and patien trecovered well with no Complications. She denies any problems. No new Nursing issues. Her weight is upto 226 lbs and it is mostly because she eats snacks all the time. Her BS are running less then 150.   Past Medical History:  Diagnosis Date  . Acute cystitis   . Dementia   . Dermatomycosis, unspecified   . DJD (degenerative joint disease)    Spine  . DM (diabetes mellitus) ([place])   . GERD (gastroesophageal reflux disease)   . History of pulmonary embolism   . History of supraventricular tachycardia   . Hyperlipidemia   . Hypertension   . Hypothyroidism   . Long term (current) use of anticoagulants    coumadin  . Obesity   . Other malaise and fatigue   . PVD (peripheral vascular disease) ([place])     Past Surgical History:  Procedure Laterality Date  . ATRIAL ABLATION SURGERY  01/2003   Catheter, to tachic palpitations  . MASTECTOMY  2003   Left  . THYROIDECTOMY, PARTIAL  1962    Allergies  Allergen Reactions  . Contrast Media [Iodinated Diagnostic Agents] Other (See Comments)    Unknown-listed on MAR-patient is not aware of these allergies  . Latex Other (See Comments)    Unknown-listed on MAR-patient is not aware of these allergies    Outpatient Encounter Prescriptions as of 10/20/2016  Medication Sig  . acetaminophen (TYLENOL) 325 MG tablet Take 650 mg by mouth 2 (two) times daily.  Marland Kitchen aspirin 81 MG tablet Take 81 mg by mouth daily.   Roseanne Kaufman [place]-Castor Oil (VENELEX) OINT Apply to sacrum and bilateral buttocks each shift and prn for prevention  . calcium-vitamin D (OSCAL WITH D) 500-200 MG-UNIT per tablet Take 1 tablet by mouth 2 (two) times daily.   . cholecalciferol (VITAMIN D) 1000 units tablet Take 1,000 Units by mouth 2 (two) times daily.   . cilostazol (PLETAL) 50 MG tablet Take 50 mg by mouth 2 (two) times daily.    Marland Kitchen donepezil (ARICEPT) 10 MG tablet Take 10 mg by mouth every evening.   . escitalopram (LEXAPRO) 5 MG tablet Take two tablets by mouth at bedtime for mood  . furosemide (LASIX) 20 MG tablet Take 20 mg by mouth daily. Hold for systolic below BP below  100  . gabapentin (NEURONTIN) 300 MG capsule Take 300 mg by mouth 2 (two) times daily.    Marland Kitchen levothyroxine (SYNTHROID, LEVOTHROID) 50 MCG tablet Take 50 mcg by mouth daily.    Marland Kitchen lisinopril (PRINIVIL,ZESTRIL) 2.5 MG tablet Take 2.5 mg by mouth daily.  . memantine (NAMENDA) 10 MG tablet Take one tablet by mouth twice daily  . metFORMIN (GLUCOPHAGE) 500 MG tablet Take 500 mg by mouth 2 (two) times daily with a meal.    . omeprazole (PRILOSEC) 20 MG capsule Take 20 mg by mouth at bedtime.    . pravastatin (PRAVACHOL) 80 MG tablet Take 80 mg by mouth at bedtime.     No facility-administered encounter medications  on file as of 10/20/2016.      Review of Systems  Review of Systems  Constitutional: Negative for activity change, appetite change, chills, diaphoresis, fatigue and fever.  HENT: Negative for mouth sores, postnasal drip, rhinorrhea, sinus pain and sore throat.   Respiratory: Negative for apnea, cough, chest tightness, shortness of breath and wheezing.   Cardiovascular: Negative for chest pain, palpitations and leg swelling.  Gastrointestinal: Negative for abdominal distention, abdominal pain, constipation, diarrhea, nausea and vomiting.  Genitourinary: Negative for dysuria and frequency.  Musculoskeletal: Negative for arthralgias, joint swelling and myalgias.  Skin: Negative for rash.  Neurological: Negative for dizziness, syncope, weakness, light-headedness and numbness.  Psychiatric/Behavioral: Negative for behavioral problems, confusion and sleep disturbance.     Immunization History  Administered Date(s) Administered  . Influenza Whole 10/11/2006  . Influenza-Unspecified 03/19/2009  . Td 01/14/2003   Pertinent  Health Maintenance Due  Topic Date Due  . URINE MICROALBUMIN  10/26/2016 (Originally 03/14/2009)  . FOOT EXAM  11/25/2016 (Originally 01/04/1932)  . DEXA SCAN  11/25/2016 (Originally 01/04/1989)  . PNA vac Low Risk Adult (1 of 2 - PCV13) 02/25/2017 (Originally 01/04/1989)  . INFLUENZA VACCINE  10/26/2016  . OPHTHALMOLOGY EXAM  01/04/2017  . HEMOGLOBIN A1C  02/02/2017   No flowsheet data found. Functional Status Survey:    Vitals:   10/20/16 1328  BP: (!) 144/62  Pulse: 74  Resp: 18  Temp: 98.3 F (36.8 C)  TempSrc: Oral   There is no height or weight on file to calculate BMI. Physical Exam   Physical Exam  Constitutional: She appears well-developed and well-nourished.  HENT:  Head: Normocephalic.  Mouth/Throat: Oropharynx is clear and moist.  Eyes: Pupils are equal, round, and reactive to light.  Neck: Neck supple.  Cardiovascular: Normal rate,  regular rhythm and normal heart sounds.   Pulmonary/Chest: Effort normal and breath sounds normal. No respiratory distress. She has no wheezes. She has no rales.  Abdominal: Soft. Bowel sounds are normal. She exhibits no distension. There is no tenderness. There is no rebound.  Musculoskeletal:  Trace edema B/L  Labs reviewed:  Recent Labs  08/31/16 1300 09/02/16 0700 09/27/16 0500  NA 140 140 142  K 4.4 4.3 4.5  CL 101 100* 104  CO2 33* 33* 30  GLUCOSE 112* 128* 132*  BUN 13 14 18   CREATININE 1.08* 1.05* 1.13*  CALCIUM 8.7* 8.9 8.7*    Recent Labs  01/07/16 0500 04/27/16 0730 08/31/16 1300  AST 13* 15 16  ALT 9* 9* 10*  ALKPHOS 58 56 64  BILITOT 0.5 0.4 0.3  PROT 6.4* 6.4* 6.7  ALBUMIN 3.2* 3.1* 3.3*    Recent Labs  08/31/16 1300 09/02/16 0700 09/27/16 0500  WBC 4.4 5.1 5.0  NEUTROABS 2.4 2.9 2.7  HGB 11.0*  10.7* 11.7*  HCT 37.1 37.1 39.8  MCV 82.3 82.8 82.4  PLT 230 212 259   Lab Results  Component Value Date   TSH 1.175 08/31/2016   Lab Results  Component Value Date   HGBA1C 7.2 (H) 08/02/2016   Lab Results  Component Value Date   CHOL 155 03/01/2016   HDL 39 (L) 03/01/2016   LDLCALC 105 (H) 03/01/2016   TRIG 56 03/01/2016   CHOLHDL 4.0 03/01/2016    Significant Diagnostic Results in last 30 days:  No results found.  Assessment/Plan   Diabetes mellitus  BS running less then150 mostly A1C is 7.2 in 05/18 Continue same dose of metformin. Will repeat A1C next blood draw  Essential hypertension BP slightly high will increase her Lisinopril to 5 mg Patient also on Lasix repeat BMP in 2 weeks  Peripheral vascular disease ([place]) Stable on aspirin and Pletal Stable with no complains  Hypothyroidism TSH is Normal. In 06/18  Alzheimer's dementia  Patient stable on Aricept and nemenda  Mixed hyperlipidemia Continue Pravastatin. LDL is 105. But with her age would not be too aggressive.  Low Vitamin D Repeat Vit D level  Also  Check DEXA scan and Urine for Microalbuminuria.   Family/ staff Communication:   Labs/tests ordered:

## 2016-10-21 ENCOUNTER — Other Ambulatory Visit (HOSPITAL_COMMUNITY)
Admission: RE | Admit: 2016-10-21 | Discharge: 2016-10-21 | Disposition: A | Discharge status: Home / Self Care | Payer: Medicare Other | Source: Skilled Nursing Facility | Attending: Internal Medicine | Admitting: Internal Medicine

## 2016-10-21 DIAGNOSIS — E119 Type 2 diabetes mellitus without complications: Secondary | ICD-10-CM | POA: Insufficient documentation

## 2016-10-22 LAB — MICROALBUMIN, URINE: MICROALB UR: 5.1 ug/mL — AB

## 2016-11-03 ENCOUNTER — Other Ambulatory Visit (HOSPITAL_COMMUNITY)
Admission: RE | Admit: 2016-11-03 | Discharge: 2016-11-03 | Disposition: A | Discharge status: Home / Self Care | Payer: Medicare Other | Source: Skilled Nursing Facility | Attending: Internal Medicine | Admitting: Internal Medicine

## 2016-11-03 DIAGNOSIS — E119 Type 2 diabetes mellitus without complications: Secondary | ICD-10-CM | POA: Insufficient documentation

## 2016-11-03 DIAGNOSIS — I10 Essential (primary) hypertension: Secondary | ICD-10-CM | POA: Diagnosis present

## 2016-11-03 LAB — HEMOGLOBIN A1C
HEMOGLOBIN A1C: 7.2 % — AB (ref 4.8–5.6)
MEAN PLASMA GLUCOSE: 159.94 mg/dL

## 2016-11-03 LAB — BASIC METABOLIC PANEL
ANION GAP: 9 (ref 5–15)
Anion gap: 8 (ref 5–15)
BUN: 15 mg/dL (ref 6–20)
BUN: 16 mg/dL (ref 6–20)
CALCIUM: 8.7 mg/dL — AB (ref 8.9–10.3)
CALCIUM: 9 mg/dL (ref 8.9–10.3)
CO2: 30 mmol/L (ref 22–32)
CO2: 31 mmol/L (ref 22–32)
CREATININE: 1.11 mg/dL — AB (ref 0.44–1.00)
Chloride: 104 mmol/L (ref 101–111)
Chloride: 103 mmol/L (ref 101–111)
Creatinine, Ser: 1.12 mg/dL — ABNORMAL HIGH (ref 0.44–1.00)
GFR calc Af Amer: 48 mL/min — ABNORMAL LOW (ref >60)
GFR calc non Af Amer: 42 mL/min — ABNORMAL LOW (ref >60)
GFR, EST AFRICAN AMERICAN: 48 mL/min — AB (ref >60)
GFR, EST NON AFRICAN AMERICAN: 41 mL/min — AB (ref >60)
Glucose, Bld: 190 mg/dL — ABNORMAL HIGH (ref 65–99)
Glucose, Bld: 108 mg/dL — ABNORMAL HIGH (ref 65–99)
Potassium: 4 mmol/L (ref 3.5–5.1)
Potassium: 4 mmol/L (ref 3.5–5.1)
SODIUM: 142 mmol/L (ref 135–145)
Sodium: 143 mmol/L (ref 135–145)

## 2016-11-04 ENCOUNTER — Other Ambulatory Visit (HOSPITAL_COMMUNITY)
Admission: RE | Admit: 2016-11-04 | Discharge: 2016-11-04 | Disposition: A | Discharge status: Home / Self Care | Payer: Medicare Other | Source: Skilled Nursing Facility | Attending: Internal Medicine | Admitting: Internal Medicine

## 2016-11-04 DIAGNOSIS — D649 Anemia, unspecified: Secondary | ICD-10-CM | POA: Insufficient documentation

## 2016-11-04 LAB — CBC
HCT: 37.7 % (ref 36.0–46.0)
Hemoglobin: 11.1 g/dL — ABNORMAL LOW (ref 12.0–15.0)
MCH: 24.6 pg — ABNORMAL LOW (ref 26.0–34.0)
MCHC: 29.4 g/dL — ABNORMAL LOW (ref 30.0–36.0)
MCV: 83.4 fL (ref 78.0–100.0)
PLATELETS: 230 10*3/uL (ref 150–400)
RBC: 4.52 MIL/uL (ref 3.87–5.11)
RDW: 15.2 % (ref 11.5–15.5)
WBC: 4.4 10*3/uL (ref 4.0–10.5)

## 2016-11-25 ENCOUNTER — Non-Acute Institutional Stay (SKILLED_NURSING_FACILITY): Payer: Medicare Other | Admitting: Internal Medicine

## 2016-11-25 DIAGNOSIS — I10 Essential (primary) hypertension: Secondary | ICD-10-CM

## 2016-11-25 DIAGNOSIS — I739 Peripheral vascular disease, unspecified: Secondary | ICD-10-CM | POA: Diagnosis not present

## 2016-11-25 DIAGNOSIS — R609 Edema, unspecified: Secondary | ICD-10-CM

## 2016-11-25 DIAGNOSIS — E782 Mixed hyperlipidemia: Secondary | ICD-10-CM

## 2016-11-25 DIAGNOSIS — E038 Other specified hypothyroidism: Secondary | ICD-10-CM

## 2016-11-25 DIAGNOSIS — E1151 Type 2 diabetes mellitus with diabetic peripheral angiopathy without gangrene: Secondary | ICD-10-CM

## 2016-11-25 DIAGNOSIS — F0280 Dementia in other diseases classified elsewhere without behavioral disturbance: Secondary | ICD-10-CM

## 2016-11-25 DIAGNOSIS — G309 Alzheimer's disease, unspecified: Secondary | ICD-10-CM

## 2016-11-26 ENCOUNTER — Encounter (HOSPITAL_COMMUNITY)
Admission: RE | Admit: 2016-11-26 | Discharge: 2016-11-26 | Disposition: A | Discharge status: Home / Self Care | Payer: Medicare Other | Source: Other Acute Inpatient Hospital | Attending: Internal Medicine | Admitting: Internal Medicine

## 2016-11-26 DIAGNOSIS — E785 Hyperlipidemia, unspecified: Secondary | ICD-10-CM | POA: Insufficient documentation

## 2016-11-26 LAB — LIPID PANEL
Cholesterol: 150 mg/dL (ref 0–200)
HDL: 45 mg/dL (ref >40)
LDL CALC: 85 mg/dL (ref 0–99)
Total CHOL/HDL Ratio: 3.3 RATIO
Triglycerides: 99 mg/dL (ref <150)
VLDL: 20 mg/dL (ref 0–40)

## 2016-11-27 NOTE — Progress Notes (Signed)
This is a routine visit.  Level care skilled.  Facility is [facility].  Chief complaint-routine visit for medical management of chronic medical conditions including dementia-diabetes type 2-hypothyroidism-peripheral vascular disease-venous stasis edema-hyperlipidemia-hypertension-vitamin D deficiency-depression.  History of present illness.  Patient is a pleasant 83 year old female with the above diagnosis she continues to have a period of stability here  No recent acute issues several  months ago she was treated for pneumonia but has not really had a reoccurrence-she is eating well still continues to snack she has gained a small amount of weight over the past month now almost 230 pounds.  She does have a history of diabetes on Glucophage and this appears controlled with blood sugars still largely under 150.  She does have a history of dementia but this appears to be mild she is on Aricept as well as Namenda.  Regards peripheral vascular disease she is not really complaining of pain at this time she is on aspirin and Pletal  She's also been started on Neurontin for concerns of possible some mild peripheral neuropathy.  Regards to venous stasis her edema appears relatively baseline she is on Lasix.  Her blood pressure appears to be well controlled recent blood pressures 136/82-119/74-she is on lisinopril 5 mg a day as well as Lasix 20 mg a day.  She does have a history of hyperlipidemia she is on pravastatin and LDL was 1050 most recent lab.  She also has a history of vitamin D deficiency is on vitamin D level was 35.7 back in December 2017.  At one point she appeared to exhibit depressive symptoms is not talking as much. Be more withdrawn but she was started on low-dose Lexapro she has had a very good response to this she is back at her baseline and has been now for some time.  \He does have a history hypothyroidism continues on Synthroid level back in June was within normal  limits at 1.175 Also at times is complain of GERD-like symptoms present on proton pump inhibitor and this appears to be controlled  Currently she is sitting in her chair comfortably has bright alert gregarious very talkative which is her baseline  Past Medical History:  Diagnosis Date  . Acute cystitis   . Dementia   . Dermatomycosis, unspecified   . DJD (degenerative joint disease)    Spine  . DM (diabetes mellitus) ([place])   . GERD (gastroesophageal reflux disease)   . History of pulmonary embolism   . History of supraventricular tachycardia   . Hyperlipidemia   . Hypertension   . Hypothyroidism   . Long term (current) use of anticoagulants    coumadin  . Obesity   . Other malaise and fatigue   . PVD (peripheral vascular disease) ([place])         Past Surgical History:  Procedure Laterality Date  . ATRIAL ABLATION SURGERY  01/2003   Catheter, to tachic palpitations  . MASTECTOMY  2003   Left  . THYROIDECTOMY, PARTIAL  1962         Allergies  Allergen Reactions  . Contrast Media [Iodinated Diagnostic Agents] Other (See Comments)    Unknown-listed on MAR-patient is not aware of these allergies  . Latex Other (See Comments)    Unknown-listed on MAR-patient is not aware of these allergies           Medication Sig  . acetaminophen (TYLENOL) 325 MG tablet Take 650 mg by mouth 2 (two) times daily.  Marland Kitchen aspirin 81 MG  tablet Take 81 mg by mouth daily.   Roseanne Kaufman [place]-Castor Oil (VENELEX) OINT Apply to sacrum and bilateral buttocks each shift and prn for prevention  . calcium-vitamin D (OSCAL WITH D) 500-200 MG-UNIT per tablet Take 1 tablet by mouth 2 (two) times daily.   . cholecalciferol (VITAMIN D) 1000 units tablet Take 1,000 Units by mouth 2 (two) times daily.   . cilostazol (PLETAL) 50 MG tablet Take 50 mg by mouth 2 (two) times daily.    Marland Kitchen donepezil (ARICEPT) 10 MG tablet Take 10 mg by mouth every evening.   . escitalopram (LEXAPRO) 5 MG  tablet Take two tablets by mouth at bedtime for mood  . furosemide (LASIX) 20 MG tablet Take 20 mg by mouth daily. Hold for systolic below BP below 376  . gabapentin (NEURONTIN) 300 MG capsule Take 300 mg by mouth 2 (two) times daily.    Marland Kitchen levothyroxine (SYNTHROID, LEVOTHROID) 50 MCG tablet Take 50 mcg by mouth daily.    Marland Kitchen lisinopril (PRINIVIL,ZESTRIL) 2.5 MG tablet Take 2.5 mg by mouth daily.  . memantine (NAMENDA) 10 MG tablet Take one tablet by mouth twice daily  . metFORMIN (GLUCOPHAGE) 500 MG tablet Take 500 mg by mouth 2 (two) times daily with a meal.    . omeprazole (PRILOSEC) 20 MG capsule Take 20 mg by mouth at bedtime.    . pravastatin (PRAVACHOL) 80 MG tablet Take 80 mg by mouth at bedtime.     No facility-administered encounter medications on file as of 10/20/2016.     Review of systems.  General she does not complaining of any fever or chills has had some slight weight gain suspect this is appetite related.  Skin does not complain of rashes or itching.  Head ears eyes nose mouth and throat does not complain of visual changes sore throat or difficulty swallowing.  Respiratory is not complaining of any shortness of breath or cough.  Cardiac denies chest pain has chronic venous stasis edema.  GI does not complaining of any abdominal discomfort nausea vomiting diarrhea constipation.  GU denies dysuria.  Musculoskeletal is not complaining of joint pain currently.  Neurologic does not complain of dizziness headache syncope or numbness does have some suspected possible mild peripheral neuropathy.  Psych does not complain of overt anxiety or depression her dementia appears to be mild depression appears to be under very good control.  Physical exam.  Temperature is 97.8 pulse 76 respirations 19 blood pressure 136/82 weight is 229.6 this is up about 3 pounds over the past month.  In general this is a pleasant elderly female who looks younger than her stated age.  Her  skin is warm and dry.  Eyes pupils appear reactive light sclera and conjunctiva are clear visual acuity appears grossly intact.  Oropharynx is clear mucous membranes moist.  Chest is clear to auscultation with shallow air entry no labored breathing.  Heart is regular rate and rhythm without murmur gallop or rub she has baseline lower extremity edema.  Abdomen is obese soft nontender with active bowel sounds.  Muscle skeletal largely ambulates in a wheelchair moves all extremities 4 at times she will use a walker strength appears to be intact on 4 extremities.  Neurologic is grossly intact her speech is clear no lateralizing findings.  Psych she is grossly alert and oriented does have some periods of confusion I would classify her dementia as quite mild.  Labs.  11/04/2016.  WBC 4.4 hemoglobin 11.1 platelets 2:30.  11/03/2016.  Sodium  142 potassium 4 BUN 16 creatinine 1.11.  Hemoglobin A1c 7.3.  June 2018   BNP was 117.  04/27/2013.  Albumin 3.1 ALT 9 otherwise liver function tests within normal limits.   Assessment and plan.  #1-diabetes type 2-this appears to be under decent control blood sugars largely in the lower mid 100s very infrequent readings above 150 hemoglobin A1c was 7.3 earlier this month will continue current dose of 5.  #2 hypertension this appears stable recent blood pressures 136/82-119/74-133/84-continues on low-dose lisinopril as well as Lasix.  #3 peripheral vascular disease this appears stable she is on aspirin and Pletal--he does not really complain of pain.  4--hypothyroidism TSH was 1.175 on lab done in June continue current dose of Synthroid and will monitor periodically.  #5-dementia this continues to be quite mild she is doing well with supportive care she is on Aricept as well as Namenda.  #6 history of venous stasis edema this appears relatively baseline I suspect weight gain is more appetite related she is on Lasix-B NP was  unremarkable at 117 back in June.  #7-hyperlipidemia last lipid panel showed an LDL 105 we will update this since last lipid panel was 9 months ago-she is on pravastatin.  #8 history of vitamin D deficiency she is on supplementation vitamin D level was adequate at 35.7 back in December 2017 will update this as well.   #9 depression this appears stable low-dose Lexapro at one time she had a subdued  presentation but this certainly turned around she is pleasant and engaging occasionally will sing  which is her baseline   #10 peripheral neuropathy-likely diabetic related--- she is on Neurontin 300 mg twice a day appears to be tolerating this well. - is not really complaining of burning or tingling at this time which is encouraging she has complained of this previously  #11 anemia suspect an element of chronic disease this continues to be quite stable at 11.1 on lab done earlier this month   #12   GERD-this appears relatively asymptomatic and controlled on PPI   [id number]-of note greater than 35 minutes spent assessing patient-reviewing her chart-reviewing her labs-and coordinating and formulating a plan of care for numerous diagnoses-of note greater than 50% of time spent coordinating plan of care       \

## 2016-11-29 LAB — VITAMIN D 25 HYDROXY (VIT D DEFICIENCY, FRACTURES): Vit D, 25-Hydroxy: 35 ng/mL (ref 30.0–100.0)

## 2016-12-15 ENCOUNTER — Non-Acute Institutional Stay (SKILLED_NURSING_FACILITY): Payer: Medicare Other

## 2016-12-15 DIAGNOSIS — Z0000 Encounter for general adult medical examination without abnormal findings: Secondary | ICD-10-CM | POA: Diagnosis not present

## 2016-12-15 NOTE — Patient Instructions (Addendum)
[name] , Thank you for taking time to come for your Medicare Wellness Visit. I appreciate your ongoing commitment to your health goals. Please review the following plan we discussed and let me know if I can assist you in the future.   Screening recommendations/referrals: Colonoscopy excluded, pt is over age 81 Mammogram excluded, pt is over age 10 Bone Density up to date Recommended yearly ophthalmology/optometry visit for glaucoma screening and checkup Recommended yearly dental visit for hygiene and checkup  Vaccinations: Influenza vaccine due Pneumococcal vaccine 13 due, declined by family Tdap vaccine due Shingles vaccine not in records    Advanced directives: Need a copy of living will and health care power of attorney for chart  Conditions/risks identified: None  Next appointment: Dr. Lyndel Safe makes rounds   Preventive Care 65 Years and Older, Female Preventive care refers to lifestyle choices and visits with your health care provider that can promote health and wellness. What does preventive care include?  A yearly physical exam. This is also called an annual well check.  Dental exams once or twice a year.  Routine eye exams. Ask your health care provider how often you should have your eyes checked.  Personal lifestyle choices, including:  Daily care of your teeth and gums.  Regular physical activity.  Eating a healthy diet.  Avoiding tobacco and drug use.  Limiting alcohol use.  Practicing safe sex.  Taking low-dose aspirin every day.  Taking vitamin and mineral supplements as recommended by your health care provider. What happens during an annual well check? The services and screenings done by your health care provider during your annual well check will depend on your age, overall health, lifestyle risk factors, and family history of disease. Counseling  Your health care provider may ask you questions about your:  Alcohol use.  Tobacco use.  Drug  use.  Emotional well-being.  Home and relationship well-being.  Sexual activity.  Eating habits.  History of falls.  Memory and ability to understand (cognition).  Work and work Statistician.  Reproductive health. Screening  You may have the following tests or measurements:  Height, weight, and BMI.  Blood pressure.  Lipid and cholesterol levels. These may be checked every 5 years, or more frequently if you are over 65 years old.  Skin check.  Lung cancer screening. You may have this screening every year starting at age 47 if you have a 30-pack-year history of smoking and currently smoke or have quit within the past 15 years.  Fecal occult blood test (FOBT) of the stool. You may have this test every year starting at age 14.  Flexible sigmoidoscopy or colonoscopy. You may have a sigmoidoscopy every 5 years or a colonoscopy every 10 years starting at age 53.  Hepatitis C blood test.  Hepatitis B blood test.  Sexually transmitted disease (STD) testing.  Diabetes screening. This is done by checking your blood sugar (glucose) after you have not eaten for a while (fasting). You may have this done every 1-3 years.  Bone density scan. This is done to screen for osteoporosis. You may have this done starting at age 34.  Mammogram. This may be done every 1-2 years. Talk to your health care provider about how often you should have regular mammograms. Talk with your health care provider about your test results, treatment options, and if necessary, the need for more tests. Vaccines  Your health care provider may recommend certain vaccines, such as:  Influenza vaccine. This is recommended every year.  Tetanus, diphtheria, and acellular pertussis (Tdap, Td) vaccine. You may need a Td booster every 10 years.  Zoster vaccine. You may need this after age 44.  Pneumococcal 13-valent conjugate (PCV13) vaccine. One dose is recommended after age 55.  Pneumococcal polysaccharide  (PPSV23) vaccine. One dose is recommended after age 5. Talk to your health care provider about which screenings and vaccines you need and how often you need them. This information is not intended to replace advice given to you by your health care provider. Make sure you discuss any questions you have with your health care provider. Document Released: 04/10/2015 Document Revised: 12/02/2015 Document Reviewed: 01/13/2015 Elsevier Interactive Patient Education  2017 [place] Prevention in the Home Falls can cause injuries. They can happen to people of all ages. There are many things you can do to make your home safe and to help prevent falls. What can I do on the outside of my home?  Regularly fix the edges of walkways and driveways and fix any cracks.  Remove anything that might make you trip as you walk through a door, such as a raised step or threshold.  Trim any bushes or trees on the path to your home.  Use bright outdoor lighting.  Clear any walking paths of anything that might make someone trip, such as rocks or tools.  Regularly check to see if handrails are loose or broken. Make sure that both sides of any steps have handrails.  Any raised decks and porches should have guardrails on the edges.  Have any leaves, snow, or ice cleared regularly.  Use sand or salt on walking paths during winter.  Clean up any spills in your garage right away. This includes oil or grease spills. What can I do in the bathroom?  Use night lights.  Install grab bars by the toilet and in the tub and shower. Do not use towel bars as grab bars.  Use non-skid mats or decals in the tub or shower.  If you need to sit down in the shower, use a plastic, non-slip stool.  Keep the floor dry. Clean up any water that spills on the floor as soon as it happens.  Remove soap buildup in the tub or shower regularly.  Attach bath mats securely with double-sided non-slip rug tape.  Do not have  throw rugs and other things on the floor that can make you trip. What can I do in the bedroom?  Use night lights.  Make sure that you have a light by your bed that is easy to reach.  Do not use any sheets or blankets that are too big for your bed. They should not hang down onto the floor.  Have a firm chair that has side arms. You can use this for support while you get dressed.  Do not have throw rugs and other things on the floor that can make you trip. What can I do in the kitchen?  Clean up any spills right away.  Avoid walking on wet floors.  Keep items that you use a lot in easy-to-reach places.  If you need to reach something above you, use a strong step stool that has a grab bar.  Keep electrical cords out of the way.  Do not use floor polish or wax that makes floors slippery. If you must use wax, use non-skid floor wax.  Do not have throw rugs and other things on the floor that can make you trip. What can I do  with my stairs?  Do not leave any items on the stairs.  Make sure that there are handrails on both sides of the stairs and use them. Fix handrails that are broken or loose. Make sure that handrails are as long as the stairways.  Check any carpeting to make sure that it is firmly attached to the stairs. Fix any carpet that is loose or worn.  Avoid having throw rugs at the top or bottom of the stairs. If you do have throw rugs, attach them to the floor with carpet tape.  Make sure that you have a light switch at the top of the stairs and the bottom of the stairs. If you do not have them, ask someone to add them for you. What else can I do to help prevent falls?  Wear shoes that:  Do not have high heels.  Have rubber bottoms.  Are comfortable and fit you well.  Are closed at the toe. Do not wear sandals.  If you use a stepladder:  Make sure that it is fully opened. Do not climb a closed stepladder.  Make sure that both sides of the stepladder are  locked into place.  Ask someone to hold it for you, if possible.  Clearly mark and make sure that you can see:  Any grab bars or handrails.  First and last steps.  Where the edge of each step is.  Use tools that help you move around (mobility aids) if they are needed. These include:  Canes.  Walkers.  Scooters.  Crutches.  Turn on the lights when you go into a dark area. Replace any light bulbs as soon as they burn out.  Set up your furniture so you have a clear path. Avoid moving your furniture around.  If any of your floors are uneven, fix them.  If there are any pets around you, be aware of where they are.  Review your medicines with your doctor. Some medicines can make you feel dizzy. This can increase your chance of falling. Ask your doctor what other things that you can do to help prevent falls. This information is not intended to replace advice given to you by your health care provider. Make sure you discuss any questions you have with your health care provider. Document Released: 01/08/2009 Document Revised: 08/20/2015 Document Reviewed: 04/18/2014 Elsevier Interactive Patient Education  2017 [employer].

## 2016-12-15 NOTE — Progress Notes (Signed)
Subjective:   Brandi Rice is a 83 y.o. female who presents for an Initial Medicare Annual Wellness Visit at [facility]       Objective:    Today's Vitals   12/15/16 1014  BP: 130/70  Pulse: 84  Temp: 98.9 F (37.2 C)  TempSrc: Oral  SpO2: 97%  Weight: 231 lb (104.8 kg)  Height: 5\' 9"  (1.753 m)   Body mass index is 34.11 kg/m.   Current Medications (verified) Outpatient Encounter Prescriptions as of 12/15/2016  Medication Sig  . acetaminophen (TYLENOL) 325 MG tablet Take 650 mg by mouth 2 (two) times daily.  Marland Kitchen aspirin 81 MG tablet Take 81 mg by mouth daily.   Roseanne Kaufman [place]-Castor Oil (VENELEX) OINT Apply to sacrum and bilateral buttocks each shift and prn for prevention  . calcium-vitamin D (OSCAL WITH D) 500-200 MG-UNIT per tablet Take 1 tablet by mouth 2 (two) times daily.   . cholecalciferol (VITAMIN D) 1000 units tablet Take 1,000 Units by mouth 2 (two) times daily.   . cilostazol (PLETAL) 50 MG tablet Take 50 mg by mouth 2 (two) times daily.    Marland Kitchen donepezil (ARICEPT) 10 MG tablet Take 10 mg by mouth every evening.   . escitalopram (LEXAPRO) 5 MG tablet Take two tablets by mouth at bedtime for mood  . furosemide (LASIX) 20 MG tablet Take 20 mg by mouth daily. Hold for systolic below BP below 096  . gabapentin (NEURONTIN) 300 MG capsule Take 300 mg by mouth 2 (two) times daily.    Marland Kitchen levothyroxine (SYNTHROID, LEVOTHROID) 50 MCG tablet Take 50 mcg by mouth daily.    Marland Kitchen lisinopril (PRINIVIL,ZESTRIL) 2.5 MG tablet Take 2.5 mg by mouth daily.  . memantine (NAMENDA) 10 MG tablet Take one tablet by mouth twice daily  . metFORMIN (GLUCOPHAGE) 500 MG tablet Take 500 mg by mouth 2 (two) times daily with a meal.    . omeprazole (PRILOSEC) 20 MG capsule Take 20 mg by mouth at bedtime.    . pravastatin (PRAVACHOL) 80 MG tablet Take 80 mg by mouth at bedtime.     No facility-administered encounter medications on file as of 12/15/2016.     Allergies  (verified) Contrast media [iodinated diagnostic agents] and Latex   History: Past Medical History:  Diagnosis Date  . Acute cystitis   . Dementia   . Dermatomycosis, unspecified   . DJD (degenerative joint disease)    Spine  . DM (diabetes mellitus) ([place])   . GERD (gastroesophageal reflux disease)   . History of pulmonary embolism   . History of supraventricular tachycardia   . Hyperlipidemia   . Hypertension   . Hypothyroidism   . Long term (current) use of anticoagulants    coumadin  . Obesity   . Other malaise and fatigue   . PVD (peripheral vascular disease) ([place])    Past Surgical History:  Procedure Laterality Date  . ATRIAL ABLATION SURGERY  01/2003   Catheter, to tachic palpitations  . MASTECTOMY  2003   Left  . THYROIDECTOMY, PARTIAL  1962   Family History  Problem Relation Age of Onset  . Coronary artery disease Sister   . Coronary artery disease Sister   . Coronary artery disease Sister   . Heart attack Sister   . Alcohol abuse Sister    Social History   Occupational History  . Retired Retired   Social History Main Topics  . Smoking status: Never Smoker  .  Smokeless tobacco: Never Used  . Alcohol use No  . Drug use: No  . Sexual activity: Not on file    Tobacco Counseling Counseling given: Not Answered   Activities of Daily Living No flowsheet data found.  Immunizations and Health Maintenance Immunization History  Administered Date(s) Administered  . Influenza Whole 10/11/2006  . Influenza-Unspecified 03/19/2009  . Td 01/14/2003   Health Maintenance Due  Topic Date Due  . FOOT EXAM  01/04/1934  . DEXA SCAN  01/04/1989  . INFLUENZA VACCINE  10/26/2016    Patient Care Team: Fayrene Helper, MD as PCP - General  Indicate any recent Medical Services you may have received from other than [facility] providers in the past year (date may be approximate).     Assessment:   This is a routine wellness examination for [facility].    Hearing/Vision screen No exam data present  Dietary issues and exercise activities discussed:    Goals    None     Depression Screen No flowsheet data found.  Fall Risk No flowsheet data found.  Cognitive Function:        Screening Tests Health Maintenance  Topic Date Due  . FOOT EXAM  01/04/1934  . DEXA SCAN  01/04/1989  . INFLUENZA VACCINE  10/26/2016  . PNA vac Low Risk Adult (1 of 2 - PCV13) 02/25/2017 (Originally 01/04/1989)  . TETANUS/TDAP  03/29/2023 (Originally 01/13/2013)  . OPHTHALMOLOGY EXAM  01/04/2017  . HEMOGLOBIN A1C  05/06/2017  . URINE MICROALBUMIN  10/21/2017      Plan:    I have personally reviewed and addressed the Medicare Annual Wellness questionnaire and have noted the following in the patient's chart:  A. Medical and social history B. Use of alcohol, tobacco or illicit drugs  C. Current medications and supplements D. Functional ability and status E.  Nutritional status F.  Physical activity G. Advance directives H. List of other physicians I.  Hospitalizations, surgeries, and ER visits in previous 12 months J.  [place] to include hearing, vision, cognitive, depression L. Referrals and appointments - none  In addition, I have reviewed and discussed with patient certain preventive protocols, quality metrics, and best practice recommendations. A written personalized care plan for preventive services as well as general preventive health recommendations were provided to patient.  See attached scanned questionnaire for additional information.   Signed,   Rich Reining, RN Nurse Health Advisor   Quick Notes   Health Maintenance: PNA 13 due, declined by family. Foot exam,  tdap due  To D/W Family the reason for declining PNA. Patient recently had Pneumonia and it is clearly indicated.   Abnormal Screen: 6 CIT-17     Patient Concerns: None     Nurse Concerns: None

## 2017-01-30 ENCOUNTER — Encounter: Payer: Self-pay | Admitting: Internal Medicine

## 2017-01-30 NOTE — Progress Notes (Signed)
Location:   [facility] Room Number: 149/W Place of Service:  SNF [phone]) Provider:  Thurmond Butts, MD  Patient Care Team: Fayrene Helper, MD as PCP - General  Extended Emergency Contact Information Primary Emergency Contact: Valarie Merino Address: [place], [place] Phone: [phone] Work Phone: [phone] Relation: Daughter  Code Status:  Full Code Goals of care: Advanced Directive information Advanced Directives 01/30/2017  Does Patient Have a Medical Advance Directive? Yes  Type of Advance Directive (No Data)  Does patient want to make changes to medical advance directive? No - Patient declined  Copy of [place] in Chart? -     Chief Complaint  Patient presents with  . Medical Management of Chronic Issues    Routine Visit    HPI:  Pt is a 83 y.o. female seen today for medical management of chronic diseases.     Past Medical History:  Diagnosis Date  . Acute cystitis   . Dementia   . Dermatomycosis, unspecified   . DJD (degenerative joint disease)    Spine  . DM (diabetes mellitus) ([place])   . GERD (gastroesophageal reflux disease)   . History of pulmonary embolism   . History of supraventricular tachycardia   . Hyperlipidemia   . Hypertension   . Hypothyroidism   . Long term (current) use of anticoagulants    coumadin  . Obesity   . Other malaise and fatigue   . PVD (peripheral vascular disease) ([place])    Past Surgical History:  Procedure Laterality Date  . ATRIAL ABLATION SURGERY  01/2003   Catheter, to tachic palpitations  . MASTECTOMY  2003   Left  . THYROIDECTOMY, PARTIAL  1962    Allergies  Allergen Reactions  . Contrast Media [Iodinated Diagnostic Agents] Other (See Comments)    Unknown-listed on MAR-patient is not aware of these allergies  . Latex Other (See Comments)    Unknown-listed on MAR-patient is not aware  of these allergies    Outpatient Encounter Medications as of 01/30/2017  Medication Sig  . acetaminophen (TYLENOL) 325 MG tablet Take 650 mg by mouth 2 (two) times daily.  Marland Kitchen aspirin 81 MG tablet Take 81 mg by mouth daily.   Roseanne Kaufman [place]-Castor Oil (VENELEX) OINT Apply to sacrum and bilateral buttocks each shift and prn for prevention  . calcium-vitamin D (OSCAL WITH D) 500-200 MG-UNIT per tablet Take 1 tablet by mouth 2 (two) times daily.   . cholecalciferol (VITAMIN D) 1000 units tablet Take 1,000 Units by mouth 2 (two) times daily.   . cilostazol (PLETAL) 50 MG tablet Take 50 mg by mouth 2 (two) times daily.    Marland Kitchen donepezil (ARICEPT) 10 MG tablet Take 10 mg by mouth every evening.   . escitalopram (LEXAPRO) 5 MG tablet Take two tablets by mouth at bedtime for mood  . furosemide (LASIX) 20 MG tablet Take 20 mg by mouth daily. Hold for systolic below BP below 333  . gabapentin (NEURONTIN) 300 MG capsule Take 300 mg by mouth 2 (two) times daily.    Marland Kitchen levothyroxine (SYNTHROID, LEVOTHROID) 50 MCG tablet Take 50 mcg by mouth daily.    Marland Kitchen lisinopril (PRINIVIL,ZESTRIL) 2.5 MG tablet Take 2.5 mg by mouth daily.  . memantine (NAMENDA) 10 MG tablet Take one tablet by mouth twice daily  . metFORMIN (GLUCOPHAGE) 500 MG tablet Take 500 mg by mouth  2 (two) times daily with a meal.    . omeprazole (PRILOSEC) 20 MG capsule Take 20 mg by mouth at bedtime.    . pravastatin (PRAVACHOL) 80 MG tablet Take 80 mg by mouth at bedtime.     No facility-administered encounter medications on file as of 01/30/2017.      Review of Systems  Immunization History  Administered Date(s) Administered  . Influenza Whole 10/11/2006  . Influenza-Unspecified 03/19/2009  . Td 01/14/2003   Pertinent  Health Maintenance Due  Topic Date Due  . OPHTHALMOLOGY EXAM  01/04/2017  . INFLUENZA VACCINE  02/25/2017 (Originally 10/26/2016)  . PNA vac Low Risk Adult (1 of 2 - PCV13) 02/25/2017 (Originally 01/04/1989)  . HEMOGLOBIN A1C   05/06/2017  . URINE MICROALBUMIN  10/21/2017  . FOOT EXAM  12/21/2017  . DEXA SCAN  Completed   Fall Risk  12/15/2016  Falls in the past year? No   Functional Status Survey:    Vitals:   01/30/17 1420  BP: 120/74  Pulse: 66  Resp: 17  Temp: 97.9 F (36.6 C)  TempSrc: Oral  SpO2: 98%  Weight: 247 lb 9.6 oz (112.3 kg)  Height: 5\' 9"  (1.753 m)   Body mass index is 36.56 kg/m. Physical Exam  Labs reviewed: Recent Labs    09/27/16 0500 11/03/16 0400 11/03/16 1039  NA 142 143 142  K 4.5 4.0 4.0  CL 104 104 103  CO2 30 30 31   GLUCOSE 132* 190* 108*  BUN 18 15 16   CREATININE 1.13* 1.12* 1.11*  CALCIUM 8.7* 8.7* 9.0   Recent Labs    04/27/16 0730 08/31/16 1300  AST 15 16  ALT 9* 10*  ALKPHOS 56 64  BILITOT 0.4 0.3  PROT 6.4* 6.7  ALBUMIN 3.1* 3.3*   Recent Labs    08/31/16 1300 09/02/16 0700 09/27/16 0500 11/04/16 0700  WBC 4.4 5.1 5.0 4.4  NEUTROABS 2.4 2.9 2.7  --   HGB 11.0* 10.7* 11.7* 11.1*  HCT 37.1 37.1 39.8 37.7  MCV 82.3 82.8 82.4 83.4  PLT 230 212 259 230   Lab Results  Component Value Date   TSH 1.175 08/31/2016   Lab Results  Component Value Date   HGBA1C 7.2 (H) 11/03/2016   Lab Results  Component Value Date   CHOL 150 11/26/2016   HDL 45 11/26/2016   LDLCALC 85 11/26/2016   TRIG 99 11/26/2016   CHOLHDL 3.3 11/26/2016    Significant Diagnostic Results in last 30 days:  No results found.  Assessment/Plan There are no diagnoses linked to this encounter.   Family/ staff Communication:   Labs/tests ordered:      This encounter was created in error - please disregard.

## 2017-01-31 ENCOUNTER — Non-Acute Institutional Stay (SKILLED_NURSING_FACILITY): Payer: Medicare Other | Admitting: Internal Medicine

## 2017-01-31 ENCOUNTER — Ambulatory Visit (HOSPITAL_COMMUNITY)
Admit: 2017-01-31 | Discharge: 2017-01-31 | Disposition: A | Discharge status: Home / Self Care | Payer: Medicare Other | Source: Ambulatory Visit | Attending: Internal Medicine | Admitting: Internal Medicine

## 2017-01-31 ENCOUNTER — Inpatient Hospital Stay (HOSPITAL_COMMUNITY): Payer: Medicare Other

## 2017-01-31 ENCOUNTER — Encounter: Payer: Self-pay | Admitting: Internal Medicine

## 2017-01-31 DIAGNOSIS — E1151 Type 2 diabetes mellitus with diabetic peripheral angiopathy without gangrene: Secondary | ICD-10-CM

## 2017-01-31 DIAGNOSIS — E038 Other specified hypothyroidism: Secondary | ICD-10-CM | POA: Diagnosis not present

## 2017-01-31 DIAGNOSIS — I10 Essential (primary) hypertension: Secondary | ICD-10-CM

## 2017-01-31 DIAGNOSIS — G309 Alzheimer's disease, unspecified: Secondary | ICD-10-CM | POA: Diagnosis not present

## 2017-01-31 DIAGNOSIS — R600 Localized edema: Secondary | ICD-10-CM | POA: Insufficient documentation

## 2017-01-31 DIAGNOSIS — F0280 Dementia in other diseases classified elsewhere without behavioral disturbance: Secondary | ICD-10-CM

## 2017-01-31 DIAGNOSIS — E782 Mixed hyperlipidemia: Secondary | ICD-10-CM | POA: Diagnosis not present

## 2017-01-31 DIAGNOSIS — I739 Peripheral vascular disease, unspecified: Secondary | ICD-10-CM

## 2017-01-31 DIAGNOSIS — K219 Gastro-esophageal reflux disease without esophagitis: Secondary | ICD-10-CM | POA: Diagnosis not present

## 2017-01-31 NOTE — Progress Notes (Signed)
Location:   [facility] Room Number: 149/W Place of Service:  SNF [phone]) Provider:  Thurmond Butts, MD  Patient Care Team: Fayrene Helper, MD as PCP - General  Extended Emergency Contact Information Primary Emergency Contact: Valarie Merino Address: [address], [place] Phone: [phone] Work Phone: [phone] Relation: Daughter  Code Status:  Full Code Goals of care: Advanced Directive information Advanced Directives 01/31/2017  Does Patient Have a Medical Advance Directive? Yes  Type of Advance Directive (No Data)  Does patient want to make changes to medical advance directive? No - Patient declined  Copy of [place] in Chart? -     Chief Complaint  Patient presents with  . Medical Management of Chronic Issues    Routine Visit    HPI:  Pt is a 83 y.o. female seen today for medical management of chronic diseases.   Patient has H/O Dementia, Diabetes Type 2, Hypothyroidism, Peripheral vascular disease and Chronic Venous stasis, Hyperlipidemia  Patient is doing well and did not have any new complains. But I noticed that she has more swelling in her legs and her weight is up now to 247 lbs from 226 lbs in July. Patient continues to deny any SOB,Cough or chest pain. No Other New nursing issues.  Past Medical History:  Diagnosis Date  . Acute cystitis   . Dementia   . Dermatomycosis, unspecified   . DJD (degenerative joint disease)    Spine  . DM (diabetes mellitus) ([place])   . GERD (gastroesophageal reflux disease)   . History of pulmonary embolism   . History of supraventricular tachycardia   . Hyperlipidemia   . Hypertension   . Hypothyroidism   . Long term (current) use of anticoagulants    coumadin  . Obesity   . Other malaise and fatigue   . PVD (peripheral vascular disease) ([place])    Past Surgical History:  Procedure Laterality  Date  . ATRIAL ABLATION SURGERY  01/2003   Catheter, to tachic palpitations  . MASTECTOMY  2003   Left  . THYROIDECTOMY, PARTIAL  1962    Allergies  Allergen Reactions  . Contrast Media [Iodinated Diagnostic Agents] Other (See Comments)    Unknown-listed on MAR-patient is not aware of these allergies  . Latex Other (See Comments)    Unknown-listed on MAR-patient is not aware of these allergies    Outpatient Encounter Medications as of 01/31/2017  Medication Sig  . acetaminophen (TYLENOL) 325 MG tablet Take 650 mg by mouth 2 (two) times daily.  Marland Kitchen aspirin 81 MG tablet Take 81 mg by mouth daily.   Roseanne Kaufman [place]-Castor Oil (VENELEX) OINT Apply to sacrum and bilateral buttocks each shift and prn for prevention  . calcium-vitamin D (OSCAL WITH D) 500-200 MG-UNIT per tablet Take 1 tablet by mouth 2 (two) times daily.   . cholecalciferol (VITAMIN D) 1000 units tablet Take 1,000 Units by mouth 2 (two) times daily.   . cilostazol (PLETAL) 50 MG tablet Take 50 mg by mouth 2 (two) times daily.    Marland Kitchen donepezil (ARICEPT) 10 MG tablet Take 10 mg by mouth every evening.   . escitalopram (LEXAPRO) 5 MG tablet Take two tablets by mouth at bedtime for mood  . furosemide (LASIX) 20 MG tablet Take 20 mg by mouth daily. Hold for systolic below BP below 409  . gabapentin (NEURONTIN) 300 MG capsule  Take 300 mg by mouth 2 (two) times daily.    Marland Kitchen levothyroxine (SYNTHROID, LEVOTHROID) 50 MCG tablet Take 50 mcg by mouth daily.    Marland Kitchen lisinopril (PRINIVIL,ZESTRIL) 2.5 MG tablet Take 2.5 mg by mouth daily.  . memantine (NAMENDA) 10 MG tablet Take one tablet by mouth twice daily  . metFORMIN (GLUCOPHAGE) 500 MG tablet Take 500 mg by mouth 2 (two) times daily with a meal.    . omeprazole (PRILOSEC) 20 MG capsule Take 20 mg by mouth at bedtime.    . pravastatin (PRAVACHOL) 80 MG tablet Take 80 mg by mouth at bedtime.     No facility-administered encounter medications on file as of 01/31/2017.      Review of Systems   Review of Systems  Constitutional: Negative for activity change, appetite change, chills, diaphoresis, fatigue and fever.  HENT: Negative for mouth sores, postnasal drip, rhinorrhea, sinus pain and sore throat.   Respiratory: Negative for apnea, cough, chest tightness, shortness of breath and wheezing.   Cardiovascular: Negative for chest pain, palpitations and leg swelling.  Gastrointestinal: Negative for abdominal distention, abdominal pain, constipation, diarrhea, nausea and vomiting.  Genitourinary: Negative for dysuria and frequency.  Musculoskeletal: Negative for arthralgias, joint swelling and myalgias.  Skin: Negative for rash.  Neurological: Negative for dizziness, syncope, weakness, light-headedness and numbness.  Psychiatric/Behavioral: Negative for behavioral problems, confusion and sleep disturbance.     Immunization History  Administered Date(s) Administered  . Influenza Whole 10/11/2006  . Influenza-Unspecified 03/19/2009  . Td 01/14/2003   Pertinent  Health Maintenance Due  Topic Date Due  . OPHTHALMOLOGY EXAM  01/04/2017  . INFLUENZA VACCINE  02/25/2017 (Originally 10/26/2016)  . PNA vac Low Risk Adult (1 of 2 - PCV13) 02/25/2017 (Originally 01/04/1989)  . HEMOGLOBIN A1C  05/06/2017  . URINE MICROALBUMIN  10/21/2017  . FOOT EXAM  12/21/2017  . DEXA SCAN  Completed   Fall Risk  12/15/2016  Falls in the past year? No   Functional Status Survey:    Vitals:   01/31/17 1052  BP: 120/74  Pulse: 66  Resp: 17  Temp: 97.9 F (36.6 C)  TempSrc: Oral  SpO2: 98%  Weight: 247 lb 9.6 oz (112.3 kg)  Height: 5\' 9"  (1.753 m)   Body mass index is 36.56 kg/m. Physical Exam  Constitutional: She appears well-developed and well-nourished.  HENT:  Head: Normocephalic.  Mouth/Throat: Oropharynx is clear and moist.  Eyes: Pupils are equal, round, and reactive to light.  Neck: Neck supple.  Cardiovascular: Normal rate, regular rhythm and normal heart sounds.  No murmur  heard. Pulmonary/Chest: Effort normal and breath sounds normal. No respiratory distress. She has no wheezes. She has no rales.  Abdominal: Bowel sounds are normal. She exhibits no distension. There is no tenderness. There is no rebound.  Musculoskeletal:  Patient has moderate amount of edema in both LE but Right more then left significantly.  Neurological: She is alert.  Skin: Skin is warm and dry.  Psychiatric: She has a normal mood and affect. Her behavior is normal. Thought content normal.    Labs reviewed: Recent Labs    09/27/16 0500 11/03/16 0400 11/03/16 1039  NA 142 143 142  K 4.5 4.0 4.0  CL 104 104 103  CO2 30 30 31   GLUCOSE 132* 190* 108*  BUN 18 15 16   CREATININE 1.13* 1.12* 1.11*  CALCIUM 8.7* 8.7* 9.0   Recent Labs    04/27/16 0730 08/31/16 1300  AST 15 16  ALT 9* 10*  ALKPHOS 56 64  BILITOT 0.4 0.3  PROT 6.4* 6.7  ALBUMIN 3.1* 3.3*   Recent Labs    08/31/16 1300 09/02/16 0700 09/27/16 0500 11/04/16 0700  WBC 4.4 5.1 5.0 4.4  NEUTROABS 2.4 2.9 2.7  --   HGB 11.0* 10.7* 11.7* 11.1*  HCT 37.1 37.1 39.8 37.7  MCV 82.3 82.8 82.4 83.4  PLT 230 212 259 230   Lab Results  Component Value Date   TSH 1.175 08/31/2016   Lab Results  Component Value Date   HGBA1C 7.2 (H) 11/03/2016   Lab Results  Component Value Date   CHOL 150 11/26/2016   HDL 45 11/26/2016   LDLCALC 85 11/26/2016   TRIG 99 11/26/2016   CHOLHDL 3.3 11/26/2016    Significant Diagnostic Results in last 30 days:  No results found.  Assessment/Plan Significant weight gain with LE edema and Right more then left Dopplers of both extremities Increase Lasix to 40 mg Repeat labs in 1 week to follow. Weigh every other day.  Diabetes mellitus with peripheral vascular disease  On Metformin. BS stable mostly less then 150 Her A1C is 7.02 in 08/18 Will repeat with labs She had Foot exam and eye exam in 10/18   Essential hypertension BP Controlled on Lisinopril and  Lasix  Peripheral vascular disease  On Aspirin and Pletal  GERD Change Prilosec to Zantac  Hypothyroidism Repeat TSH was normal in 06/18  Alzheimer's dementia  On Both Aricept and Namenda  Mixed hyperlipidemia On Pravastatin LDL 85 in 09/18  DEXA scan was Tscore -.1.Marland Kitchen12 in 08/18   Family/ staff Communication:   Labs/tests ordered:   Total time spent in this patient care encounter was 45_ minutes; greater than 50% of the visit spent counseling patient, reviewing records , Labs and coordinating care for problems addressed at this encounter.

## 2017-02-01 ENCOUNTER — Other Ambulatory Visit (HOSPITAL_COMMUNITY): Payer: Medicare Other

## 2017-02-07 ENCOUNTER — Encounter (HOSPITAL_COMMUNITY)
Admission: RE | Admit: 2017-02-07 | Discharge: 2017-02-07 | Disposition: A | Discharge status: Home / Self Care | Payer: Medicare Other | Source: Other Acute Inpatient Hospital | Attending: Internal Medicine | Admitting: Internal Medicine

## 2017-02-07 DIAGNOSIS — E785 Hyperlipidemia, unspecified: Secondary | ICD-10-CM | POA: Diagnosis not present

## 2017-02-07 LAB — CBC
HEMATOCRIT: 40.9 % (ref 36.0–46.0)
Hemoglobin: 11.5 g/dL — ABNORMAL LOW (ref 12.0–15.0)
MCH: 23.3 pg — AB (ref 26.0–34.0)
MCHC: 28.1 g/dL — AB (ref 30.0–36.0)
MCV: 82.8 fL (ref 78.0–100.0)
Platelets: 193 10*3/uL (ref 150–400)
RBC: 4.94 MIL/uL (ref 3.87–5.11)
RDW: 15.5 % (ref 11.5–15.5)
WBC: 4.6 10*3/uL (ref 4.0–10.5)

## 2017-02-07 LAB — BASIC METABOLIC PANEL
Anion gap: 6 (ref 5–15)
BUN: 10 mg/dL (ref 6–20)
CHLORIDE: 103 mmol/L (ref 101–111)
CO2: 33 mmol/L — ABNORMAL HIGH (ref 22–32)
CREATININE: 0.98 mg/dL (ref 0.44–1.00)
Calcium: 8.5 mg/dL — ABNORMAL LOW (ref 8.9–10.3)
GFR calc Af Amer: 56 mL/min — ABNORMAL LOW (ref >60)
GFR calc non Af Amer: 48 mL/min — ABNORMAL LOW (ref >60)
Glucose, Bld: 141 mg/dL — ABNORMAL HIGH (ref 65–99)
POTASSIUM: 4.4 mmol/L (ref 3.5–5.1)
SODIUM: 142 mmol/L (ref 135–145)

## 2017-02-07 LAB — TSH: TSH: 1.667 u[IU]/mL (ref 0.350–4.500)

## 2017-02-07 LAB — HEMOGLOBIN A1C
HEMOGLOBIN A1C: 7.5 % — AB (ref 4.8–5.6)
Mean Plasma Glucose: 168.55 mg/dL

## 2017-02-20 ENCOUNTER — Encounter (HOSPITAL_COMMUNITY)
Admission: RE | Admit: 2017-02-20 | Discharge: 2017-02-20 | Disposition: A | Discharge status: Home / Self Care | Payer: Medicare Other | Source: Skilled Nursing Facility | Attending: *Deleted | Admitting: *Deleted

## 2017-02-20 DIAGNOSIS — E785 Hyperlipidemia, unspecified: Secondary | ICD-10-CM | POA: Diagnosis not present

## 2017-02-20 LAB — URINALYSIS, ROUTINE W REFLEX MICROSCOPIC
BACTERIA UA: NONE SEEN
Bilirubin Urine: NEGATIVE
Glucose, UA: NEGATIVE mg/dL
Hgb urine dipstick: NEGATIVE
KETONES UR: NEGATIVE mg/dL
LEUKOCYTES UA: NEGATIVE
Nitrite: NEGATIVE
Protein, ur: 30 mg/dL — AB
Specific Gravity, Urine: 1.018 (ref 1.005–1.030)
pH: 7 (ref 5.0–8.0)

## 2017-02-21 ENCOUNTER — Non-Acute Institutional Stay (SKILLED_NURSING_FACILITY): Payer: Medicare Other | Admitting: Internal Medicine

## 2017-02-21 ENCOUNTER — Encounter: Payer: Self-pay | Admitting: Internal Medicine

## 2017-02-21 DIAGNOSIS — E1151 Type 2 diabetes mellitus with diabetic peripheral angiopathy without gangrene: Secondary | ICD-10-CM

## 2017-02-21 DIAGNOSIS — R600 Localized edema: Secondary | ICD-10-CM

## 2017-02-21 DIAGNOSIS — F0280 Dementia in other diseases classified elsewhere without behavioral disturbance: Secondary | ICD-10-CM | POA: Diagnosis not present

## 2017-02-21 DIAGNOSIS — I10 Essential (primary) hypertension: Secondary | ICD-10-CM

## 2017-02-21 DIAGNOSIS — R410 Disorientation, unspecified: Secondary | ICD-10-CM

## 2017-02-21 DIAGNOSIS — E038 Other specified hypothyroidism: Secondary | ICD-10-CM | POA: Diagnosis not present

## 2017-02-21 DIAGNOSIS — I739 Peripheral vascular disease, unspecified: Secondary | ICD-10-CM | POA: Diagnosis not present

## 2017-02-21 DIAGNOSIS — G309 Alzheimer's disease, unspecified: Secondary | ICD-10-CM

## 2017-02-21 NOTE — Progress Notes (Signed)
Location:   [facility] Room Number: 149/W Place of Service:  SNF [phone]) Provider:  Thurmond Butts, MD  Patient Care Team: Fayrene Helper, MD as PCP - General  Extended Emergency Contact Information Primary Emergency Contact: Valarie Merino Address: [place], [place] of [place] Phone: [phone] Work Phone: [phone] Relation: Daughter  Code Status:  Full Code Goals of care: Advanced Directive information Advanced Directives 02/21/2017  Does Patient Have a Medical Advance Directive? Yes  Type of Advance Directive (No Data)  Does patient want to make changes to medical advance directive? No - Patient declined  Copy of [place] in Chart? -     Chief Complaint  Patient presents with  . Acute Visit    Patient c/o Confusion    HPI:  Pt is a 83 y.o. female seen today for an acute visit for confusion as noted by the Nursing staff. Patient has H/O Dementia, Diabetes Type 2, Hypothyroidism, Peripheral vascular disease and Chronic Venous stasis, Hyperlipidemia. Patient did not have any complains. She did have hoarse voice and looked little tired. No fever or chills. Denies any cough or chest pain or SOB. Nurses say that for last few days she has been more confused. Urine was send and Culture is pending. She does have worsening swelling in her LE and I had increased her Lasix  To 40 mg. Her doppler was negative for any DVT. She still continue with increase weight of 251 lbs which is almost 15 lbs more then few months ago.   Past Medical History:  Diagnosis Date  . Acute cystitis   . Dementia   . Dermatomycosis, unspecified   . DJD (degenerative joint disease)    Spine  . DM (diabetes mellitus) ([facility])   . GERD (gastroesophageal reflux disease)   . History of pulmonary embolism   . History of supraventricular tachycardia   . Hyperlipidemia   . Hypertension   .  Hypothyroidism   . Long term (current) use of anticoagulants    coumadin  . Obesity   . Other malaise and fatigue   . PVD (peripheral vascular disease) ([facility])    Past Surgical History:  Procedure Laterality Date  . ATRIAL ABLATION SURGERY  01/2003   Catheter, to tachic palpitations  . MASTECTOMY  2003   Left  . THYROIDECTOMY, PARTIAL  1962    Allergies  Allergen Reactions  . Contrast Media [Iodinated Diagnostic Agents] Other (See Comments)    Unknown-listed on MAR-patient is not aware of these allergies  . Latex Other (See Comments)    Unknown-listed on MAR-patient is not aware of these allergies    Outpatient Encounter Medications as of 02/21/2017  Medication Sig  . acetaminophen (TYLENOL) 325 MG tablet Take 650 mg by mouth 2 (two) times daily.  Marland Kitchen aspirin 81 MG tablet Take 81 mg by mouth daily.   Roseanne Kaufman [place]-Castor Oil (VENELEX) OINT Apply to sacrum and bilateral buttocks each shift and prn for prevention  . calcium-vitamin D (OSCAL WITH D) 500-200 MG-UNIT per tablet Take 1 tablet by mouth 2 (two) times daily.   . cholecalciferol (VITAMIN D) 1000 units tablet Take 1,000 Units by mouth 2 (two) times daily.   . cilostazol (PLETAL) 50 MG tablet Take 50 mg by mouth 2 (two) times daily.    Marland Kitchen donepezil (ARICEPT) 10 MG tablet Take 10 mg by mouth every evening.   Marland Kitchen  escitalopram (LEXAPRO) 5 MG tablet Take two tablets by mouth at bedtime for mood  . furosemide (LASIX) 20 MG tablet Take 40 mg by mouth daily. Hold for systolic below BP below 841  . gabapentin (NEURONTIN) 300 MG capsule Take 300 mg by mouth 2 (two) times daily.    Marland Kitchen levothyroxine (SYNTHROID, LEVOTHROID) 50 MCG tablet Take 50 mcg by mouth daily.    Marland Kitchen lisinopril (PRINIVIL,ZESTRIL) 2.5 MG tablet Take 2.5 mg by mouth daily.  . memantine (NAMENDA) 10 MG tablet Take one tablet by mouth twice daily  . metFORMIN (GLUCOPHAGE) 500 MG tablet Take 500 mg by mouth 2 (two) times daily with a meal.    . omeprazole (PRILOSEC) 20 MG  capsule Take 20 mg by mouth at bedtime.    . pravastatin (PRAVACHOL) 80 MG tablet Take 80 mg by mouth at bedtime.     No facility-administered encounter medications on file as of 02/21/2017.      Review of Systems  Reason unable to perform ROS: Patietn denies evrything and get upset if u ask her any other questions.    Immunization History  Administered Date(s) Administered  . Influenza Whole 10/11/2006  . Influenza-Unspecified 03/19/2009  . Td 01/14/2003   Pertinent  Health Maintenance Due  Topic Date Due  . INFLUENZA VACCINE  02/25/2017 (Originally 10/26/2016)  . PNA vac Low Risk Adult (1 of 2 - PCV13) 02/25/2017 (Originally 01/04/1989)  . OPHTHALMOLOGY EXAM  03/26/2017 (Originally 01/04/2017)  . HEMOGLOBIN A1C  08/07/2017  . URINE MICROALBUMIN  10/21/2017  . FOOT EXAM  12/21/2017  . DEXA SCAN  Completed   Fall Risk  12/15/2016  Falls in the past year? No   Functional Status Survey:    Vitals:   02/21/17 1043  BP: 131/87  Pulse: 77  Resp: 19  Temp: 98.9 F (37.2 C)  TempSrc: Oral  SpO2: 98%   There is no height or weight on file to calculate BMI. Physical Exam  Constitutional: She appears well-developed and well-nourished.  HENT:  Head: Normocephalic.  Mouth/Throat: Oropharynx is clear and moist.  Eyes: Pupils are equal, round, and reactive to light.  Neck: Neck supple.  Cardiovascular: Normal rate, regular rhythm and normal heart sounds.  No murmur heard. Pulmonary/Chest: Effort normal. No respiratory distress. She has no wheezes. She has rales.  Rales heard in Left lower bases.  Abdominal: Soft. Bowel sounds are normal. She exhibits no distension. There is no tenderness. There is no rebound.  Musculoskeletal:  Has moderate Edema Bilateral.  Lymphadenopathy:    She has no cervical adenopathy.  Neurological: She is alert.  No Focal Deficits  Skin: Skin is warm and dry.  Psychiatric: She has a normal mood and affect. Her behavior is normal. Thought content  normal.    Labs reviewed: Recent Labs    11/03/16 0400 11/03/16 1039 02/07/17 0330  NA 143 142 142  K 4.0 4.0 4.4  CL 104 103 103  CO2 30 31 33*  GLUCOSE 190* 108* 141*  BUN 15 16 10   CREATININE 1.12* 1.11* 0.98  CALCIUM 8.7* 9.0 8.5*   Recent Labs    04/27/16 0730 08/31/16 1300  AST 15 16  ALT 9* 10*  ALKPHOS 56 64  BILITOT 0.4 0.3  PROT 6.4* 6.7  ALBUMIN 3.1* 3.3*   Recent Labs    08/31/16 1300 09/02/16 0700 09/27/16 0500 11/04/16 0700 02/07/17 0330  WBC 4.4 5.1 5.0 4.4 4.6  NEUTROABS 2.4 2.9 2.7  --   --  HGB 11.0* 10.7* 11.7* 11.1* 11.5*  HCT 37.1 37.1 39.8 37.7 40.9  MCV 82.3 82.8 82.4 83.4 82.8  PLT 230 212 259 230 193   Lab Results  Component Value Date   TSH 1.667 02/07/2017   Lab Results  Component Value Date   HGBA1C 7.5 (H) 02/07/2017   Lab Results  Component Value Date   CHOL 150 11/26/2016   HDL 45 11/26/2016   LDLCALC 85 11/26/2016   TRIG 99 11/26/2016   CHOLHDL 3.3 11/26/2016    Significant Diagnostic Results in last 30 days:  US Venous Img Lower Bilateral  Result Date: 01/31/2017 CLINICAL DATA:  Bilateral lower extremity edema. EXAM: BILATERAL LOWER EXTREMITY VENOUS DOPPLER ULTRASOUND TECHNIQUE: Gray-scale sonography as well as color Doppler and duplex ultrasound were performed to evaluate the lower extremity deep venous systems from the level of the common femoral vein and including the common femoral, femoral, profunda femoral, popliteal and calf veins including the posterior tibial, peroneal and gastrocnemius veins when visible. The superficial great saphenous vein was also interrogated. Spectral Doppler was utilized to evaluate flow at rest and with distal augmentation maneuvers in the common femoral, femoral and popliteal veins. Calf veins were not visualized due to body habitus. Compression could not be performed of the femoral veins as patient refused. COMPARISON:  Ultrasound of June 17, 2010. FINDINGS: RIGHT LOWER EXTREMITY  Common Femoral Vein: No evidence of thrombus. Normal compressibility, respiratory phasicity and response to augmentation. Saphenofemoral Junction: No evidence of thrombus. Normal compressibility and flow on color Doppler imaging. Profunda Femoral Vein: No evidence of thrombus. Normal compressibility and flow on color Doppler imaging. Femoral Vein: No evidence of thrombus. Compression could not be performed as patient refused. Popliteal Vein: No evidence of thrombus. Normal compressibility, respiratory phasicity and response to augmentation. Calf Veins: Not visualized due to body habitus. Superficial Great Saphenous Vein: No evidence of thrombus. Normal compressibility. Venous Reflux:  None. Other Findings:  None. LEFT LOWER EXTREMITY Common Femoral Vein: No evidence of thrombus. Normal compressibility, respiratory phasicity and response to augmentation. Saphenofemoral Junction: No evidence of thrombus. Normal compressibility and flow on color Doppler imaging. Profunda Femoral Vein: No evidence of thrombus. Normal compressibility and flow on color Doppler imaging. Femoral Vein: No evidence of thrombus. Compression was not performed as patient refused. Popliteal Vein: No evidence of thrombus. Normal compressibility, respiratory phasicity and response to augmentation. Calf Veins: Not visualized due to body habitus. Superficial Great Saphenous Vein: No evidence of thrombus. Normal compressibility. Venous Reflux:  None. Other Findings:  None. IMPRESSION: Exam is significantly limited as the calf veins could not be visualized due to body habitus. Also, patient refused compression of femoral veins bilaterally due to pain. No definite deep venous thrombosis was noted in the visualized structures. Electronically Signed   By: Marijo Conception, M.D.   On: 01/31/2017 17:26    Assessment/Plan  Change in Mental status Patient seem at baseline to me. Urine analysis is unremarkable. Will order Chest Xray Also will do CBC and  CMP.  Continues to gain weight with swelling of her legs Continue on Lasix 40 mg Await Chest Xray and BNP  Per Nurses her daughter brings lot of Junk food for her. Will d/w the Dietician.  Diabetes mellitus  BS running less then150 mostly A1C was 7.5 in 11/18 Continue same dose of metformin.  Essential hypertension BP stable on Lisinopril and Lasix Peripheral vascular disease (HCC) Stable on aspirin and Pletal Stable with no complains  Hypothyroidism TSH is Normal. In  11/18  Alzheimer's dementia  Patient stable on Aricept and nemenda  Mixed hyperlipidemia Continue Pravastatin. LDL was 85 in 09/18          Family/ staff Communication:   Labs/tests ordered:

## 2017-02-22 ENCOUNTER — Encounter (HOSPITAL_COMMUNITY)
Admission: RE | Admit: 2017-02-22 | Discharge: 2017-02-22 | Disposition: A | Discharge status: Home / Self Care | Payer: Medicare Other | Source: Skilled Nursing Facility | Attending: Internal Medicine | Admitting: Internal Medicine

## 2017-02-22 ENCOUNTER — Encounter: Payer: Self-pay | Admitting: Internal Medicine

## 2017-02-22 DIAGNOSIS — K219 Gastro-esophageal reflux disease without esophagitis: Secondary | ICD-10-CM | POA: Diagnosis present

## 2017-02-22 DIAGNOSIS — F329 Major depressive disorder, single episode, unspecified: Secondary | ICD-10-CM | POA: Diagnosis present

## 2017-02-22 DIAGNOSIS — G629 Polyneuropathy, unspecified: Secondary | ICD-10-CM | POA: Diagnosis present

## 2017-02-22 DIAGNOSIS — G4700 Insomnia, unspecified: Secondary | ICD-10-CM | POA: Diagnosis present

## 2017-02-22 DIAGNOSIS — I739 Peripheral vascular disease, unspecified: Secondary | ICD-10-CM | POA: Insufficient documentation

## 2017-02-22 DIAGNOSIS — I10 Essential (primary) hypertension: Secondary | ICD-10-CM | POA: Diagnosis present

## 2017-02-22 LAB — URINE CULTURE

## 2017-02-22 LAB — CBC WITH DIFFERENTIAL/PLATELET
BASOS PCT: 0 %
Basophils Absolute: 0 10*3/uL (ref 0.0–0.1)
EOS ABS: 0.1 10*3/uL (ref 0.0–0.7)
Eosinophils Relative: 2 %
HCT: 41.7 % (ref 36.0–46.0)
Hemoglobin: 11.4 g/dL — ABNORMAL LOW (ref 12.0–15.0)
Lymphocytes Relative: 18 %
Lymphs Abs: 1.2 10*3/uL (ref 0.7–4.0)
MCH: 22.8 pg — ABNORMAL LOW (ref 26.0–34.0)
MCHC: 27.3 g/dL — AB (ref 30.0–36.0)
MCV: 83.2 fL (ref 78.0–100.0)
MONO ABS: 0.8 10*3/uL (ref 0.1–1.0)
MONOS PCT: 12 %
Neutro Abs: 4.5 10*3/uL (ref 1.7–7.7)
Neutrophils Relative %: 68 %
Platelets: 229 10*3/uL (ref 150–400)
RBC: 5.01 MIL/uL (ref 3.87–5.11)
RDW: 16.3 % — AB (ref 11.5–15.5)
WBC: 6.7 10*3/uL (ref 4.0–10.5)

## 2017-02-22 LAB — COMPREHENSIVE METABOLIC PANEL
ALBUMIN: 3.9 g/dL (ref 3.5–5.0)
ALT: 12 U/L — ABNORMAL LOW (ref 14–54)
ANION GAP: 7 (ref 5–15)
AST: 21 U/L (ref 15–41)
Alkaline Phosphatase: 72 U/L (ref 38–126)
BUN: 13 mg/dL (ref 6–20)
CO2: 34 mmol/L — AB (ref 22–32)
Calcium: 9.4 mg/dL (ref 8.9–10.3)
Chloride: 99 mmol/L — ABNORMAL LOW (ref 101–111)
Creatinine, Ser: 1.12 mg/dL — ABNORMAL HIGH (ref 0.44–1.00)
GFR calc Af Amer: 48 mL/min — ABNORMAL LOW (ref >60)
GFR calc non Af Amer: 41 mL/min — ABNORMAL LOW (ref >60)
GLUCOSE: 149 mg/dL — AB (ref 65–99)
POTASSIUM: 4.4 mmol/L (ref 3.5–5.1)
SODIUM: 140 mmol/L (ref 135–145)
TOTAL PROTEIN: 7.7 g/dL (ref 6.5–8.1)
Total Bilirubin: 0.7 mg/dL (ref 0.3–1.2)

## 2017-02-22 LAB — BRAIN NATRIURETIC PEPTIDE: B Natriuretic Peptide: 410 pg/mL — ABNORMAL HIGH (ref 0.0–100.0)

## 2017-02-22 NOTE — Progress Notes (Signed)
Location:   [facility] Room Number: 149/W Place of Service:  SNF [phone]) Provider:  Tera Helper, MD  Patient Care Team: Fayrene Helper, MD as PCP - General  Extended Emergency Contact Information Primary Emergency Contact: Valarie Merino Address: [place], [place] Phone: [phone] Work Phone: [phone] Relation: Daughter  Code Status:  Full Code Goals of care: Advanced Directive information Advanced Directives 02/22/2017  Does Patient Have a Medical Advance Directive? Yes  Type of Advance Directive (No Data)  Does patient want to make changes to medical advance directive? No - Patient declined  Copy of [place] in Chart? -     Chief Complaint  Patient presents with  . Acute Visit    F/U Chest X-ray    HPI:  Pt is a 83 y.o. female seen today for an acute visit for    Past Medical History:  Diagnosis Date  . Acute cystitis   . Dementia   . Dermatomycosis, unspecified   . DJD (degenerative joint disease)    Spine  . DM (diabetes mellitus) ([place])   . GERD (gastroesophageal reflux disease)   . History of pulmonary embolism   . History of supraventricular tachycardia   . Hyperlipidemia   . Hypertension   . Hypothyroidism   . Long term (current) use of anticoagulants    coumadin  . Obesity   . Other malaise and fatigue   . PVD (peripheral vascular disease) ([place])    Past Surgical History:  Procedure Laterality Date  . ATRIAL ABLATION SURGERY  01/2003   Catheter, to tachic palpitations  . MASTECTOMY  2003   Left  . THYROIDECTOMY, PARTIAL  1962    Allergies  Allergen Reactions  . Contrast Media [Iodinated Diagnostic Agents] Other (See Comments)    Unknown-listed on MAR-patient is not aware of these allergies  . Latex Other (See Comments)    Unknown-listed on MAR-patient is not aware of these allergies    Outpatient Encounter  Medications as of 02/22/2017  Medication Sig  . acetaminophen (TYLENOL) 325 MG tablet Take 650 mg by mouth 2 (two) times daily.  Marland Kitchen aspirin 81 MG tablet Take 81 mg by mouth daily.   Roseanne Kaufman Peru-Castor Oil (VENELEX) OINT Apply to sacrum and bilateral buttocks each shift and prn for prevention  . calcium-vitamin D (OSCAL WITH D) 500-200 MG-UNIT per tablet Take 1 tablet by mouth 2 (two) times daily.   . cholecalciferol (VITAMIN D) 1000 units tablet Take 1,000 Units by mouth 2 (two) times daily.   . cilostazol (PLETAL) 50 MG tablet Take 50 mg by mouth 2 (two) times daily.    Marland Kitchen donepezil (ARICEPT) 10 MG tablet Take 10 mg by mouth every evening.   . escitalopram (LEXAPRO) 5 MG tablet Take one tablets by mouth at bedtime for mood  . furosemide (LASIX) 20 MG tablet Take 40 mg by mouth daily. Hold for systolic below BP below 283  . gabapentin (NEURONTIN) 300 MG capsule Take 300 mg by mouth 2 (two) times daily.    Marland Kitchen levothyroxine (SYNTHROID, LEVOTHROID) 50 MCG tablet Take 50 mcg by mouth daily.    Marland Kitchen lisinopril (PRINIVIL,ZESTRIL) 2.5 MG tablet Take 2.5 mg by mouth daily.  . memantine (NAMENDA) 10 MG tablet Take one tablet by mouth twice daily  . metFORMIN (GLUCOPHAGE) 500 MG tablet Take 500 mg by mouth 2 (two) times  daily with a meal.    . omeprazole (PRILOSEC) 20 MG capsule Take 20 mg by mouth at bedtime.    . pravastatin (PRAVACHOL) 80 MG tablet Take 80 mg by mouth at bedtime.     No facility-administered encounter medications on file as of 02/22/2017.     Review of Systems  Immunization History  Administered Date(s) Administered  . Influenza Whole 10/11/2006  . Influenza-Unspecified 03/19/2009  . Td 01/14/2003   Pertinent  Health Maintenance Due  Topic Date Due  . INFLUENZA VACCINE  02/25/2017 (Originally 10/26/2016)  . PNA vac Low Risk Adult (1 of 2 - PCV13) 02/25/2017 (Originally 01/04/1989)  . OPHTHALMOLOGY EXAM  03/26/2017 (Originally 01/04/2017)  . HEMOGLOBIN A1C  08/07/2017  . URINE  MICROALBUMIN  10/21/2017  . FOOT EXAM  12/21/2017  . DEXA SCAN  Completed   Fall Risk  12/15/2016  Falls in the past year? No   Functional Status Survey:    Vitals:   02/22/17 1043  BP: 132/70  Pulse: 82  Resp: 20  Temp: 98 F (36.7 C)  TempSrc: Oral   There is no height or weight on file to calculate BMI. Physical Exam  Labs reviewed: Recent Labs    11/03/16 1039 02/07/17 0330 02/22/17 0708  NA 142 142 140  K 4.0 4.4 4.4  CL 103 103 99*  CO2 31 33* 34*  GLUCOSE 108* 141* 149*  BUN 16 10 13   CREATININE 1.11* 0.98 1.12*  CALCIUM 9.0 8.5* 9.4   Recent Labs    04/27/16 0730 08/31/16 1300 02/22/17 0708  AST 15 16 21   ALT 9* 10* 12*  ALKPHOS 56 64 72  BILITOT 0.4 0.3 0.7  PROT 6.4* 6.7 7.7  ALBUMIN 3.1* 3.3* 3.9   Recent Labs    09/02/16 0700 09/27/16 0500 11/04/16 0700 02/07/17 0330 02/22/17 0708  WBC 5.1 5.0 4.4 4.6 6.7  NEUTROABS 2.9 2.7  --   --  4.5  HGB 10.7* 11.7* 11.1* 11.5* 11.4*  HCT 37.1 39.8 37.7 40.9 41.7  MCV 82.8 82.4 83.4 82.8 83.2  PLT 212 259 230 193 229   Lab Results  Component Value Date   TSH 1.667 02/07/2017   Lab Results  Component Value Date   HGBA1C 7.5 (H) 02/07/2017   Lab Results  Component Value Date   CHOL 150 11/26/2016   HDL 45 11/26/2016   LDLCALC 85 11/26/2016   TRIG 99 11/26/2016   CHOLHDL 3.3 11/26/2016    Significant Diagnostic Results in last 30 days:  US Venous Img Lower Bilateral  Result Date: 01/31/2017 CLINICAL DATA:  Bilateral lower extremity edema. EXAM: BILATERAL LOWER EXTREMITY VENOUS DOPPLER ULTRASOUND TECHNIQUE: Gray-scale sonography as well as color Doppler and duplex ultrasound were performed to evaluate the lower extremity deep venous systems from the level of the common femoral vein and including the common femoral, femoral, profunda femoral, popliteal and calf veins including the posterior tibial, peroneal and gastrocnemius veins when visible. The superficial great saphenous vein was also  interrogated. Spectral Doppler was utilized to evaluate flow at rest and with distal augmentation maneuvers in the common femoral, femoral and popliteal veins. Calf veins were not visualized due to body habitus. Compression could not be performed of the femoral veins as patient refused. COMPARISON:  Ultrasound of June 17, 2010. FINDINGS: RIGHT LOWER EXTREMITY Common Femoral Vein: No evidence of thrombus. Normal compressibility, respiratory phasicity and response to augmentation. Saphenofemoral Junction: No evidence of thrombus. Normal compressibility and flow on color Doppler imaging. Profunda  Femoral Vein: No evidence of thrombus. Normal compressibility and flow on color Doppler imaging. Femoral Vein: No evidence of thrombus. Compression could not be performed as patient refused. Popliteal Vein: No evidence of thrombus. Normal compressibility, respiratory phasicity and response to augmentation. Calf Veins: Not visualized due to body habitus. Superficial Great Saphenous Vein: No evidence of thrombus. Normal compressibility. Venous Reflux:  None. Other Findings:  None. LEFT LOWER EXTREMITY Common Femoral Vein: No evidence of thrombus. Normal compressibility, respiratory phasicity and response to augmentation. Saphenofemoral Junction: No evidence of thrombus. Normal compressibility and flow on color Doppler imaging. Profunda Femoral Vein: No evidence of thrombus. Normal compressibility and flow on color Doppler imaging. Femoral Vein: No evidence of thrombus. Compression was not performed as patient refused. Popliteal Vein: No evidence of thrombus. Normal compressibility, respiratory phasicity and response to augmentation. Calf Veins: Not visualized due to body habitus. Superficial Great Saphenous Vein: No evidence of thrombus. Normal compressibility. Venous Reflux:  None. Other Findings:  None. IMPRESSION: Exam is significantly limited as the calf veins could not be visualized due to body habitus. Also, patient  refused compression of femoral veins bilaterally due to pain. No definite deep venous thrombosis was noted in the visualized structures. Electronically Signed   By: Marijo Conception, M.D.   On: 01/31/2017 17:26    Assessment/Plan There are no diagnoses linked to this encounter.      Oralia Manis, [place] [phone]  This encounter was created in error - please disregard.

## 2017-02-23 ENCOUNTER — Non-Acute Institutional Stay (SKILLED_NURSING_FACILITY): Payer: Medicare Other | Admitting: Internal Medicine

## 2017-02-23 ENCOUNTER — Encounter: Payer: Self-pay | Admitting: Internal Medicine

## 2017-02-23 DIAGNOSIS — R600 Localized edema: Secondary | ICD-10-CM | POA: Diagnosis not present

## 2017-02-23 DIAGNOSIS — E1151 Type 2 diabetes mellitus with diabetic peripheral angiopathy without gangrene: Secondary | ICD-10-CM | POA: Diagnosis not present

## 2017-02-23 DIAGNOSIS — I10 Essential (primary) hypertension: Secondary | ICD-10-CM

## 2017-02-23 NOTE — Progress Notes (Signed)
Location:   [facility] Room Number: 149/W Place of Service:  SNF [phone]) Provider:  Thurmond Butts, MD  Patient Care Team: Fayrene Helper, MD as PCP - General  Extended Emergency Contact Information Primary Emergency Contact: Valarie Merino Address: [place], [place] of [place] Phone: [phone] Work Phone: [phone] Relation: Daughter  Code Status:  Full Code Goals of care: Advanced Directive information Advanced Directives 02/23/2017  Does Patient Have a Medical Advance Directive? Yes  Type of Advance Directive (No Data)  Does patient want to make changes to medical advance directive? No - Patient declined  Copy of [place] in Chart? -     Chief Complaint  Patient presents with  . Acute Visit    Weight Gain    HPI:  Pt is a 83 y.o. female seen today for an acute visit for follow-up for her confusion and weight gain. Patient has H/O Dementia, Diabetes Type 2, Hypothyroidism, Peripheral vascular disease and Chronic Venous stasis, Hyperlipidemia.  Patient was recently seen for increased confusion as her nurses.  During that visit I also noticed that patient has gained almost 15 pounds in the past few months. Her weight was 251 pounds.  She also had some labs done which were mostly normal and her urine culture did not show any infection. Her chest x-ray done in the facility did not show any signs of infection heart failure but her BNP was elevated at 410.  Patient continues to have a cough but no chest pain or shortness of breath.  Her pulse ox has been stable without any supplement oxygen. Patient denied any other complaint today. Her weight today was 247 pounds.   Past Medical History:  Diagnosis Date  . Acute cystitis   . Dementia   . Dermatomycosis, unspecified   . DJD (degenerative joint disease)    Spine  . DM (diabetes mellitus) ([place])   .  GERD (gastroesophageal reflux disease)   . History of pulmonary embolism   . History of supraventricular tachycardia   . Hyperlipidemia   . Hypertension   . Hypothyroidism   . Long term (current) use of anticoagulants    coumadin  . Obesity   . Other malaise and fatigue   . PVD (peripheral vascular disease) ([place])    Past Surgical History:  Procedure Laterality Date  . ATRIAL ABLATION SURGERY  01/2003   Catheter, to tachic palpitations  . MASTECTOMY  2003   Left  . THYROIDECTOMY, PARTIAL  1962    Allergies  Allergen Reactions  . Contrast Media [Iodinated Diagnostic Agents] Other (See Comments)    Unknown-listed on MAR-patient is not aware of these allergies  . Latex Other (See Comments)    Unknown-listed on MAR-patient is not aware of these allergies    Outpatient Encounter Medications as of 02/23/2017  Medication Sig  . acetaminophen (TYLENOL) 325 MG tablet Take 650 mg by mouth 2 (two) times daily.  Marland Kitchen aspirin 81 MG tablet Take 81 mg by mouth daily.   Roseanne Kaufman [place]-Castor Oil (VENELEX) OINT Apply to sacrum and bilateral buttocks each shift and prn for prevention  . calcium-vitamin D (OSCAL WITH D) 500-200 MG-UNIT per tablet Take 1 tablet by mouth 2 (two) times daily.   . cholecalciferol (VITAMIN D) 1000 units tablet Take 1,000 Units by mouth 2 (two) times daily.   . cilostazol (PLETAL) 50 MG tablet Take  50 mg by mouth 2 (two) times daily.    Marland Kitchen donepezil (ARICEPT) 10 MG tablet Take 10 mg by mouth every evening.   . escitalopram (LEXAPRO) 5 MG tablet Take one tablets by mouth at bedtime for mood  . furosemide (LASIX) 20 MG tablet Take 40 mg by mouth daily. Hold for systolic below BP below 856  . gabapentin (NEURONTIN) 300 MG capsule Take 300 mg by mouth 2 (two) times daily.    Marland Kitchen levothyroxine (SYNTHROID, LEVOTHROID) 50 MCG tablet Take 50 mcg by mouth daily.    Marland Kitchen lisinopril (PRINIVIL,ZESTRIL) 2.5 MG tablet Take 2.5 mg by mouth daily.  . memantine (NAMENDA) 10 MG tablet Take one  tablet by mouth twice daily  . metFORMIN (GLUCOPHAGE) 500 MG tablet Take 500 mg by mouth 2 (two) times daily with a meal.    . omeprazole (PRILOSEC) 20 MG capsule Take 20 mg by mouth at bedtime.    . pravastatin (PRAVACHOL) 80 MG tablet Take 80 mg by mouth at bedtime.     No facility-administered encounter medications on file as of 02/23/2017.      Review of Systems  Reason unable to perform ROS: Patient Denies everything. Not reliable due to Dementia.    Immunization History  Administered Date(s) Administered  . Influenza Whole 10/11/2006  . Influenza-Unspecified 03/19/2009  . Td 01/14/2003   Pertinent  Health Maintenance Due  Topic Date Due  . INFLUENZA VACCINE  02/25/2017 (Originally 10/26/2016)  . PNA vac Low Risk Adult (1 of 2 - PCV13) 02/25/2017 (Originally 01/04/1989)  . HEMOGLOBIN A1C  08/07/2017  . URINE MICROALBUMIN  10/21/2017  . FOOT EXAM  12/21/2017  . OPHTHALMOLOGY EXAM  01/17/2018  . DEXA SCAN  Completed   Fall Risk  12/15/2016  Falls in the past year? No   Functional Status Survey:    Vitals:   02/24/17 1149  BP: 132/70  Pulse: 82  Resp: 20  Temp: 98.5 F (36.9 C)   There is no height or weight on file to calculate BMI. Physical Exam  Constitutional: She appears well-developed and well-nourished.  HENT:  Head: Normocephalic.  Mouth/Throat: Oropharynx is clear and moist.  Eyes: Pupils are equal, round, and reactive to light.  Neck: Neck supple.  Cardiovascular: Normal rate and normal heart sounds.  No murmur heard. Pulmonary/Chest: Effort normal and breath sounds normal. No respiratory distress. She has no wheezes. She has no rales.  Abdominal: Soft. Bowel sounds are normal. She exhibits no distension. There is no tenderness. There is no rebound.  Musculoskeletal:  Moderate Edema Bilateral  Lymphadenopathy:    She has no cervical adenopathy.  Neurological: She is alert.  Skin: Skin is warm and dry.  Psychiatric: She has a normal mood and  affect. Her behavior is normal.    Labs reviewed: Recent Labs    11/03/16 1039 02/07/17 0330 02/22/17 0708  NA 142 142 140  K 4.0 4.4 4.4  CL 103 103 99*  CO2 31 33* 34*  GLUCOSE 108* 141* 149*  BUN 16 10 13   CREATININE 1.11* 0.98 1.12*  CALCIUM 9.0 8.5* 9.4   Recent Labs    04/27/16 0730 08/31/16 1300 02/22/17 0708  AST 15 16 21   ALT 9* 10* 12*  ALKPHOS 56 64 72  BILITOT 0.4 0.3 0.7  PROT 6.4* 6.7 7.7  ALBUMIN 3.1* 3.3* 3.9   Recent Labs    09/02/16 0700 09/27/16 0500 11/04/16 0700 02/07/17 0330 02/22/17 0708  WBC 5.1 5.0 4.4 4.6 6.7  NEUTROABS  2.9 2.7  --   --  4.5  HGB 10.7* 11.7* 11.1* 11.5* 11.4*  HCT 37.1 39.8 37.7 40.9 41.7  MCV 82.8 82.4 83.4 82.8 83.2  PLT 212 259 230 193 229   Lab Results  Component Value Date   TSH 1.667 02/07/2017   Lab Results  Component Value Date   HGBA1C 7.5 (H) 02/07/2017   Lab Results  Component Value Date   CHOL 150 11/26/2016   HDL 45 11/26/2016   LDLCALC 85 11/26/2016   TRIG 99 11/26/2016   CHOLHDL 3.3 11/26/2016    Significant Diagnostic Results in last 30 days:  US Venous Img Lower Bilateral  Result Date: 01/31/2017 CLINICAL DATA:  Bilateral lower extremity edema. EXAM: BILATERAL LOWER EXTREMITY VENOUS DOPPLER ULTRASOUND TECHNIQUE: Gray-scale sonography as well as color Doppler and duplex ultrasound were performed to evaluate the lower extremity deep venous systems from the level of the common femoral vein and including the common femoral, femoral, profunda femoral, popliteal and calf veins including the posterior tibial, peroneal and gastrocnemius veins when visible. The superficial great saphenous vein was also interrogated. Spectral Doppler was utilized to evaluate flow at rest and with distal augmentation maneuvers in the common femoral, femoral and popliteal veins. Calf veins were not visualized due to body habitus. Compression could not be performed of the femoral veins as patient refused. COMPARISON:   Ultrasound of June 17, 2010. FINDINGS: RIGHT LOWER EXTREMITY Common Femoral Vein: No evidence of thrombus. Normal compressibility, respiratory phasicity and response to augmentation. Saphenofemoral Junction: No evidence of thrombus. Normal compressibility and flow on color Doppler imaging. Profunda Femoral Vein: No evidence of thrombus. Normal compressibility and flow on color Doppler imaging. Femoral Vein: No evidence of thrombus. Compression could not be performed as patient refused. Popliteal Vein: No evidence of thrombus. Normal compressibility, respiratory phasicity and response to augmentation. Calf Veins: Not visualized due to body habitus. Superficial Great Saphenous Vein: No evidence of thrombus. Normal compressibility. Venous Reflux:  None. Other Findings:  None. LEFT LOWER EXTREMITY Common Femoral Vein: No evidence of thrombus. Normal compressibility, respiratory phasicity and response to augmentation. Saphenofemoral Junction: No evidence of thrombus. Normal compressibility and flow on color Doppler imaging. Profunda Femoral Vein: No evidence of thrombus. Normal compressibility and flow on color Doppler imaging. Femoral Vein: No evidence of thrombus. Compression was not performed as patient refused. Popliteal Vein: No evidence of thrombus. Normal compressibility, respiratory phasicity and response to augmentation. Calf Veins: Not visualized due to body habitus. Superficial Great Saphenous Vein: No evidence of thrombus. Normal compressibility. Venous Reflux:  None. Other Findings:  None. IMPRESSION: Exam is significantly limited as the calf veins could not be visualized due to body habitus. Also, patient refused compression of femoral veins bilaterally due to pain. No definite deep venous thrombosis was noted in the visualized structures. Electronically Signed   By: Marijo Conception, M.D.   On: 01/31/2017 17:26    Assessment/Plan Lower extremity edema bilateral with weight gain Patient had Dopplers  done and they were negative. She has lost some weight since I increased her Lasix.  Will increase her Lasix to 60 mg.  Continue to watch her weight. I have discussed with the dietitian and her nurses.  It seems like she gets lot of junk food from outside. Repeat Bmp in 1 week. So Far Her BUN and Creat has been stable.    Family/ staff Communication:   Labs/tests ordered:   Total time spent in this patient care encounter was 25_ minutes; greater  than 50% of the visit spent counseling patient, reviewing records , Labs and coordinating care for problems addressed at this encounter.

## 2017-02-24 ENCOUNTER — Encounter: Payer: Self-pay | Admitting: Internal Medicine

## 2017-02-24 ENCOUNTER — Encounter (HOSPITAL_COMMUNITY)
Admission: RE | Admit: 2017-02-24 | Discharge: 2017-02-24 | Disposition: A | Discharge status: Home / Self Care | Payer: Self-pay | Source: Skilled Nursing Facility | Attending: *Deleted | Admitting: *Deleted

## 2017-02-24 DIAGNOSIS — N3000 Acute cystitis without hematuria: Secondary | ICD-10-CM

## 2017-02-24 LAB — URINALYSIS, ROUTINE W REFLEX MICROSCOPIC
Bacteria, UA: NONE SEEN
Glucose, UA: NEGATIVE mg/dL
Hgb urine dipstick: NEGATIVE
Ketones, ur: NEGATIVE mg/dL
Nitrite: NEGATIVE
Protein, ur: 30 mg/dL — AB
Specific Gravity, Urine: 1.028 (ref 1.005–1.030)
pH: 6 (ref 5.0–8.0)

## 2017-02-26 LAB — URINE CULTURE

## 2017-03-01 ENCOUNTER — Encounter (HOSPITAL_COMMUNITY)
Admission: RE | Admit: 2017-03-01 | Discharge: 2017-03-01 | Disposition: A | Discharge status: Home / Self Care | Payer: Medicare Other | Source: Skilled Nursing Facility | Attending: Internal Medicine | Admitting: Internal Medicine

## 2017-03-01 DIAGNOSIS — F329 Major depressive disorder, single episode, unspecified: Secondary | ICD-10-CM | POA: Insufficient documentation

## 2017-03-01 DIAGNOSIS — G629 Polyneuropathy, unspecified: Secondary | ICD-10-CM | POA: Diagnosis not present

## 2017-03-01 DIAGNOSIS — G4700 Insomnia, unspecified: Secondary | ICD-10-CM | POA: Diagnosis present

## 2017-03-01 DIAGNOSIS — R4182 Altered mental status, unspecified: Secondary | ICD-10-CM | POA: Diagnosis not present

## 2017-03-01 DIAGNOSIS — I739 Peripheral vascular disease, unspecified: Secondary | ICD-10-CM | POA: Diagnosis not present

## 2017-03-01 DIAGNOSIS — I10 Essential (primary) hypertension: Secondary | ICD-10-CM | POA: Insufficient documentation

## 2017-03-01 DIAGNOSIS — K219 Gastro-esophageal reflux disease without esophagitis: Secondary | ICD-10-CM | POA: Diagnosis not present

## 2017-03-01 LAB — URINALYSIS, ROUTINE W REFLEX MICROSCOPIC
Bilirubin Urine: NEGATIVE
Glucose, UA: NEGATIVE mg/dL
KETONES UR: NEGATIVE mg/dL
Nitrite: POSITIVE — AB
PH: 5 (ref 5.0–8.0)
Protein, ur: NEGATIVE mg/dL
SPECIFIC GRAVITY, URINE: 1.014 (ref 1.005–1.030)

## 2017-03-01 LAB — CBC
HCT: 41.4 % (ref 36.0–46.0)
Hemoglobin: 11.2 g/dL — ABNORMAL LOW (ref 12.0–15.0)
MCH: 22.6 pg — AB (ref 26.0–34.0)
MCHC: 27.1 g/dL — AB (ref 30.0–36.0)
MCV: 83.5 fL (ref 78.0–100.0)
PLATELETS: 241 10*3/uL (ref 150–400)
RBC: 4.96 MIL/uL (ref 3.87–5.11)
RDW: 16.6 % — AB (ref 11.5–15.5)
WBC: 5.8 10*3/uL (ref 4.0–10.5)

## 2017-03-01 LAB — BASIC METABOLIC PANEL
Anion gap: 8 (ref 5–15)
BUN: 17 mg/dL (ref 6–20)
CALCIUM: 8.7 mg/dL — AB (ref 8.9–10.3)
CO2: 33 mmol/L — ABNORMAL HIGH (ref 22–32)
CREATININE: 1.09 mg/dL — AB (ref 0.44–1.00)
Chloride: 102 mmol/L (ref 101–111)
GFR calc Af Amer: 49 mL/min — ABNORMAL LOW (ref >60)
GFR, EST NON AFRICAN AMERICAN: 42 mL/min — AB (ref >60)
Glucose, Bld: 134 mg/dL — ABNORMAL HIGH (ref 65–99)
Potassium: 4.3 mmol/L (ref 3.5–5.1)
SODIUM: 143 mmol/L (ref 135–145)

## 2017-03-02 ENCOUNTER — Encounter (HOSPITAL_COMMUNITY)
Admission: RE | Admit: 2017-03-02 | Discharge: 2017-03-02 | Disposition: A | Discharge status: Home / Self Care | Payer: Medicare Other | Source: Skilled Nursing Facility | Attending: Internal Medicine | Admitting: Internal Medicine

## 2017-03-02 DIAGNOSIS — F329 Major depressive disorder, single episode, unspecified: Secondary | ICD-10-CM | POA: Insufficient documentation

## 2017-03-02 DIAGNOSIS — I739 Peripheral vascular disease, unspecified: Secondary | ICD-10-CM | POA: Insufficient documentation

## 2017-03-02 DIAGNOSIS — G4700 Insomnia, unspecified: Secondary | ICD-10-CM | POA: Insufficient documentation

## 2017-03-02 DIAGNOSIS — K219 Gastro-esophageal reflux disease without esophagitis: Secondary | ICD-10-CM | POA: Insufficient documentation

## 2017-03-02 DIAGNOSIS — E119 Type 2 diabetes mellitus without complications: Secondary | ICD-10-CM | POA: Insufficient documentation

## 2017-03-02 DIAGNOSIS — I10 Essential (primary) hypertension: Secondary | ICD-10-CM | POA: Insufficient documentation

## 2017-03-02 DIAGNOSIS — G629 Polyneuropathy, unspecified: Secondary | ICD-10-CM | POA: Insufficient documentation

## 2017-03-05 LAB — URINE CULTURE: Culture: >=100000 — AB

## 2017-03-25 ENCOUNTER — Encounter: Payer: Self-pay | Admitting: Internal Medicine

## 2017-03-25 NOTE — Progress Notes (Signed)
Location:   [facility] Room Number: 149/W Place of Service:  SNF [phone]) Provider:  Tera Helper, MD  Patient Care Team: Fayrene Helper, MD as PCP - General  Extended Emergency Contact Information Primary Emergency Contact: Valarie Merino Address: [place], [place] of [place] Phone: [phone] Work Phone: [phone] Relation: Daughter  Code Status:  Full Code Goals of care: Advanced Directive information Advanced Directives 03/25/2017  Does Patient Have a Medical Advance Directive? Yes  Type of Advance Directive (No Data)  Does patient want to make changes to medical advance directive? No - Patient declined  Copy of [place] in Chart? -     Chief Complaint  Patient presents with  . Medical Management of Chronic Issues    Routine Visit    HPI:  Pt is a 83 y.o. female seen today for medical management of chronic diseases.     Past Medical History:  Diagnosis Date  . Acute cystitis   . Dementia   . Dermatomycosis, unspecified   . DJD (degenerative joint disease)    Spine  . DM (diabetes mellitus) ([place])   . GERD (gastroesophageal reflux disease)   . History of pulmonary embolism   . History of supraventricular tachycardia   . Hyperlipidemia   . Hypertension   . Hypothyroidism   . Long term (current) use of anticoagulants    coumadin  . Obesity   . Other malaise and fatigue   . PVD (peripheral vascular disease) ([place])    Past Surgical History:  Procedure Laterality Date  . ATRIAL ABLATION SURGERY  01/2003   Catheter, to tachic palpitations  . MASTECTOMY  2003   Left  . THYROIDECTOMY, PARTIAL  1962    Allergies  Allergen Reactions  . Contrast Media [Iodinated Diagnostic Agents] Other (See Comments)    Unknown-listed on MAR-patient is not aware of these allergies  . Latex Other (See Comments)    Unknown-listed on MAR-patient is not aware  of these allergies    Outpatient Encounter Medications as of 03/25/2017  Medication Sig  . acetaminophen (TYLENOL) 325 MG tablet Take 650 mg by mouth 2 (two) times daily.  Marland Kitchen aspirin 81 MG tablet Take 81 mg by mouth daily.   Roseanne Kaufman Peru-Castor Oil (VENELEX) OINT Apply to sacrum and bilateral buttocks each shift and prn for prevention  . calcium-vitamin D (OSCAL WITH D) 500-200 MG-UNIT per tablet Take 1 tablet by mouth 2 (two) times daily.   . cholecalciferol (VITAMIN D) 1000 units tablet Take 1,000 Units by mouth 2 (two) times daily.   . cilostazol (PLETAL) 50 MG tablet Take 50 mg by mouth 2 (two) times daily.    Marland Kitchen donepezil (ARICEPT) 10 MG tablet Take 10 mg by mouth every evening.   . escitalopram (LEXAPRO) 5 MG tablet Take one tablets by mouth at bedtime for mood  . furosemide (LASIX) 20 MG tablet Take 60 mg by mouth daily. Hold for systolic below BP below 315  . gabapentin (NEURONTIN) 300 MG capsule Take 300 mg by mouth 2 (two) times daily.    Marland Kitchen levothyroxine (SYNTHROID, LEVOTHROID) 50 MCG tablet Take 50 mcg by mouth daily.    Marland Kitchen lisinopril (PRINIVIL,ZESTRIL) 2.5 MG tablet Take 2.5 mg by mouth daily.  . memantine (NAMENDA) 10 MG tablet Take one tablet by mouth twice daily  . metFORMIN (GLUCOPHAGE) 500 MG tablet Take 500 mg by  mouth 2 (two) times daily with a meal.    . omeprazole (PRILOSEC) 20 MG capsule Take 20 mg by mouth at bedtime.    . pravastatin (PRAVACHOL) 80 MG tablet Take 80 mg by mouth at bedtime.     No facility-administered encounter medications on file as of 03/25/2017.      Review of Systems  Immunization History  Administered Date(s) Administered  . Influenza Whole 10/11/2006  . Influenza-Unspecified 03/19/2009, 12/30/2016  . Pneumococcal Conjugate-13 12/30/2016  . Td 01/14/2003  . Tdap 12/15/2016   Pertinent  Health Maintenance Due  Topic Date Due  . HEMOGLOBIN A1C  08/07/2017  . URINE MICROALBUMIN  10/21/2017  . FOOT EXAM  12/21/2017  . PNA vac Low Risk  Adult (2 of 2 - PPSV23) 12/30/2017  . OPHTHALMOLOGY EXAM  01/17/2018  . INFLUENZA VACCINE  Completed  . DEXA SCAN  Completed   Fall Risk  12/15/2016  Falls in the past year? No   Functional Status Survey:    Vitals:   03/25/17 1026  BP: 134/81  Pulse: 69  Resp: 20  Temp: (!) 97 F (36.1 C)  TempSrc: Oral  SpO2: 98%  Weight: 246 lb 9.6 oz (111.9 kg)  Height: 5\' 9"  (1.753 m)   Body mass index is 36.42 kg/m. Physical Exam  Labs reviewed: Recent Labs    02/07/17 0330 02/22/17 0708 03/01/17 0706  NA 142 140 143  K 4.4 4.4 4.3  CL 103 99* 102  CO2 33* 34* 33*  GLUCOSE 141* 149* 134*  BUN 10 13 17   CREATININE 0.98 1.12* 1.09*  CALCIUM 8.5* 9.4 8.7*   Recent Labs    04/27/16 0730 08/31/16 1300 02/22/17 0708  AST 15 16 21   ALT 9* 10* 12*  ALKPHOS 56 64 72  BILITOT 0.4 0.3 0.7  PROT 6.4* 6.7 7.7  ALBUMIN 3.1* 3.3* 3.9   Recent Labs    09/02/16 0700 09/27/16 0500  02/07/17 0330 02/22/17 0708 03/01/17 0706  WBC 5.1 5.0   < > 4.6 6.7 5.8  NEUTROABS 2.9 2.7  --   --  4.5  --   HGB 10.7* 11.7*   < > 11.5* 11.4* 11.2*  HCT 37.1 39.8   < > 40.9 41.7 41.4  MCV 82.8 82.4   < > 82.8 83.2 83.5  PLT 212 259   < > 193 229 241   < > = values in this interval not displayed.   Lab Results  Component Value Date   TSH 1.667 02/07/2017   Lab Results  Component Value Date   HGBA1C 7.5 (H) 02/07/2017   Lab Results  Component Value Date   CHOL 150 11/26/2016   HDL 45 11/26/2016   LDLCALC 85 11/26/2016   TRIG 99 11/26/2016   CHOLHDL 3.3 11/26/2016    Significant Diagnostic Results in last 30 days:  No results found.  Assessment/Plan There are no diagnoses linked to this encounter.   Family/ staff Communication:   Labs/tests ordered:      This encounter was created in error - please disregard.

## 2017-03-26 ENCOUNTER — Non-Acute Institutional Stay (SKILLED_NURSING_FACILITY): Payer: Medicare Other | Admitting: Internal Medicine

## 2017-03-26 ENCOUNTER — Encounter: Payer: Self-pay | Admitting: Internal Medicine

## 2017-03-26 DIAGNOSIS — R059 Cough, unspecified: Secondary | ICD-10-CM

## 2017-03-26 DIAGNOSIS — R635 Abnormal weight gain: Secondary | ICD-10-CM

## 2017-03-26 DIAGNOSIS — R05 Cough: Secondary | ICD-10-CM | POA: Diagnosis not present

## 2017-03-26 DIAGNOSIS — E1151 Type 2 diabetes mellitus with diabetic peripheral angiopathy without gangrene: Secondary | ICD-10-CM

## 2017-03-26 DIAGNOSIS — I739 Peripheral vascular disease, unspecified: Secondary | ICD-10-CM

## 2017-03-26 DIAGNOSIS — G309 Alzheimer's disease, unspecified: Secondary | ICD-10-CM

## 2017-03-26 DIAGNOSIS — F0280 Dementia in other diseases classified elsewhere without behavioral disturbance: Secondary | ICD-10-CM

## 2017-03-26 DIAGNOSIS — I10 Essential (primary) hypertension: Secondary | ICD-10-CM | POA: Diagnosis not present

## 2017-03-26 NOTE — Progress Notes (Signed)
Location:   [facility] Room Number: 149/W Place of Service:  SNF [phone]) Provider:  Tera Helper, MD [place] Patient Care Team: Fayrene Helper, MD as PCP - General  Extended Emergency Contact Information Primary Emergency Contact: Valarie Merino Address: [place], [place] Phone: [phone] Work Phone: [phone] Relation: Daughter  Code Status:  Full Code Goals of care: Advanced Directive information Advanced Directives 03/26/2017  Does Patient Have a Medical Advance Directive? Yes  Type of Advance Directive (No Data)  Does patient want to make changes to medical advance directive? No - Patient declined  Copy of [place] in Chart? -     Chief Complaint  Patient presents with  . Medical Management of Chronic Issues    Routine Visit  . Acute Visit    Patients c/o Cough with Mucus  Medical management includes management of edema-dementia-diabetes type 2- hypothyroidism-peripheral vascular disease- hyperlipidemia-hypertension-   HPI:  Pt is a 83 y.o. female seen today for medical management of chronic diseases.  As noted above as well as acute visit secondary to cough.  Nursing has noted a cough have asked me to follow-up-patient does not express any concerns-but she does have a previous history of a respiratory infection and actually had pneumonia back in June she responded well to a course of Augmentin as well as low-dose prednisone.  She did not complain of any shortness of breath I do not appreciate any wheezing on exam today.  She is also been seen recently for weight gain this is thought to be partly appetite related since heshewacks quite a bit--but she is also had increased edema and Lasix been increased since then her weight appears to have stabilized- she is currently on Lasix 60 mg a day renal function appears to be stable but will update  this.  Her BNP was somewhat elevated at over 400 in late November.  Of note venous Dopplers were negative for any DVT.  She also has a history of type 2 diabetes which appears to be well controlled on Glucophage blood sugars largely in the mid to lower 100s hemoglobin A1c was 7.5 in November.  She also has a history of peripheral vascular disease and continues on aspirin and Pletal she is not really complaining of discomfort at this time.  In regards to hypothyroidism she is on Synthroid and this is been stable for some time TSH was within normal range at 1.667 in November.  She also is on lisinopril for a history of hypertension which appears to be well controlled recent blood pressure is 135/80 128/79-134/81.  In regards to hyperlipidemia she is on a statin LDL is been satisfactory with 85 back in September 2018.  Of note she was recently treated for UTI as well she is not complaining any dysuria she completed her antibiotic earlier this month-confusion apparently has resolved she does have some baseline confusion with her history of mild dementia she is on Aricept and Namenda.         Past Medical History:  Diagnosis Date  . Acute cystitis   . Dementia   . Dermatomycosis, unspecified   . DJD (degenerative joint disease)    Spine  . DM (diabetes mellitus) ([place])   . GERD (gastroesophageal reflux disease)   . History of pulmonary embolism   . History of supraventricular tachycardia   . Hyperlipidemia   .  Hypertension   . Hypothyroidism   . Long term (current) use of anticoagulants    coumadin  . Obesity   . Other malaise and fatigue   . PVD (peripheral vascular disease) ([place])    Past Surgical History:  Procedure Laterality Date  . ATRIAL ABLATION SURGERY  01/2003   Catheter, to tachic palpitations  . MASTECTOMY  2003   Left  . THYROIDECTOMY, PARTIAL  1962    Allergies  Allergen Reactions  . Contrast Media [Iodinated Diagnostic Agents] Other (See Comments)     Unknown-listed on MAR-patient is not aware of these allergies  . Latex Other (See Comments)    Unknown-listed on MAR-patient is not aware of these allergies    Outpatient Encounter Medications as of 03/26/2017  Medication Sig  . acetaminophen (TYLENOL) 325 MG tablet Take 650 mg by mouth 2 (two) times daily.  Marland Kitchen aspirin 81 MG tablet Take 81 mg by mouth daily.   Roseanne Kaufman [place]-Castor Oil (VENELEX) OINT Apply to sacrum and bilateral buttocks each shift and prn for prevention  . calcium-vitamin D (OSCAL WITH D) 500-200 MG-UNIT per tablet Take 1 tablet by mouth 2 (two) times daily.   . cholecalciferol (VITAMIN D) 1000 units tablet Take 1,000 Units by mouth 2 (two) times daily.   . cilostazol (PLETAL) 50 MG tablet Take 50 mg by mouth 2 (two) times daily.    Marland Kitchen donepezil (ARICEPT) 10 MG tablet Take 10 mg by mouth every evening.   . escitalopram (LEXAPRO) 5 MG tablet Take one tablets by mouth at bedtime for mood  . furosemide (LASIX) 20 MG tablet Take 60 mg by mouth daily. Hold for systolic below BP below 732  . gabapentin (NEURONTIN) 300 MG capsule Take 300 mg by mouth 2 (two) times daily.    Marland Kitchen levothyroxine (SYNTHROID, LEVOTHROID) 50 MCG tablet Take 50 mcg by mouth daily.    Marland Kitchen lisinopril (PRINIVIL,ZESTRIL) 2.5 MG tablet Take 2.5 mg by mouth daily.  . memantine (NAMENDA) 10 MG tablet Take one tablet by mouth twice daily  . metFORMIN (GLUCOPHAGE) 500 MG tablet Take 500 mg by mouth 2 (two) times daily with a meal.    . omeprazole (PRILOSEC) 20 MG capsule Take 20 mg by mouth at bedtime.    . pravastatin (PRAVACHOL) 80 MG tablet Take 80 mg by mouth at bedtime.     No facility-administered encounter medications on file as of 03/26/2017.      Review of Systems   This is somewhat limited since patient does have some dementia but she is not complaining of any discomfort or shortness of breath.  Skin is not complaining of itching or rashes.  Head ears eyes nose mouth and throat appears to have some  nasal congestion and drainage but is not complaining of any shortness of breath or sore throat.  Does not complain of visual changes.  Respiratory is not complaining of shortness of breath she does have a somewhat dry cough-.  Cardiac is not complaining of any chest pain continues to have significant lower extremity edema although this appears to have stabilized.  GI is not complaining of abdominal discomfort nausea vomiting diarrhea constipation continues to have a good appetite and snacks often.  GU does not complain of dysuria.  Musculoskeletal is not complaining of joint pain currently continues to ambulate in a wheelchair.  Neurologic does not complain of dizziness headache or syncope.  Psych does have a history of mild to moderate dementia but does well with supportive care at  one point was concern for depression but this appears to have been transitory she is on low-dose Lexapro.      Immunization History  Administered Date(s) Administered  . Influenza Whole 10/11/2006  . Influenza-Unspecified 03/19/2009, 12/30/2016  . Pneumococcal Conjugate-13 12/30/2016  . Td 01/14/2003  . Tdap 12/15/2016   Pertinent  Health Maintenance Due  Topic Date Due  . HEMOGLOBIN A1C  08/07/2017  . URINE MICROALBUMIN  10/21/2017  . FOOT EXAM  12/21/2017  . PNA vac Low Risk Adult (2 of 2 - PPSV23) 12/30/2017  . OPHTHALMOLOGY EXAM  01/17/2018  . INFLUENZA VACCINE  Completed  . DEXA SCAN  Completed   Fall Risk  12/15/2016  Falls in the past year? No   Functional Status Survey:    Vitals:   03/26/17 1138  BP: 135/80  Pulse: 76  Resp: 18  Temp: 98.4 F (36.9 C)  TempSrc: Oral  Weight is 246 pounds this has been stable the past month initially had about 15 pound weight gain since the summer  Physical Exam   In general this is a pleasant elderly female in no distress sitting comfortably in her wheelchair.  Her skin is warm and dry.  Eyes sclera and conjunctive are clear visual  acuity appears grossly intact.  Nose appears to have some possibly some clear drainage.  Oropharynx is clear mucous membranes moist.  Chest is largely clear to auscultation some bronchial sounds at the bases no labored breathing.  Heart is regular rate and rhythm without murmur gallop or rub she has moderate lower extremity edema venous stasis changes appears to be relatively at her baseline recently.  Abdomen is obese soft nontender with positive bowel sounds.  Musculoskeletal moves all extremities x4 at baseline some lower extremity weakness with the edema -- ambulates in a wheelchair largely  Neurologic is grossly intact her speech is clear no lateralizing findings.  Psych she is oriented to self can carry on a fairly straightforward conversation continues to be quite verbose at times- appears to be in good spirits.      Labs reviewed: Recent Labs    02/07/17 0330 02/22/17 0708 03/01/17 0706  NA 142 140 143  K 4.4 4.4 4.3  CL 103 99* 102  CO2 33* 34* 33*  GLUCOSE 141* 149* 134*  BUN 10 13 17   CREATININE 0.98 1.12* 1.09*  CALCIUM 8.5* 9.4 8.7*   Recent Labs    04/27/16 0730 08/31/16 1300 02/22/17 0708  AST 15 16 21   ALT 9* 10* 12*  ALKPHOS 56 64 72  BILITOT 0.4 0.3 0.7  PROT 6.4* 6.7 7.7  ALBUMIN 3.1* 3.3* 3.9   Recent Labs    09/02/16 0700 09/27/16 0500  02/07/17 0330 02/22/17 0708 03/01/17 0706  WBC 5.1 5.0   < > 4.6 6.7 5.8  NEUTROABS 2.9 2.7  --   --  4.5  --   HGB 10.7* 11.7*   < > 11.5* 11.4* 11.2*  HCT 37.1 39.8   < > 40.9 41.7 41.4  MCV 82.8 82.4   < > 82.8 83.2 83.5  PLT 212 259   < > 193 229 241   < > = values in this interval not displayed.   Lab Results  Component Value Date   TSH 1.667 02/07/2017   Lab Results  Component Value Date   HGBA1C 7.5 (H) 02/07/2017   Lab Results  Component Value Date   CHOL 150 11/26/2016   HDL 45 11/26/2016   LDLCALC 85 11/26/2016  TRIG 99 11/26/2016   CHOLHDL 3.3 11/26/2016    Significant  Diagnostic Results in last 30 days:  No results found.  Assessment/Plan  #1-history of cough she does have a history of respiratory infections as noted above will order a 2 view chest x-ray also will order duo nebs every 6 hours as needed for 5 days.  Will start Flonase 1 spray each nostril twice daily for 5 days secondary to concerns of some possible allergic rhinitis nasal discharge contribution.  Also will start Mucinex 600 mg twice daily for 5 days  And monitor vital signs and pulse ox every shift for 72 hours Also will update a CBC with differential tomorrow keep an eye on her white count  #2 weight gain-this appears to have moderated the past month- Lasix has been increased she appears to be tolerating this well will update her renal function to ensure stability-BNP again was mildly elevated at over 400 on lab done in November-.  She is also been encouraged to not snack as much although again this is somewhat challenging.  3.-History of diabetes type 2 this appears fairly well controlled Glucophage again her snacking sometimes presents a challenge sugars have largely been in the lower mid 100s with hemoglobin A1c of 7.5 on lab done in November.  4.-History of hypertension this appears stable as noted above on lisinopril is also on Lasix.  5.  Hypothyroidism again this appears stable as well TSH has been within normal limits most recently done back in [place] continues on Synthroid.  6.-History of hyperlipidemia this is stable as well with an LDL of 85 on lab done in September 2018.  7.  History of peripheral vascular disease  appears to be doing well on the aspirinand Pletal does not really appear to be having pain at this time-at times she will complain when her legs are touched but I suspect there is more of a psychological component to this   #8- history UTIs she has completed treatment appears at her baseline--earlier this month she did grow out  2 organism E. coli and  Enterobacter.  And was treated with Cefdinir  #9 history of vitamin D deficiency she is on supplementation vitamin D level was 35 on lab done in September will monitor periodically.  [id number] note greater than 35 minutes spent assessing patient-reviewing her chart-reviewing her labs- discussing her status with nursing staff- and coordinating and formulating a plan of care for numerous diagnoses-of note greater than 50% of time spent coordinating plan of care

## 2017-03-26 NOTE — Progress Notes (Deleted)
Location:   [facility] Room Number: 149/W Place of Service:  SNF [phone]) Provider:  Tera Helper, MD  Patient Care Team: Fayrene Helper, MD as PCP - General  Extended Emergency Contact Information Primary Emergency Contact: Valarie Merino Address: [place], [place] of [place] Phone: [phone] Work Phone: [phone] Relation: Daughter  Code Status:  Full Code Goals of care: Advanced Directive information Advanced Directives 03/26/2017  Does Patient Have a Medical Advance Directive? Yes  Type of Advance Directive (No Data)  Does patient want to make changes to medical advance directive? No - Patient declined  Copy of [place] in Chart? -     Chief Complaint  Patient presents with  . Acute Visit    Patients c/o Cough with Mucus     HPI:  Pt is a 83 y.o. female seen today for an acute visit for    Past Medical History:  Diagnosis Date  . Acute cystitis   . Dementia   . Dermatomycosis, unspecified   . DJD (degenerative joint disease)    Spine  . DM (diabetes mellitus) ([place])   . GERD (gastroesophageal reflux disease)   . History of pulmonary embolism   . History of supraventricular tachycardia   . Hyperlipidemia   . Hypertension   . Hypothyroidism   . Long term (current) use of anticoagulants    coumadin  . Obesity   . Other malaise and fatigue   . PVD (peripheral vascular disease) ([place])    Past Surgical History:  Procedure Laterality Date  . ATRIAL ABLATION SURGERY  01/2003   Catheter, to tachic palpitations  . MASTECTOMY  2003   Left  . THYROIDECTOMY, PARTIAL  1962    Allergies  Allergen Reactions  . Contrast Media [Iodinated Diagnostic Agents] Other (See Comments)    Unknown-listed on MAR-patient is not aware of these allergies  . Latex Other (See Comments)    Unknown-listed on MAR-patient is not aware of these allergies     Outpatient Encounter Medications as of 03/26/2017  Medication Sig  . acetaminophen (TYLENOL) 325 MG tablet Take 650 mg by mouth 2 (two) times daily.  Marland Kitchen aspirin 81 MG tablet Take 81 mg by mouth daily.   Roseanne Kaufman [place]-Castor Oil (VENELEX) OINT Apply to sacrum and bilateral buttocks each shift and prn for prevention  . calcium-vitamin D (OSCAL WITH D) 500-200 MG-UNIT per tablet Take 1 tablet by mouth 2 (two) times daily.   . cholecalciferol (VITAMIN D) 1000 units tablet Take 1,000 Units by mouth 2 (two) times daily.   . cilostazol (PLETAL) 50 MG tablet Take 50 mg by mouth 2 (two) times daily.    Marland Kitchen donepezil (ARICEPT) 10 MG tablet Take 10 mg by mouth every evening.   . escitalopram (LEXAPRO) 5 MG tablet Take one tablets by mouth at bedtime for mood  . furosemide (LASIX) 20 MG tablet Take 60 mg by mouth daily. Hold for systolic below BP below 287  . gabapentin (NEURONTIN) 300 MG capsule Take 300 mg by mouth 2 (two) times daily.    Marland Kitchen levothyroxine (SYNTHROID, LEVOTHROID) 50 MCG tablet Take 50 mcg by mouth daily.    Marland Kitchen lisinopril (PRINIVIL,ZESTRIL) 2.5 MG tablet Take 2.5 mg by mouth daily.  . memantine (NAMENDA) 10 MG tablet Take one tablet by mouth twice daily  . metFORMIN (GLUCOPHAGE) 500 MG tablet Take 500 mg by mouth  2 (two) times daily with a meal.    . omeprazole (PRILOSEC) 20 MG capsule Take 20 mg by mouth at bedtime.    . pravastatin (PRAVACHOL) 80 MG tablet Take 80 mg by mouth at bedtime.     No facility-administered encounter medications on file as of 03/26/2017.     Review of Systems  Immunization History  Administered Date(s) Administered  . Influenza Whole 10/11/2006  . Influenza-Unspecified 03/19/2009, 12/30/2016  . Pneumococcal Conjugate-13 12/30/2016  . Td 01/14/2003  . Tdap 12/15/2016   Pertinent  Health Maintenance Due  Topic Date Due  . HEMOGLOBIN A1C  08/07/2017  . URINE MICROALBUMIN  10/21/2017  . FOOT EXAM  12/21/2017  . PNA vac Low Risk Adult (2 of 2 -  PPSV23) 12/30/2017  . OPHTHALMOLOGY EXAM  01/17/2018  . INFLUENZA VACCINE  Completed  . DEXA SCAN  Completed   Fall Risk  12/15/2016  Falls in the past year? No   Functional Status Survey:    There were no vitals filed for this visit. There is no height or weight on file to calculate BMI. Physical Exam  Labs reviewed: Recent Labs    02/07/17 0330 02/22/17 0708 03/01/17 0706  NA 142 140 143  K 4.4 4.4 4.3  CL 103 99* 102  CO2 33* 34* 33*  GLUCOSE 141* 149* 134*  BUN 10 13 17   CREATININE 0.98 1.12* 1.09*  CALCIUM 8.5* 9.4 8.7*   Recent Labs    04/27/16 0730 08/31/16 1300 02/22/17 0708  AST 15 16 21   ALT 9* 10* 12*  ALKPHOS 56 64 72  BILITOT 0.4 0.3 0.7  PROT 6.4* 6.7 7.7  ALBUMIN 3.1* 3.3* 3.9   Recent Labs    09/02/16 0700 09/27/16 0500  02/07/17 0330 02/22/17 0708 03/01/17 0706  WBC 5.1 5.0   < > 4.6 6.7 5.8  NEUTROABS 2.9 2.7  --   --  4.5  --   HGB 10.7* 11.7*   < > 11.5* 11.4* 11.2*  HCT 37.1 39.8   < > 40.9 41.7 41.4  MCV 82.8 82.4   < > 82.8 83.2 83.5  PLT 212 259   < > 193 229 241   < > = values in this interval not displayed.   Lab Results  Component Value Date   TSH 1.667 02/07/2017   Lab Results  Component Value Date   HGBA1C 7.5 (H) 02/07/2017   Lab Results  Component Value Date   CHOL 150 11/26/2016   HDL 45 11/26/2016   LDLCALC 85 11/26/2016   TRIG 99 11/26/2016   CHOLHDL 3.3 11/26/2016    Significant Diagnostic Results in last 30 days:  No results found.  Assessment/Plan There are no diagnoses linked to this encounter.      Oralia Manis, [place]

## 2017-03-27 ENCOUNTER — Encounter (HOSPITAL_COMMUNITY)
Admission: RE | Admit: 2017-03-27 | Discharge: 2017-03-27 | Disposition: A | Discharge status: Home / Self Care | Payer: Medicare Other | Source: Skilled Nursing Facility | Attending: *Deleted | Admitting: *Deleted

## 2017-03-27 DIAGNOSIS — I10 Essential (primary) hypertension: Secondary | ICD-10-CM | POA: Diagnosis not present

## 2017-03-27 LAB — BASIC METABOLIC PANEL
ANION GAP: 8 (ref 5–15)
BUN: 19 mg/dL (ref 6–20)
CALCIUM: 8.8 mg/dL — AB (ref 8.9–10.3)
CO2: 34 mmol/L — ABNORMAL HIGH (ref 22–32)
Chloride: 99 mmol/L — ABNORMAL LOW (ref 101–111)
Creatinine, Ser: 1.11 mg/dL — ABNORMAL HIGH (ref 0.44–1.00)
GFR, EST AFRICAN AMERICAN: 48 mL/min — AB (ref >60)
GFR, EST NON AFRICAN AMERICAN: 41 mL/min — AB (ref >60)
Glucose, Bld: 125 mg/dL — ABNORMAL HIGH (ref 65–99)
POTASSIUM: 3.9 mmol/L (ref 3.5–5.1)
SODIUM: 141 mmol/L (ref 135–145)

## 2017-03-27 LAB — CBC WITH DIFFERENTIAL/PLATELET
BASOS ABS: 0 10*3/uL (ref 0.0–0.1)
BASOS PCT: 0 %
EOS PCT: 3 %
Eosinophils Absolute: 0.1 10*3/uL (ref 0.0–0.7)
HCT: 40.9 % (ref 36.0–46.0)
Hemoglobin: 11 g/dL — ABNORMAL LOW (ref 12.0–15.0)
LYMPHS PCT: 25 %
Lymphs Abs: 1.2 10*3/uL (ref 0.7–4.0)
MCH: 22 pg — ABNORMAL LOW (ref 26.0–34.0)
MCHC: 26.9 g/dL — ABNORMAL LOW (ref 30.0–36.0)
MCV: 82 fL (ref 78.0–100.0)
MONO ABS: 0.6 10*3/uL (ref 0.1–1.0)
Monocytes Relative: 12 %
NEUTROS ABS: 2.7 10*3/uL (ref 1.7–7.7)
Neutrophils Relative %: 60 %
PLATELETS: 247 10*3/uL (ref 150–400)
RBC: 4.99 MIL/uL (ref 3.87–5.11)
RDW: 17.3 % — AB (ref 11.5–15.5)
WBC: 4.6 10*3/uL (ref 4.0–10.5)

## 2017-04-03 ENCOUNTER — Encounter: Payer: Self-pay | Admitting: Internal Medicine

## 2017-04-03 NOTE — Progress Notes (Signed)
Location:   [facility] Room Number: 149/W Place of Service:  SNF [phone]) Provider:  Tera Helper, MD  Patient Care Team: Fayrene Helper, MD as PCP - General  Extended Emergency Contact Information Primary Emergency Contact: Valarie Merino Address: [place], [place] of [place] Phone: [phone] Work Phone: [phone] Relation: Daughter  Code Status: Full Code  Goals of care: Advanced Directive information Advanced Directives 04/03/2017  Does Patient Have a Medical Advance Directive? Yes  Type of Advance Directive (No Data)  Does patient want to make changes to medical advance directive? No - Patient declined  Copy of [place] in Chart? -     Chief Complaint  Patient presents with  . Medical Management of Chronic Issues    Routine Visit    HPI:  Pt is a 84 y.o. female seen today for medical management of chronic diseases.     Past Medical History:  Diagnosis Date  . Acute cystitis   . Dementia   . Dermatomycosis, unspecified   . DJD (degenerative joint disease)    Spine  . DM (diabetes mellitus) ([place])   . GERD (gastroesophageal reflux disease)   . History of pulmonary embolism   . History of supraventricular tachycardia   . Hyperlipidemia   . Hypertension   . Hypothyroidism   . Long term (current) use of anticoagulants    coumadin  . Obesity   . Other malaise and fatigue   . PVD (peripheral vascular disease) ([place])    Past Surgical History:  Procedure Laterality Date  . ATRIAL ABLATION SURGERY  01/2003   Catheter, to tachic palpitations  . MASTECTOMY  2003   Left  . THYROIDECTOMY, PARTIAL  1962    Allergies  Allergen Reactions  . Contrast Media [Iodinated Diagnostic Agents] Other (See Comments)    Unknown-listed on MAR-patient is not aware of these allergies  . Latex Other (See Comments)    Unknown-listed on MAR-patient is not aware  of these allergies    Outpatient Encounter Medications as of 04/03/2017  Medication Sig  . acetaminophen (TYLENOL) 325 MG tablet Take 650 mg by mouth 2 (two) times daily.  Marland Kitchen aspirin 81 MG tablet Take 81 mg by mouth daily.   Roseanne Kaufman [place]-Castor Oil (VENELEX) OINT Apply to sacrum and bilateral buttocks each shift and prn for prevention  . calcium-vitamin D (OSCAL WITH D) 500-200 MG-UNIT per tablet Take 1 tablet by mouth 2 (two) times daily.   . cholecalciferol (VITAMIN D) 1000 units tablet Take 1,000 Units by mouth 2 (two) times daily.   . cilostazol (PLETAL) 50 MG tablet Take 50 mg by mouth 2 (two) times daily.    Marland Kitchen donepezil (ARICEPT) 10 MG tablet Take 10 mg by mouth every evening.   . escitalopram (LEXAPRO) 5 MG tablet Take one tablets by mouth at bedtime for mood  . furosemide (LASIX) 20 MG tablet Take 60 mg by mouth daily. Hold for systolic below BP below 570  . gabapentin (NEURONTIN) 300 MG capsule Take 300 mg by mouth 2 (two) times daily.    Marland Kitchen levothyroxine (SYNTHROID, LEVOTHROID) 50 MCG tablet Take 50 mcg by mouth daily.    Marland Kitchen lisinopril (PRINIVIL,ZESTRIL) 2.5 MG tablet Take 2.5 mg by mouth daily.  . memantine (NAMENDA) 10 MG tablet Take one tablet by mouth twice daily  . metFORMIN (GLUCOPHAGE) 500 MG tablet Take 500 mg by  mouth 2 (two) times daily with a meal.    . omeprazole (PRILOSEC) 20 MG capsule Take 20 mg by mouth at bedtime.    . pravastatin (PRAVACHOL) 80 MG tablet Take 80 mg by mouth at bedtime.     No facility-administered encounter medications on file as of 04/03/2017.      Review of Systems  Immunization History  Administered Date(s) Administered  . Influenza Whole 10/11/2006  . Influenza-Unspecified 03/19/2009, 12/30/2016  . Pneumococcal Conjugate-13 12/30/2016  . Td 01/14/2003  . Tdap 12/15/2016   Pertinent  Health Maintenance Due  Topic Date Due  . HEMOGLOBIN A1C  08/07/2017  . URINE MICROALBUMIN  10/21/2017  . FOOT EXAM  12/21/2017  . PNA vac Low Risk Adult  (2 of 2 - PPSV23) 12/30/2017  . OPHTHALMOLOGY EXAM  01/17/2018  . INFLUENZA VACCINE  Completed  . DEXA SCAN  Completed   Fall Risk  12/15/2016  Falls in the past year? No   Functional Status Survey:    Vitals:   04/03/17 1449  BP: 124/78  Pulse: 70  Resp: 18  Temp: (!) 97.2 F (36.2 C)  TempSrc: Oral  SpO2: 95%  Weight: 246 lb 9.6 oz (111.9 kg)  Height: 5\' 9"  (1.753 m)   Body mass index is 36.42 kg/m. Physical Exam  Labs reviewed: Recent Labs    02/22/17 0708 03/01/17 0706 03/27/17 0400  NA 140 143 141  K 4.4 4.3 3.9  CL 99* 102 99*  CO2 34* 33* 34*  GLUCOSE 149* 134* 125*  BUN 13 17 19   CREATININE 1.12* 1.09* 1.11*  CALCIUM 9.4 8.7* 8.8*   Recent Labs    04/27/16 0730 08/31/16 1300 02/22/17 0708  AST 15 16 21   ALT 9* 10* 12*  ALKPHOS 56 64 72  BILITOT 0.4 0.3 0.7  PROT 6.4* 6.7 7.7  ALBUMIN 3.1* 3.3* 3.9   Recent Labs    09/27/16 0500  02/22/17 0708 03/01/17 0706 03/27/17 0400  WBC 5.0   < > 6.7 5.8 4.6  NEUTROABS 2.7  --  4.5  --  2.7  HGB 11.7*   < > 11.4* 11.2* 11.0*  HCT 39.8   < > 41.7 41.4 40.9  MCV 82.4   < > 83.2 83.5 82.0  PLT 259   < > 229 241 247   < > = values in this interval not displayed.   Lab Results  Component Value Date   TSH 1.667 02/07/2017   Lab Results  Component Value Date   HGBA1C 7.5 (H) 02/07/2017   Lab Results  Component Value Date   CHOL 150 11/26/2016   HDL 45 11/26/2016   LDLCALC 85 11/26/2016   TRIG 99 11/26/2016   CHOLHDL 3.3 11/26/2016    Significant Diagnostic Results in last 30 days:  No results found.  Assessment/Plan There are no diagnoses linked to this encounter.   Family/ staff Communication:   Labs/tests ordered:

## 2017-04-03 NOTE — Progress Notes (Signed)
This encounter was created in error - please disregard.

## 2017-04-17 ENCOUNTER — Encounter: Payer: Self-pay | Admitting: Internal Medicine

## 2017-04-17 ENCOUNTER — Non-Acute Institutional Stay (SKILLED_NURSING_FACILITY): Payer: Medicare Other | Admitting: Internal Medicine

## 2017-04-17 DIAGNOSIS — K219 Gastro-esophageal reflux disease without esophagitis: Secondary | ICD-10-CM | POA: Diagnosis not present

## 2017-04-17 DIAGNOSIS — F0280 Dementia in other diseases classified elsewhere without behavioral disturbance: Secondary | ICD-10-CM

## 2017-04-17 DIAGNOSIS — R600 Localized edema: Secondary | ICD-10-CM

## 2017-04-17 DIAGNOSIS — I10 Essential (primary) hypertension: Secondary | ICD-10-CM | POA: Diagnosis not present

## 2017-04-17 DIAGNOSIS — E1151 Type 2 diabetes mellitus with diabetic peripheral angiopathy without gangrene: Secondary | ICD-10-CM

## 2017-04-17 DIAGNOSIS — G309 Alzheimer's disease, unspecified: Secondary | ICD-10-CM

## 2017-04-17 DIAGNOSIS — E782 Mixed hyperlipidemia: Secondary | ICD-10-CM

## 2017-04-17 DIAGNOSIS — I739 Peripheral vascular disease, unspecified: Secondary | ICD-10-CM | POA: Diagnosis not present

## 2017-04-17 DIAGNOSIS — E038 Other specified hypothyroidism: Secondary | ICD-10-CM | POA: Diagnosis not present

## 2017-04-17 NOTE — Progress Notes (Signed)
Location:   [facility] Room Number: 149/W Place of Service:  SNF [phone]) Provider:  Thurmond Butts, MD  Patient Care Team: Fayrene Helper, MD as PCP - General  Extended Emergency Contact Information Primary Emergency Contact: Valarie Merino Address: [address], [place] Phone: [phone] Work Phone: [phone] Relation: Daughter  Code Status:  Full Code Goals of care: Advanced Directive information Advanced Directives 04/17/2017  Does Patient Have a Medical Advance Directive? Yes  Type of Advance Directive (No Data)  Does patient want to make changes to medical advance directive? No - Patient declined  Copy of [place] in Chart? -     Chief Complaint  Patient presents with  . Medical Management of Chronic Issues    Routine Visit    HPI:  Pt is a 84 y.o. female seen today for medical management of chronic diseases.   Patient has H/O Dementia, Diabetes Type 2, Hypothyroidism, Peripheral vascular disease and Chronic Venous stasis, Hyperlipidemia.  Patient is doing well in facility.  Her main issue is weight gain with worsening edema .  she has gained almost 20 pounds the past few months.   She also continues to have edema in her legs.  I have increased her Lasix to 60 mg .  No change in her weight or edema. She denies any cough chest pain or shortness of breath.  But Ms. Seide always denies everything.  She also does not like to go out for any testing so cannot do  2D echo on her. She did have Dopplers of her legs and a few months ago and they were negative for any DVT.   Past Medical History:  Diagnosis Date  . Acute cystitis   . Dementia   . Dermatomycosis, unspecified   . DJD (degenerative joint disease)    Spine  . DM (diabetes mellitus) ([place])   . GERD (gastroesophageal reflux disease)   . History of pulmonary embolism   . History of  supraventricular tachycardia   . Hyperlipidemia   . Hypertension   . Hypothyroidism   . Long term (current) use of anticoagulants    coumadin  . Obesity   . Other malaise and fatigue   . PVD (peripheral vascular disease) ([place])    Past Surgical History:  Procedure Laterality Date  . ATRIAL ABLATION SURGERY  01/2003   Catheter, to tachic palpitations  . MASTECTOMY  2003   Left  . THYROIDECTOMY, PARTIAL  1962    Allergies  Allergen Reactions  . Contrast Media [Iodinated Diagnostic Agents] Other (See Comments)    Unknown-listed on MAR-patient is not aware of these allergies  . Latex Other (See Comments)    Unknown-listed on MAR-patient is not aware of these allergies    Outpatient Encounter Medications as of 04/17/2017  Medication Sig  . acetaminophen (TYLENOL) 325 MG tablet Take 650 mg by mouth 2 (two) times daily.  Marland Kitchen aspirin 81 MG tablet Take 81 mg by mouth daily.   Roseanne Kaufman [place]-Castor Oil (VENELEX) OINT Apply to sacrum and bilateral buttocks each shift and prn for prevention  . calcium-vitamin D (OSCAL WITH D) 500-200 MG-UNIT per tablet Take 1 tablet by mouth 2 (two) times daily.   . cholecalciferol (VITAMIN D) 1000 units tablet Take 1,000 Units by mouth 2 (two) times daily.   . cilostazol (PLETAL) 50 MG tablet Take 50 mg by  mouth 2 (two) times daily.    Marland Kitchen donepezil (ARICEPT) 10 MG tablet Take 10 mg by mouth every evening.   . escitalopram (LEXAPRO) 5 MG tablet Take one tablets by mouth at bedtime for mood  . furosemide (LASIX) 20 MG tablet Take 60 mg by mouth daily. Hold for systolic below BP below 102  . gabapentin (NEURONTIN) 300 MG capsule Take 300 mg by mouth 2 (two) times daily.    Marland Kitchen levothyroxine (SYNTHROID, LEVOTHROID) 50 MCG tablet Take 50 mcg by mouth daily.    Marland Kitchen lisinopril (PRINIVIL,ZESTRIL) 2.5 MG tablet Take 2.5 mg by mouth daily.  . memantine (NAMENDA) 10 MG tablet Take one tablet by mouth twice daily  . metFORMIN (GLUCOPHAGE) 500 MG tablet Take 500 mg by mouth  2 (two) times daily with a meal.    . omeprazole (PRILOSEC) 20 MG capsule Take 20 mg by mouth at bedtime.    . pravastatin (PRAVACHOL) 80 MG tablet Take 80 mg by mouth at bedtime.     No facility-administered encounter medications on file as of 04/17/2017.      Review of Systems  Unable to perform ROS: Dementia    Immunization History  Administered Date(s) Administered  . Influenza Whole 10/11/2006  . Influenza-Unspecified 03/19/2009, 12/30/2016  . Pneumococcal Conjugate-13 12/30/2016  . Td 01/14/2003  . Tdap 12/15/2016   Pertinent  Health Maintenance Due  Topic Date Due  . HEMOGLOBIN A1C  08/07/2017  . URINE MICROALBUMIN  10/21/2017  . FOOT EXAM  12/21/2017  . PNA vac Low Risk Adult (2 of 2 - PPSV23) 12/30/2017  . OPHTHALMOLOGY EXAM  01/17/2018  . INFLUENZA VACCINE  Completed  . DEXA SCAN  Completed   Fall Risk  12/15/2016  Falls in the past year? No   Functional Status Survey:    Vitals:   04/17/17 1213  BP: 126/74  Pulse: 79  Resp: 18  Temp: 98.7 F (37.1 C)  TempSrc: Oral  SpO2: 95%  Weight: 249 lb 12.8 oz (113.3 kg)  Height: 5\' 9"  (1.753 m)   Body mass index is 36.89 kg/m. Physical Exam  Constitutional: She appears well-developed and well-nourished.  HENT:  Head: Normocephalic.  Mouth/Throat: Oropharynx is clear and moist.  Eyes: Pupils are equal, round, and reactive to light.  Neck: Neck supple.  Cardiovascular: Normal rate and normal heart sounds.  Pulmonary/Chest: Effort normal and breath sounds normal. No respiratory distress. She has no wheezes. She has no rales.  Abdominal: Soft. Bowel sounds are normal. She exhibits no distension. There is no tenderness. There is no rebound.  Musculoskeletal:  Moderate amount of edema bilateral  Neurological: She is alert.  Skin: Skin is warm and dry.  Psychiatric: She has a normal mood and affect. Her behavior is normal.    Labs reviewed: Recent Labs    02/22/17 0708 03/01/17 0706 03/27/17 0400  NA  140 143 141  K 4.4 4.3 3.9  CL 99* 102 99*  CO2 34* 33* 34*  GLUCOSE 149* 134* 125*  BUN 13 17 19   CREATININE 1.12* 1.09* 1.11*  CALCIUM 9.4 8.7* 8.8*   Recent Labs    04/27/16 0730 08/31/16 1300 02/22/17 0708  AST 15 16 21   ALT 9* 10* 12*  ALKPHOS 56 64 72  BILITOT 0.4 0.3 0.7  PROT 6.4* 6.7 7.7  ALBUMIN 3.1* 3.3* 3.9   Recent Labs    09/27/16 0500  02/22/17 0708 03/01/17 0706 03/27/17 0400  WBC 5.0   < > 6.7 5.8 4.6  NEUTROABS 2.7  --  4.5  --  2.7  HGB 11.7*   < > 11.4* 11.2* 11.0*  HCT 39.8   < > 41.7 41.4 40.9  MCV 82.4   < > 83.2 83.5 82.0  PLT 259   < > 229 241 247   < > = values in this interval not displayed.   Lab Results  Component Value Date   TSH 1.667 02/07/2017   Lab Results  Component Value Date   HGBA1C 7.5 (H) 02/07/2017   Lab Results  Component Value Date   CHOL 150 11/26/2016   HDL 45 11/26/2016   LDLCALC 85 11/26/2016   TRIG 99 11/26/2016   CHOLHDL 3.3 11/26/2016    Significant Diagnostic Results in last 30 days:  No results found.  Assessment/Plan  Lower extremity edema Cannot do echo on Ms. Torry as she does not like to go to facility for testing Will start her on metolazone twice a week Decrease the dose of Lasix to 40 mg Repeat BMP in 2 weeks Weights 3 times a week  Diabetes mellitus with nephropathy and neuropathy  Her blood sugars run less than 150 A1C  was 7.5 in 011/18 Continue on same dose of Metformin Also on Neurontin  Essential hypertension Patient stable on lisinopril and Lasix.   Peripheral vascular disease Continue on aspirin and Pletal Hypothyroidism TSH normal in 1118  Alzheimer's dementia Stable on Aricept and Namenda   Hyperlipidemia Continue pravastatin.  LDL 85 in 09/18 GERD Change Prilosec to Zantac  DEXA scan was Tscore -.1.Marland Kitchen12 in 08/18 Family/ staff Communication:   Labs/tests ordered:    Total time spent in this patient care encounter was 25_ minutes; greater than 50% of the  visit spent counseling patient, reviewing records , Labs and coordinating care for problems addressed at this encounter.

## 2017-04-21 ENCOUNTER — Non-Acute Institutional Stay (SKILLED_NURSING_FACILITY): Payer: Medicare Other | Admitting: Internal Medicine

## 2017-04-21 ENCOUNTER — Encounter: Payer: Self-pay | Admitting: Internal Medicine

## 2017-04-21 DIAGNOSIS — R600 Localized edema: Secondary | ICD-10-CM | POA: Diagnosis not present

## 2017-04-21 DIAGNOSIS — I739 Peripheral vascular disease, unspecified: Secondary | ICD-10-CM

## 2017-04-21 DIAGNOSIS — I10 Essential (primary) hypertension: Secondary | ICD-10-CM | POA: Diagnosis not present

## 2017-04-21 DIAGNOSIS — E1151 Type 2 diabetes mellitus with diabetic peripheral angiopathy without gangrene: Secondary | ICD-10-CM | POA: Diagnosis not present

## 2017-04-21 NOTE — Progress Notes (Signed)
Location:   [facility] Room Number: 149/W Place of Service:  SNF [phone]) Provider:  Thurmond Butts, MD  Patient Care Team: Fayrene Helper, MD as PCP - General  Extended Emergency Contact Information Primary Emergency Contact: Valarie Merino Address: [place] [address], [place] of [place] Phone: [phone] Work Phone: [phone] Relation: Daughter  Code Status:  Full Code Goals of care: Advanced Directive information Advanced Directives 04/21/2017  Does Patient Have a Medical Advance Directive? Yes  Type of Advance Directive (No Data)  Does patient want to make changes to medical advance directive? No - Patient declined  Copy of [place] in Chart? -     Chief Complaint  Patient presents with  . Acute Visit    Patients c/o Worsening LE swelling with some redness.   HPI:  Pt is a 84 y.o. female seen today for an acute visit for redness in Her Right LE with Worsening Swelling Patient has H/O Dementia, Diabetes Type 2, Hypothyroidism, Peripheral vascular disease and Chronic Venous stasis, Hyperlipidemia. Patient few days ago  for routine and noticed that she has gained about 20 pounds.  She also continues to have edema in her legs so had started her on metolazone. Today patient nurse noticed some leaking and redness in her right lower extremity.  With some worsening edema.  There was some concern that patient is developing cellulitis in that leg. Patient denies any cough chest pain or shortness of breath but she always denies everything.  She also does not go out for any kind of testing but her Doppler in the past has been negative.  She denied any pain in her leg     Past Medical History:  Diagnosis Date  . Acute cystitis   . Dementia   . Dermatomycosis, unspecified   . DJD (degenerative joint disease)    Spine  . DM (diabetes mellitus) ([place])   . GERD  (gastroesophageal reflux disease)   . History of pulmonary embolism   . History of supraventricular tachycardia   . Hyperlipidemia   . Hypertension   . Hypothyroidism   . Long term (current) use of anticoagulants    coumadin  . Obesity   . Other malaise and fatigue   . PVD (peripheral vascular disease) ([place])    Past Surgical History:  Procedure Laterality Date  . ATRIAL ABLATION SURGERY  01/2003   Catheter, to tachic palpitations  . MASTECTOMY  2003   Left  . THYROIDECTOMY, PARTIAL  1962    Allergies  Allergen Reactions  . Contrast Media [Iodinated Diagnostic Agents] Other (See Comments)    Unknown-listed on MAR-patient is not aware of these allergies  . Latex Other (See Comments)    Unknown-listed on MAR-patient is not aware of these allergies    Outpatient Encounter Medications as of 04/21/2017  Medication Sig  . acetaminophen (TYLENOL) 325 MG tablet Take 650 mg by mouth 2 (two) times daily.  Marland Kitchen aspirin 81 MG tablet Take 81 mg by mouth daily.   Roseanne Kaufman [place]-Castor Oil (VENELEX) OINT Apply to sacrum and bilateral buttocks each shift and prn for prevention  . calcium-vitamin D (OSCAL WITH D) 500-200 MG-UNIT per tablet Take 1 tablet by mouth 2 (two) times daily.   . cholecalciferol (VITAMIN D) 1000 units tablet Take 1,000 Units by mouth 2 (two) times daily.   . cilostazol (PLETAL) 50 MG tablet Take 50  mg by mouth 2 (two) times daily.    Marland Kitchen donepezil (ARICEPT) 10 MG tablet Take 10 mg by mouth every evening.   . escitalopram (LEXAPRO) 5 MG tablet Take one tablets by mouth at bedtime for mood  . furosemide (LASIX) 20 MG tablet Take 40 mg by mouth daily. Hold for systolic below BP below 235  . gabapentin (NEURONTIN) 300 MG capsule Take 300 mg by mouth 2 (two) times daily.    Marland Kitchen levothyroxine (SYNTHROID, LEVOTHROID) 50 MCG tablet Take 50 mcg by mouth daily.    Marland Kitchen lisinopril (PRINIVIL,ZESTRIL) 2.5 MG tablet Take 2.5 mg by mouth daily.  . memantine (NAMENDA) 10 MG tablet Take one  tablet by mouth twice daily  . metFORMIN (GLUCOPHAGE) 500 MG tablet Take 500 mg by mouth 2 (two) times daily with a meal.    . metolazone (ZAROXOLYN) 2.5 MG tablet Take 2.5 mg by mouth daily. Give on Mon., Thu  . pravastatin (PRAVACHOL) 80 MG tablet Take 80 mg by mouth at bedtime.    . ranitidine (ZANTAC) 150 MG capsule Take 150 mg by mouth daily.  . [DISCONTINUED] omeprazole (PRILOSEC) 20 MG capsule Take 20 mg by mouth at bedtime.     No facility-administered encounter medications on file as of 04/21/2017.      Review of Systems  Unable to perform ROS: Dementia    Immunization History  Administered Date(s) Administered  . Influenza Whole 10/11/2006  . Influenza-Unspecified 03/19/2009, 12/30/2016  . Pneumococcal Conjugate-13 12/30/2016  . Td 01/14/2003  . Tdap 12/15/2016   Pertinent  Health Maintenance Due  Topic Date Due  . HEMOGLOBIN A1C  08/07/2017  . URINE MICROALBUMIN  10/21/2017  . FOOT EXAM  12/21/2017  . PNA vac Low Risk Adult (2 of 2 - PPSV23) 12/30/2017  . OPHTHALMOLOGY EXAM  01/17/2018  . INFLUENZA VACCINE  Completed  . DEXA SCAN  Completed   Fall Risk  12/15/2016  Falls in the past year? No   Functional Status Survey:    Vitals:   04/21/17 1051  BP: 139/77  Pulse: 86  Resp: 20  Temp: 98 F (36.7 C)  TempSrc: Oral  SpO2: 94%   There is no height or weight on file to calculate BMI. Physical Exam  Constitutional: She appears well-developed and well-nourished.  HENT:  Head: Normocephalic.  Mouth/Throat: Oropharynx is clear and moist.  Eyes: Pupils are equal, round, and reactive to light.  Neck: Neck supple.  Cardiovascular: Normal rate and normal heart sounds.  Pulmonary/Chest: Effort normal.  Few rales Bilateral  Abdominal: Soft. Bowel sounds are normal. She exhibits no distension. There is no tenderness. There is no rebound.  Musculoskeletal:  Significant Edema Bilateral. With Redness in and around Right LE with leakage from some small blisters    Neurological: She is alert.  Skin: Skin is warm.  Psychiatric: She has a normal mood and affect. Her behavior is normal.    Labs reviewed: Recent Labs    02/22/17 0708 03/01/17 0706 03/27/17 0400  NA 140 143 141  K 4.4 4.3 3.9  CL 99* 102 99*  CO2 34* 33* 34*  GLUCOSE 149* 134* 125*  BUN 13 17 19   CREATININE 1.12* 1.09* 1.11*  CALCIUM 9.4 8.7* 8.8*   Recent Labs    04/27/16 0730 08/31/16 1300 02/22/17 0708  AST 15 16 21   ALT 9* 10* 12*  ALKPHOS 56 64 72  BILITOT 0.4 0.3 0.7  PROT 6.4* 6.7 7.7  ALBUMIN 3.1* 3.3* 3.9   Recent Labs  09/27/16 0500  02/22/17 0708 03/01/17 0706 03/27/17 0400  WBC 5.0   < > 6.7 5.8 4.6  NEUTROABS 2.7  --  4.5  --  2.7  HGB 11.7*   < > 11.4* 11.2* 11.0*  HCT 39.8   < > 41.7 41.4 40.9  MCV 82.4   < > 83.2 83.5 82.0  PLT 259   < > 229 241 247   < > = values in this interval not displayed.   Lab Results  Component Value Date   TSH 1.667 02/07/2017   Lab Results  Component Value Date   HGBA1C 7.5 (H) 02/07/2017   Lab Results  Component Value Date   CHOL 150 11/26/2016   HDL 45 11/26/2016   LDLCALC 85 11/26/2016   TRIG 99 11/26/2016   CHOLHDL 3.3 11/26/2016    Significant Diagnostic Results in last 30 days:  No results found.  Assessment/Plan Worsening lower extremity edema with some redness and leaking Discussed with the wound care nurse At this time I want to avoid starting her on any p.o. antibiotics.  Will try some topical Bactroban and dressing to that leg Will increase her metolazone to 2.5 daily for the next couple of days and see if that helps her edema We will continue Lasix 40 mg We will repeat BMP in few days Continue to do weights 3 times a week At this time we are not going to wrap the leg.   Patient also does not wear her TED hose Diabetes mellitus with nephropathy and neuropathy  Her blood sugars run less than 150 A1C  was 7.5 in 011/18 Continue on same dose of Metformin Also on  Neurontin  Essential hypertension Patient stable on lisinopril and Lasix.   Peripheral vascular disease Continue on aspirin and Pletal Hypothyroidism TSH normal in 11/18  Alzheimer's dementia Stable on Aricept and Namenda   Hyperlipidemia Continue pravastatin.  LDL 85 in 09/18 GERD Change Prilosec to Zantac  DEXA scan was Tscore -.1.Marland Kitchen12 in 08/18   Family/ staff Communication:   Labs/tests ordered:

## 2017-04-26 ENCOUNTER — Non-Acute Institutional Stay (SKILLED_NURSING_FACILITY): Payer: Medicare Other | Admitting: Internal Medicine

## 2017-04-26 ENCOUNTER — Encounter (HOSPITAL_COMMUNITY)
Admission: RE | Admit: 2017-04-26 | Discharge: 2017-04-26 | Disposition: A | Discharge status: Home / Self Care | Payer: Medicare Other | Source: Skilled Nursing Facility | Attending: Internal Medicine | Admitting: Internal Medicine

## 2017-04-26 ENCOUNTER — Encounter: Payer: Self-pay | Admitting: Internal Medicine

## 2017-04-26 DIAGNOSIS — R600 Localized edema: Secondary | ICD-10-CM | POA: Diagnosis not present

## 2017-04-26 DIAGNOSIS — G4700 Insomnia, unspecified: Secondary | ICD-10-CM | POA: Diagnosis present

## 2017-04-26 DIAGNOSIS — L539 Erythematous condition, unspecified: Secondary | ICD-10-CM | POA: Diagnosis not present

## 2017-04-26 DIAGNOSIS — I10 Essential (primary) hypertension: Secondary | ICD-10-CM | POA: Diagnosis present

## 2017-04-26 DIAGNOSIS — F329 Major depressive disorder, single episode, unspecified: Secondary | ICD-10-CM | POA: Insufficient documentation

## 2017-04-26 DIAGNOSIS — N289 Disorder of kidney and ureter, unspecified: Secondary | ICD-10-CM

## 2017-04-26 DIAGNOSIS — G629 Polyneuropathy, unspecified: Secondary | ICD-10-CM | POA: Insufficient documentation

## 2017-04-26 DIAGNOSIS — I739 Peripheral vascular disease, unspecified: Secondary | ICD-10-CM | POA: Diagnosis not present

## 2017-04-26 DIAGNOSIS — K219 Gastro-esophageal reflux disease without esophagitis: Secondary | ICD-10-CM | POA: Diagnosis not present

## 2017-04-26 LAB — BASIC METABOLIC PANEL
Anion gap: 11 (ref 5–15)
BUN: 21 mg/dL — AB (ref 6–20)
CO2: 34 mmol/L — ABNORMAL HIGH (ref 22–32)
CREATININE: 1.25 mg/dL — AB (ref 0.44–1.00)
Calcium: 9.2 mg/dL (ref 8.9–10.3)
Chloride: 93 mmol/L — ABNORMAL LOW (ref 101–111)
GFR calc Af Amer: 42 mL/min — ABNORMAL LOW (ref >60)
GFR calc non Af Amer: 36 mL/min — ABNORMAL LOW (ref >60)
Glucose, Bld: 118 mg/dL — ABNORMAL HIGH (ref 65–99)
Potassium: 4 mmol/L (ref 3.5–5.1)
SODIUM: 138 mmol/L (ref 135–145)

## 2017-04-26 NOTE — Progress Notes (Signed)
Location:   [facility] Room Number: 149/W Place of Service:  SNF [phone]) Provider:  Tera Helper, MD  Patient Care Team: Fayrene Helper, MD as PCP - General  Extended Emergency Contact Information Primary Emergency Contact: Valarie Merino Address: [place], [place] Phone: [phone] Work Phone: [phone] Relation: Daughter  Code Status:  Full Code Goals of care: Advanced Directive information Advanced Directives 04/26/2017  Does Patient Have a Medical Advance Directive? Yes  Type of Advance Directive (No Data)  Does patient want to make changes to medical advance directive? No - Patient declined  Copy of [place] in Chart? -     Chief complaint acute visit follow-up erythema and edema of lower extremities   HPI:  Pt is a 84 y.o. female seen today for an acute visit for follow-up of erythema and edema of her lower extremities with weight gain.  This was thought to be more so of her right lower extremity.  She does have a history of dementia as well as type 2 diabetes hypothyroidism peripheral vascular disease and chronic venous stasis edema as well as hyperlipidemia.  During a routine visit was noted she gained about 20 pounds with a edema in her legs.  She was started on metolazone- subsequently several days ago nurse noted some leaking and redness of her right lower extremity with recent worsening edema there was concern for cellulitis.  Patient is a poor historian but did not complain of any increased pain shortness of breath cough.  Dopplers have been negative in the past for any DVT  No previous visit her situation was discussed with the wound care nurse and there was desire to avoid starting her on p.o. antibiotics if at all possible so topical Bactroban was initiated as well as dressing to the leg metolazone was increased to 2.5 mg daily  for several days.  And Lasix at 40 mg a day was continued.  Appears today her weight is down about 5 pounds and the erythema looks somewhat improved.  She is not complaining of any leg pain she has been afebrile.  In regards to renal function this appears relatively baseline her creatinine has gone up slightly at 1.25 with a BUN of 21 electrolytes are stable.    Past Medical History:  Diagnosis Date  . Acute cystitis   . Dementia   . Dermatomycosis, unspecified   . DJD (degenerative joint disease)    Spine  . DM (diabetes mellitus) ([place])   . GERD (gastroesophageal reflux disease)   . History of pulmonary embolism   . History of supraventricular tachycardia   . Hyperlipidemia   . Hypertension   . Hypothyroidism   . Long term (current) use of anticoagulants    coumadin  . Obesity   . Other malaise and fatigue   . PVD (peripheral vascular disease) ([place])    Past Surgical History:  Procedure Laterality Date  . ATRIAL ABLATION SURGERY  01/2003   Catheter, to tachic palpitations  . MASTECTOMY  2003   Left  . THYROIDECTOMY, PARTIAL  1962    Allergies  Allergen Reactions  . Contrast Media [Iodinated Diagnostic Agents] Other (See Comments)    Unknown-listed on MAR-patient is not aware of these allergies  . Latex Other (See Comments)    Unknown-listed on MAR-patient is not aware of these allergies    Outpatient Encounter Medications  as of 04/26/2017  Medication Sig  . acetaminophen (TYLENOL) 325 MG tablet Take 650 mg by mouth 2 (two) times daily.  Marland Kitchen aspirin 81 MG tablet Take 81 mg by mouth daily.   Roseanne Kaufman [place]-Castor Oil (VENELEX) OINT Apply to sacrum and bilateral buttocks each shift and prn for prevention  . calcium-vitamin D (OSCAL WITH D) 500-200 MG-UNIT per tablet Take 1 tablet by mouth 2 (two) times daily.   . cholecalciferol (VITAMIN D) 1000 units tablet Take 1,000 Units by mouth 2 (two) times daily.   . cilostazol (PLETAL) 50 MG tablet Take 50 mg by mouth 2 (two)  times daily.    Marland Kitchen donepezil (ARICEPT) 10 MG tablet Take 10 mg by mouth every evening.   . escitalopram (LEXAPRO) 5 MG tablet Take one tablets by mouth at bedtime for mood  . furosemide (LASIX) 20 MG tablet Take 40 mg by mouth daily. Hold for systolic below BP below 814  . gabapentin (NEURONTIN) 300 MG capsule Take 300 mg by mouth 2 (two) times daily.    Marland Kitchen levothyroxine (SYNTHROID, LEVOTHROID) 50 MCG tablet Take 50 mcg by mouth daily.    Marland Kitchen lisinopril (PRINIVIL,ZESTRIL) 2.5 MG tablet Take 2.5 mg by mouth daily.  . memantine (NAMENDA) 10 MG tablet Take one tablet by mouth twice daily  . metFORMIN (GLUCOPHAGE) 500 MG tablet Take 500 mg by mouth 2 (two) times daily with a meal.    . metolazone (ZAROXOLYN) 2.5 MG tablet Take 2.5 mg by mouth daily. Give on Mon., Thu  . pravastatin (PRAVACHOL) 80 MG tablet Take 80 mg by mouth at bedtime.    . ranitidine (ZANTAC) 150 MG capsule Take 150 mg by mouth daily.   No facility-administered encounter medications on file as of 04/26/2017.     Review of Systems this is limited since patient is a poor historian   However when asked she is not complaining of any leg pain shortness of breath or increased cough she feels her legs are doing fine  Immunization History  Administered Date(s) Administered  . Influenza Whole 10/11/2006  . Influenza-Unspecified 03/19/2009, 12/30/2016  . Pneumococcal Conjugate-13 12/30/2016  . Td 01/14/2003  . Tdap 12/15/2016   Pertinent  Health Maintenance Due  Topic Date Due  . HEMOGLOBIN A1C  08/07/2017  . URINE MICROALBUMIN  10/21/2017  . FOOT EXAM  12/21/2017  . PNA vac Low Risk Adult (2 of 2 - PPSV23) 12/30/2017  . OPHTHALMOLOGY EXAM  01/17/2018  . INFLUENZA VACCINE  Completed  . DEXA SCAN  Completed   Fall Risk  12/15/2016  Falls in the past year? No   Functional Status Survey:    She is afebrile pulse is 80 respirations of 18 blood pressure manually was 114/74--weight is 245.  Pounds this is down from 249 pounds on  January 25  Physical Exam    in general this is a pleasant talkative elderly female no distress sitting comfortably on the side of her bed  Her skin is warm and dry does have venous stasis changes of her lower legs bilaterally I could not really tell much difference between the right versus left legs today there are some darkened areas slightly warm to touch but this does not appear to be overtly cellulitic-her legs do not appear to be tender.  I do not see any drainage at this time.  Eyes visual acuity appears grossly intact.  Chest is clear to auscultation largely just a few minimal rales at the bases-there is no labored breathing.  Heart is regular rate and rhythm without murmur gallop or rub she continues with fairly significant lower extremity edema does not appear greater than recent baseline.  Abdomen is obese soft nontender with positive bowel sounds.  Musculoskeletal moves all extremities x4 at baseline largely ambulates in her wheelchair strength appears to be intact all 4 extremities.  Neurologic continues to be alert again I do not see lateralizing findings cranial nerves are intact her speech is clear.  Psych she is oriented to self is conversational although somewhat confused-continues to be an active talker.      Labs reviewed: Recent Labs    03/01/17 0706 03/27/17 0400 04/26/17 0700  NA 143 141 138  K 4.3 3.9 4.0  CL 102 99* 93*  CO2 33* 34* 34*  GLUCOSE 134* 125* 118*  BUN 17 19 21*  CREATININE 1.09* 1.11* 1.25*  CALCIUM 8.7* 8.8* 9.2   Recent Labs    04/27/16 0730 08/31/16 1300 02/22/17 0708  AST 15 16 21   ALT 9* 10* 12*  ALKPHOS 56 64 72  BILITOT 0.4 0.3 0.7  PROT 6.4* 6.7 7.7  ALBUMIN 3.1* 3.3* 3.9   Recent Labs    09/27/16 0500  02/22/17 0708 03/01/17 0706 03/27/17 0400  WBC 5.0   < > 6.7 5.8 4.6  NEUTROABS 2.7  --  4.5  --  2.7  HGB 11.7*   < > 11.4* 11.2* 11.0*  HCT 39.8   < > 41.7 41.4 40.9  MCV 82.4   < > 83.2 83.5 82.0  PLT  259   < > 229 241 247   < > = values in this interval not displayed.   Lab Results  Component Value Date   TSH 1.667 02/07/2017   Lab Results  Component Value Date   HGBA1C 7.5 (H) 02/07/2017   Lab Results  Component Value Date   CHOL 150 11/26/2016   HDL 45 11/26/2016   LDLCALC 85 11/26/2016   TRIG 99 11/26/2016   CHOLHDL 3.3 11/26/2016    Significant Diagnostic Results in last 30 days:  No results found.  Assessment/Plan  #1-venous stasis edema with possible cellulitis- this appears stabilized she is receiving Bactroban this appears to be helping I do not see progression of any possible cellulitis  I do not see drainage-this was discussed with Dr. Lyndel Safe in the facility and will continue current treatment.  Regards to edema will continue the metolazone will cut back to 3 times a week and continue the Lasix 40 mg a day- will need to update a metabolic panel early next week to ensure stability of electrolytes and renal function.  2.  History of chronic kidney disease again creatinine of 1.25 BUN 21 appears slightly rising but not precipitously so will recheck this again early next week.--This was discussed with Dr. Lyndel Safe as well  [phone]

## 2017-05-01 ENCOUNTER — Encounter (HOSPITAL_COMMUNITY)
Admission: RE | Admit: 2017-05-01 | Discharge: 2017-05-01 | Disposition: A | Discharge status: Home / Self Care | Payer: Medicare Other | Source: Other Acute Inpatient Hospital | Attending: Internal Medicine | Admitting: Internal Medicine

## 2017-05-01 DIAGNOSIS — E785 Hyperlipidemia, unspecified: Secondary | ICD-10-CM | POA: Diagnosis not present

## 2017-05-01 LAB — BASIC METABOLIC PANEL
Anion gap: 13 (ref 5–15)
BUN: 21 mg/dL — AB (ref 6–20)
CO2: 32 mmol/L (ref 22–32)
CREATININE: 1.17 mg/dL — AB (ref 0.44–1.00)
Calcium: 8.9 mg/dL (ref 8.9–10.3)
Chloride: 95 mmol/L — ABNORMAL LOW (ref 101–111)
GFR calc Af Amer: 45 mL/min — ABNORMAL LOW (ref >60)
GFR calc non Af Amer: 39 mL/min — ABNORMAL LOW (ref >60)
GLUCOSE: 126 mg/dL — AB (ref 65–99)
POTASSIUM: 3.9 mmol/L (ref 3.5–5.1)
SODIUM: 140 mmol/L (ref 135–145)

## 2017-05-08 ENCOUNTER — Non-Acute Institutional Stay (SKILLED_NURSING_FACILITY): Payer: Medicare Other | Admitting: Internal Medicine

## 2017-05-08 ENCOUNTER — Encounter: Payer: Self-pay | Admitting: Internal Medicine

## 2017-05-08 DIAGNOSIS — F0280 Dementia in other diseases classified elsewhere without behavioral disturbance: Secondary | ICD-10-CM | POA: Diagnosis not present

## 2017-05-08 DIAGNOSIS — E1151 Type 2 diabetes mellitus with diabetic peripheral angiopathy without gangrene: Secondary | ICD-10-CM | POA: Diagnosis not present

## 2017-05-08 DIAGNOSIS — I10 Essential (primary) hypertension: Secondary | ICD-10-CM

## 2017-05-08 DIAGNOSIS — I739 Peripheral vascular disease, unspecified: Secondary | ICD-10-CM | POA: Diagnosis not present

## 2017-05-08 DIAGNOSIS — E038 Other specified hypothyroidism: Secondary | ICD-10-CM | POA: Diagnosis not present

## 2017-05-08 DIAGNOSIS — R600 Localized edema: Secondary | ICD-10-CM | POA: Diagnosis not present

## 2017-05-08 DIAGNOSIS — G309 Alzheimer's disease, unspecified: Secondary | ICD-10-CM | POA: Diagnosis not present

## 2017-05-08 DIAGNOSIS — E782 Mixed hyperlipidemia: Secondary | ICD-10-CM

## 2017-05-08 NOTE — Progress Notes (Signed)
Location:   [facility] Room Number: 149/W Place of Service:  SNF [id number]) Provider:  Tera Helper, MD  Patient Care Team: Fayrene Helper, MD as PCP - General  Extended Emergency Contact Information Primary Emergency Contact: Valarie Merino Address: [place], [place] of [place] Phone: [phone] Work Phone: [phone] Relation: Daughter  Code Status:  Full Code Goals of care: Advanced Directive information Advanced Directives 05/08/2017  Does Patient Have a Medical Advance Directive? Yes  Type of Advance Directive (No Data)  Does patient want to make changes to medical advance directive? No - Patient declined  Copy of [place] in Chart? -     Chief Complaint  Patient presents with  . Medical Management of Chronic Issues    Routine Visit  For medical management of chronic medical conditions including dementia-hypertension-edema-diabetes type 2-hypothyroidism-hyperlipidemia- neuropathy-peripheral vascular disease  HPI:  Pt is a 84 y.o. female seen today for medical management of chronic diseases as noted above.  Most recent acute issue with some increased edema and weight gain she gained about 20 pounds over a period of several weeks- Dr. Lyndel Safe did adjust her diuretics including adding metolazone-this appears to be helping her weight now is back closer to her baseline back in September at around 240 pounds edema appears to be improved.  She also received topical antibiotic for possible slight cellulitis on her right lower leg and this appears to be improved as well.  In regards to other issues she is a type II diabetic on Glucophage hemoglobin A1c was 7.5 on lab done in November 2018-blood sugars appear to be largely in the lower 100s.  She also has a history of hypertension which ihasbeen stable on lisinopril Lasix and metolazone blood pressure today 120/66  previous blood pressure 138/79 I do not see consistent elevations.   also has a history of hypothyroidism TSH was within normal limits back in November at 1.667.  In regards to hyperlipidemia she is on a statin LDL was 85 on lab in September 2018.  She did have complaints of possible peripheral neuropathy numbness and  her Neurontin appears to be helping with this she is on twice a day.  This is complicated somewhat with a history of peripheral vascular disease she has been on Pletal and aspirin long-term and this also appears to be stable  She had a she continues on low-dose Lexapro with a history of depression at one point fairly significant change in mood but this was an extended time ago and since Lexapro was started she appears to be largely at her baseline   Past Medical History:  Diagnosis Date  . Acute cystitis   . Dementia   . Dermatomycosis, unspecified   . DJD (degenerative joint disease)    Spine  . DM (diabetes mellitus) ([place])   . GERD (gastroesophageal reflux disease)   . History of pulmonary embolism   . History of supraventricular tachycardia   . Hyperlipidemia   . Hypertension   . Hypothyroidism   . Long term (current) use of anticoagulants    coumadin  . Obesity   . Other malaise and fatigue   . PVD (peripheral vascular disease) ([place])    Past Surgical History:  Procedure Laterality Date  . ATRIAL ABLATION SURGERY  01/2003   Catheter, to tachic palpitations  . MASTECTOMY  2003   Left  . THYROIDECTOMY, PARTIAL  1962    Allergies  Allergen Reactions  . Contrast Media [Iodinated Diagnostic Agents] Other (See Comments)    Unknown-listed on MAR-patient is not aware of these allergies  . Latex Other (See Comments)    Unknown-listed on MAR-patient is not aware of these allergies    Outpatient Encounter Medications as of 05/08/2017  Medication Sig  . acetaminophen (TYLENOL) 325 MG tablet Take 650 mg by mouth 2 (two) times daily.  Marland Kitchen aspirin 81 MG tablet  Take 81 mg by mouth daily.   Roseanne Kaufman [place]-Castor Oil (VENELEX) OINT Apply to sacrum and bilateral buttocks each shift and prn for prevention  . calcium-vitamin D (OSCAL WITH D) 500-200 MG-UNIT per tablet Take 1 tablet by mouth 2 (two) times daily.   . cholecalciferol (VITAMIN D) 1000 units tablet Take 1,000 Units by mouth 2 (two) times daily.   . cilostazol (PLETAL) 50 MG tablet Take 50 mg by mouth 2 (two) times daily.    Marland Kitchen donepezil (ARICEPT) 10 MG tablet Take 10 mg by mouth every evening.   . escitalopram (LEXAPRO) 5 MG tablet Take one tablets by mouth at bedtime for mood  . furosemide (LASIX) 20 MG tablet Take 40 mg by mouth daily. Hold for systolic below BP below 791  . gabapentin (NEURONTIN) 300 MG capsule Take 300 mg by mouth 2 (two) times daily.    Marland Kitchen levothyroxine (SYNTHROID, LEVOTHROID) 50 MCG tablet Take 50 mcg by mouth daily.    Marland Kitchen lisinopril (PRINIVIL,ZESTRIL) 2.5 MG tablet Take 2.5 mg by mouth daily.  . memantine (NAMENDA) 10 MG tablet Take one tablet by mouth twice daily  . metFORMIN (GLUCOPHAGE) 500 MG tablet Take 500 mg by mouth 2 (two) times daily with a meal.    . metolazone (ZAROXOLYN) 2.5 MG tablet Take 2.5 mg by mouth daily. Give on Mon., Wed., Fri.  . pravastatin (PRAVACHOL) 80 MG tablet Take 80 mg by mouth at bedtime.    . ranitidine (ZANTAC) 150 MG capsule Take 150 mg by mouth daily.   No facility-administered encounter medications on file as of 05/08/2017.      Review of Systems   This is limited somewhat the patient being a poor historian.  General is not complaining of fever chills she has had some weight loss but this is actually desired as noted above.  Skin is not complain of rashes or itching.  Head ears eyes nose mouth and throat does not complain of visual changes or sore throat.  Respiratory at one time was treated for pneumonia but this is been stable for some time is not complaining of shortness of breath or cough today.  Cardiac is not complaining  of chest pain palpitations edema appears to be somewhat improved.  GI is not complaining of any abdominal discomfort nausea vomiting diarrhea or constipation.appears  She has a pretty good appetite although she says it is intermitten  GU does not complain of dysuria.  Musculoskeletal is not currently complaining of joint pain.  Neurologic does not complain of dizziness headache or numbness at this time.  Psych does have a history of dementia and depression but this has been quite stable for some time she is not complaining of being anxious or depressed today  Immunization History  Administered Date(s) Administered  . Influenza Whole 10/11/2006  . Influenza-Unspecified 03/19/2009, 12/30/2016  . Pneumococcal Conjugate-13 12/30/2016  . Td 01/14/2003  . Tdap 12/15/2016   Pertinent  Health Maintenance Due  Topic Date Due  . HEMOGLOBIN A1C  08/07/2017  .  URINE MICROALBUMIN  10/21/2017  . FOOT EXAM  12/21/2017  . PNA vac Low Risk Adult (2 of 2 - PPSV23) 12/30/2017  . OPHTHALMOLOGY EXAM  01/17/2018  . INFLUENZA VACCINE  Completed  . DEXA SCAN  Completed   Fall Risk  12/15/2016  Falls in the past year? No   Functional Status Survey:    Vitals:   05/08/17 1452  BP: 120/66  Pulse: 62  Resp: 15  Temp: 98 F (36.7 C)  TempSrc: Oral  SpO2: 95%  Weight: 238 lb 6.4 oz (108.1 kg)  Height: 5\' 9"  (1.753 m)   Body mass index is 35.21 kg/m. Physical Exam   In general this is a pleasant elderly female who appears to be at her baseline she is quite talkative which is normal for her.  Her skin is warm and dry does have venous stasis changes of her lower legs but I do not really note any increased erythema or drainage.  Eyes visual acuity appears grossly intact sclera are clear.  Oropharynx is clear mucous membranes moist.  Chest is clear to auscultation there is no labored breathing.  Heart is regular rate and rhythm without murmur gallop or rub her edema appears to be somewhat  improved compared to previous exam-she still has some venous stasis edema lower extremities but again appears somewhat improved.  Abdomen is soft nontender with positive bowel sounds. Musculoskeletal moves all her extremities at baseline Ambulates in a wheelchair most of the time strength appears to be intact all 4 extremities.  Neurologic could not really appreciate lateralizing findings-her speech is clear she is alert appears to be at her baseline cranial nerves intact.  Psych she is oriented to self pleasant conversational good spirits which is her baseline.      Labs reviewed: Recent Labs    03/27/17 0400 04/26/17 0700 05/01/17 0300  NA 141 138 140  K 3.9 4.0 3.9  CL 99* 93* 95*  CO2 34* 34* 32  GLUCOSE 125* 118* 126*  BUN 19 21* 21*  CREATININE 1.11* 1.25* 1.17*  CALCIUM 8.8* 9.2 8.9   Recent Labs    08/31/16 1300 02/22/17 0708  AST 16 21  ALT 10* 12*  ALKPHOS 64 72  BILITOT 0.3 0.7  PROT 6.7 7.7  ALBUMIN 3.3* 3.9   Recent Labs    09/27/16 0500  02/22/17 0708 03/01/17 0706 03/27/17 0400  WBC 5.0   < > 6.7 5.8 4.6  NEUTROABS 2.7  --  4.5  --  2.7  HGB 11.7*   < > 11.4* 11.2* 11.0*  HCT 39.8   < > 41.7 41.4 40.9  MCV 82.4   < > 83.2 83.5 82.0  PLT 259   < > 229 241 247   < > = values in this interval not displayed.   Lab Results  Component Value Date   TSH 1.667 02/07/2017   Lab Results  Component Value Date   HGBA1C 7.5 (H) 02/07/2017   Lab Results  Component Value Date   CHOL 150 11/26/2016   HDL 45 11/26/2016   LDLCALC 85 11/26/2016   TRIG 99 11/26/2016   CHOLHDL 3.3 11/26/2016    Significant Diagnostic Results in last 30 days:  No results found.  Assessment/Plan  #1-history of increased edema-again adjustments were made she is now on Lasix 40 mg a day as well as metolazone 2.5 mg 3 times a week- her edema and weight have moderated which is encouraging-will update a metabolic panel secondary to the  initiation of metolazone to make  sure renal function electrolytes remained stable.  2.-  History of dementia this appears stable she is on Namenda and Aricept continues to be interactive talkative in good spirits he does very well with supportive care.  3.  History of type 2 diabetes CBGs have been largely in the lower 100s for she is on Glucophage-hemoglobin A1c was 7.5 back in November considering her advanced age and risk of hypoglycemia this is satisfactory.  4.-Hypertension this is stable on lisinopril as well as Lasix and metolazone as noted above.  5.  History of hypothyroidism she is on Synthroid this is been stable also her significant amount of time TSH done in November 2018 was 1.667.  6.-History of hyperlipidemia this is stable as well on a statin LDL was 85 on September 2018 lab.  7.  History of GERD she is on Zantac previously been on Prilosec she appears to have tolerated the change well appears to be relatively asymptomatic.  8.  History of peripheral neuropathy most likely diabetic related this appears to be stabilized on the Neurontin twice daily.  Does not complain of any numbness or tingling today  9.  History of peripheral vascular disease continue plateau on aspirin this appears to be stable as well.  10.  History of vitamin D deficiency vitamin D level was 35 on lab done in September 2018 she continues on supplementation.  11 history of depression she is on low-dose Lexapro this is stable as well and has been for an extended period of time  #12 history of anemia hemoglobin of 11.0 on lab done in December showed stability but will monitor periodically.  [id number]

## 2017-05-09 ENCOUNTER — Encounter (HOSPITAL_COMMUNITY)
Admission: RE | Admit: 2017-05-09 | Discharge: 2017-05-09 | Disposition: A | Discharge status: Home / Self Care | Payer: Medicare Other | Source: Skilled Nursing Facility | Attending: Internal Medicine | Admitting: Internal Medicine

## 2017-05-09 DIAGNOSIS — I10 Essential (primary) hypertension: Secondary | ICD-10-CM | POA: Diagnosis present

## 2017-05-09 DIAGNOSIS — F329 Major depressive disorder, single episode, unspecified: Secondary | ICD-10-CM | POA: Insufficient documentation

## 2017-05-09 DIAGNOSIS — G4700 Insomnia, unspecified: Secondary | ICD-10-CM | POA: Diagnosis present

## 2017-05-09 DIAGNOSIS — R609 Edema, unspecified: Secondary | ICD-10-CM | POA: Diagnosis present

## 2017-05-09 DIAGNOSIS — I739 Peripheral vascular disease, unspecified: Secondary | ICD-10-CM | POA: Insufficient documentation

## 2017-05-09 DIAGNOSIS — K219 Gastro-esophageal reflux disease without esophagitis: Secondary | ICD-10-CM | POA: Insufficient documentation

## 2017-05-09 DIAGNOSIS — G629 Polyneuropathy, unspecified: Secondary | ICD-10-CM | POA: Diagnosis present

## 2017-05-09 LAB — BASIC METABOLIC PANEL
ANION GAP: 10 (ref 5–15)
BUN: 16 mg/dL (ref 6–20)
CO2: 30 mmol/L (ref 22–32)
Calcium: 9 mg/dL (ref 8.9–10.3)
Chloride: 100 mmol/L — ABNORMAL LOW (ref 101–111)
Creatinine, Ser: 0.97 mg/dL (ref 0.44–1.00)
GFR calc Af Amer: 57 mL/min — ABNORMAL LOW (ref >60)
GFR, EST NON AFRICAN AMERICAN: 49 mL/min — AB (ref >60)
Glucose, Bld: 128 mg/dL — ABNORMAL HIGH (ref 65–99)
POTASSIUM: 4.1 mmol/L (ref 3.5–5.1)
SODIUM: 140 mmol/L (ref 135–145)

## 2017-06-01 ENCOUNTER — Non-Acute Institutional Stay (SKILLED_NURSING_FACILITY): Payer: Medicare Other | Admitting: Internal Medicine

## 2017-06-01 ENCOUNTER — Encounter: Payer: Self-pay | Admitting: Internal Medicine

## 2017-06-01 DIAGNOSIS — I10 Essential (primary) hypertension: Secondary | ICD-10-CM

## 2017-06-01 DIAGNOSIS — R600 Localized edema: Secondary | ICD-10-CM | POA: Diagnosis not present

## 2017-06-01 DIAGNOSIS — F0280 Dementia in other diseases classified elsewhere without behavioral disturbance: Secondary | ICD-10-CM

## 2017-06-01 DIAGNOSIS — E038 Other specified hypothyroidism: Secondary | ICD-10-CM

## 2017-06-01 DIAGNOSIS — E1151 Type 2 diabetes mellitus with diabetic peripheral angiopathy without gangrene: Secondary | ICD-10-CM

## 2017-06-01 DIAGNOSIS — G309 Alzheimer's disease, unspecified: Secondary | ICD-10-CM

## 2017-06-01 DIAGNOSIS — K219 Gastro-esophageal reflux disease without esophagitis: Secondary | ICD-10-CM | POA: Diagnosis not present

## 2017-06-01 DIAGNOSIS — E782 Mixed hyperlipidemia: Secondary | ICD-10-CM

## 2017-06-01 DIAGNOSIS — I739 Peripheral vascular disease, unspecified: Secondary | ICD-10-CM

## 2017-06-01 NOTE — Progress Notes (Signed)
This encounter was created in error - please disregard.

## 2017-06-01 NOTE — Progress Notes (Signed)
Location:   [facility] Room Number: 149/W Place of Service:  SNF [phone]) Provider:  Thurmond Butts, MD  Patient Care Team: Fayrene Helper, MD as PCP - General  Extended Emergency Contact Information Primary Emergency Contact: Valarie Merino Address: [place], [place] Phone: [phone] Work Phone: [phone] Relation: Daughter  Code Status:  Full Code Goals of care: Advanced Directive information Advanced Directives 06/01/2017  Does Patient Have a Medical Advance Directive? Yes  Type of Advance Directive (No Data)  Does patient want to make changes to medical advance directive? No - Patient declined  Copy of [place] in Chart? -     Chief Complaint  Patient presents with  . Medical Management of Chronic Issues    Routine Visit    HPI:  Pt is a 84 y.o. female seen today for medical management of chronic diseases.     Patient has H/O Dementia, Diabetes Type 2, LE Edema, Hypothyroidism, Peripheral vascular disease and Chronic Venous stasis, Hyperlipidemia. Patient is doing well in facility. Her appetite is good. She eats lot of snacks which her family brings for her. Her weight is down 15 pounds to 235 after she was started on Metolazone. Her edema is much better and she is doing better with her breathing. There were no new Nursing issues.    Past Medical History:  Diagnosis Date  . Acute cystitis   . Dementia   . Dermatomycosis, unspecified   . DJD (degenerative joint disease)    Spine  . DM (diabetes mellitus) ([place])   . GERD (gastroesophageal reflux disease)   . History of pulmonary embolism   . History of supraventricular tachycardia   . Hyperlipidemia   . Hypertension   . Hypothyroidism   . Long term (current) use of anticoagulants    coumadin  . Obesity   . Other malaise and fatigue   . PVD (peripheral vascular disease) ([place])     Past Surgical History:  Procedure Laterality Date  . ATRIAL ABLATION SURGERY  01/2003   Catheter, to tachic palpitations  . MASTECTOMY  2003   Left  . THYROIDECTOMY, PARTIAL  1962    Allergies  Allergen Reactions  . Contrast Media [Iodinated Diagnostic Agents] Other (See Comments)    Unknown-listed on MAR-patient is not aware of these allergies  . Latex Other (See Comments)    Unknown-listed on MAR-patient is not aware of these allergies    Outpatient Encounter Medications as of 06/01/2017  Medication Sig  . acetaminophen (TYLENOL) 325 MG tablet Take 650 mg by mouth 2 (two) times daily.  Marland Kitchen aspirin 81 MG tablet Take 81 mg by mouth daily.   Roseanne Kaufman [place]-Castor Oil (VENELEX) OINT Apply to sacrum and bilateral buttocks each shift and prn for prevention  . calcium-vitamin D (OSCAL WITH D) 500-200 MG-UNIT per tablet Take 1 tablet by mouth 2 (two) times daily.   . cholecalciferol (VITAMIN D) 1000 units tablet Take 1,000 Units by mouth 2 (two) times daily.   . cilostazol (PLETAL) 50 MG tablet Take 50 mg by mouth 2 (two) times daily.    Marland Kitchen donepezil (ARICEPT) 10 MG tablet Take 10 mg by mouth every evening.   . escitalopram (LEXAPRO) 5 MG tablet Take one tablets by mouth at bedtime for mood  . furosemide (LASIX) 20 MG tablet Take 40 mg by mouth daily. Hold for systolic below  BP below 100  . gabapentin (NEURONTIN) 300 MG capsule Take 300 mg by mouth 2 (two) times daily.    Marland Kitchen levothyroxine (SYNTHROID, LEVOTHROID) 50 MCG tablet Take 50 mcg by mouth daily.    Marland Kitchen lisinopril (PRINIVIL,ZESTRIL) 2.5 MG tablet Take 2.5 mg by mouth daily.  . memantine (NAMENDA) 10 MG tablet Take one tablet by mouth twice daily  . metFORMIN (GLUCOPHAGE) 500 MG tablet Take 500 mg by mouth 2 (two) times daily with a meal.    . metolazone (ZAROXOLYN) 2.5 MG tablet Take 2.5 mg by mouth daily. Give on Mon., Wed., Fri.  . pravastatin (PRAVACHOL) 80 MG tablet Take 80 mg by mouth at bedtime.    . ranitidine (ZANTAC) 150 MG  capsule Take 150 mg by mouth daily.   No facility-administered encounter medications on file as of 06/01/2017.      Review of Systems  Not reliable but denies everything says 'I am fine'  Immunization History  Administered Date(s) Administered  . Influenza Whole 10/11/2006  . Influenza-Unspecified 03/19/2009, 12/30/2016  . Pneumococcal Conjugate-13 12/30/2016  . Td 01/14/2003  . Tdap 12/15/2016   Pertinent  Health Maintenance Due  Topic Date Due  . HEMOGLOBIN A1C  08/07/2017  . URINE MICROALBUMIN  10/21/2017  . FOOT EXAM  12/21/2017  . PNA vac Low Risk Adult (2 of 2 - PPSV23) 12/30/2017  . OPHTHALMOLOGY EXAM  01/17/2018  . INFLUENZA VACCINE  Completed  . DEXA SCAN  Completed   Fall Risk  12/15/2016  Falls in the past year? No   Functional Status Survey:    Vitals:   06/01/17 1316  BP: 133/66  Pulse: 68  Resp: 17  Temp: 97.8 F (36.6 C)  TempSrc: Oral  SpO2: 95%  Weight: 235 lb (106.6 kg)  Height: 5\' 9"  (1.753 m)   Body mass index is 34.7 kg/m. Physical Exam  Constitutional: She appears well-developed and well-nourished.  HENT:  Head: Normocephalic.  Mouth/Throat: Oropharynx is clear and moist.  Eyes: Pupils are equal, round, and reactive to light.  Neck: Neck supple.  Cardiovascular: Normal rate and normal heart sounds.  Pulmonary/Chest: Effort normal and breath sounds normal. No respiratory distress. She has no wheezes. She has no rales.  Abdominal: Soft. Bowel sounds are normal. She exhibits no distension. There is no tenderness. There is no rebound.  Musculoskeletal:  Moderate Edema Bilateral  Lymphadenopathy:    She has no cervical adenopathy.  Neurological: She is alert.  Skin: Skin is warm and dry.  Psychiatric: She has a normal mood and affect. Her behavior is normal.    Labs reviewed: Recent Labs    04/26/17 0700 05/01/17 0300 05/09/17 0712  NA 138 140 140  K 4.0 3.9 4.1  CL 93* 95* 100*  CO2 34* 32 30  GLUCOSE 118* 126* 128*  BUN 21*  21* 16  CREATININE 1.25* 1.17* 0.97  CALCIUM 9.2 8.9 9.0   Recent Labs    08/31/16 1300 02/22/17 0708  AST 16 21  ALT 10* 12*  ALKPHOS 64 72  BILITOT 0.3 0.7  PROT 6.7 7.7  ALBUMIN 3.3* 3.9   Recent Labs    09/27/16 0500  02/22/17 0708 03/01/17 0706 03/27/17 0400  WBC 5.0   < > 6.7 5.8 4.6  NEUTROABS 2.7  --  4.5  --  2.7  HGB 11.7*   < > 11.4* 11.2* 11.0*  HCT 39.8   < > 41.7 41.4 40.9  MCV 82.4   < > 83.2 83.5 82.0  PLT 259   < > 229 241 247   < > = values in this interval not displayed.   Lab Results  Component Value Date   TSH 1.667 02/07/2017   Lab Results  Component Value Date   HGBA1C 7.5 (H) 02/07/2017   Lab Results  Component Value Date   CHOL 150 11/26/2016   HDL 45 11/26/2016   LDLCALC 85 11/26/2016   TRIG 99 11/26/2016   CHOLHDL 3.3 11/26/2016    Significant Diagnostic Results in last 30 days:  No results found.  Assessment/Plan  Lower extremity edema Cannot do echo on Ms. Sabourin as she does not like to go to facility for testing But she has done well on Metolazone and lasix Repeat BMP  Diabetes mellitus with nephropathy and neuropathy  Her blood sugars run less than 150 A1C  was 7.5 in 011/18 Continue on same dose of Metformin  will check Vit B12 level Also on Neurontin Will repeat A1C  Essential hypertension Stable Continue on Lisinopril and Lasix Peripheral vascular disease Continue on aspirin and Pletal  Hypothyroidism TSH normal in 11/18  Alzheimer's dementia Stable on Aricept and Namenda  Depression Will Continue Lexapro Hyperlipidemia Continue pravastatin.  LDL 85 in 09/18 GERD Changed Prilosec to Zantac  DEXA scan was Tscore -.1.Marland Kitchen12 in 08/18  Family/ staff Communication:   Labs/tests ordered:  BMP,CBC, Vit B12   Total time spent in this patient care encounter was 25_ minutes; greater than 50% of the visit spent counseling patient, reviewing records , Labs and coordinating care for problems addressed at  this encounter.

## 2017-06-02 ENCOUNTER — Encounter (HOSPITAL_COMMUNITY)
Admission: RE | Admit: 2017-06-02 | Discharge: 2017-06-02 | Disposition: A | Discharge status: Home / Self Care | Payer: Medicare Other | Source: Skilled Nursing Facility | Attending: Internal Medicine | Admitting: Internal Medicine

## 2017-06-02 DIAGNOSIS — G629 Polyneuropathy, unspecified: Secondary | ICD-10-CM | POA: Diagnosis not present

## 2017-06-02 DIAGNOSIS — F329 Major depressive disorder, single episode, unspecified: Secondary | ICD-10-CM | POA: Diagnosis present

## 2017-06-02 DIAGNOSIS — I739 Peripheral vascular disease, unspecified: Secondary | ICD-10-CM | POA: Diagnosis not present

## 2017-06-02 DIAGNOSIS — I10 Essential (primary) hypertension: Secondary | ICD-10-CM | POA: Insufficient documentation

## 2017-06-02 DIAGNOSIS — G4700 Insomnia, unspecified: Secondary | ICD-10-CM | POA: Diagnosis present

## 2017-06-02 DIAGNOSIS — K219 Gastro-esophageal reflux disease without esophagitis: Secondary | ICD-10-CM | POA: Diagnosis not present

## 2017-06-02 LAB — VITAMIN B12: VITAMIN B 12: 492 pg/mL (ref 180–914)

## 2017-06-02 LAB — CBC
HCT: 40.7 % (ref 36.0–46.0)
Hemoglobin: 11.4 g/dL — ABNORMAL LOW (ref 12.0–15.0)
MCH: 23 pg — ABNORMAL LOW (ref 26.0–34.0)
MCHC: 28 g/dL — ABNORMAL LOW (ref 30.0–36.0)
MCV: 82.1 fL (ref 78.0–100.0)
PLATELETS: 211 10*3/uL (ref 150–400)
RBC: 4.96 MIL/uL (ref 3.87–5.11)
RDW: 17.7 % — AB (ref 11.5–15.5)
WBC: 4.1 10*3/uL (ref 4.0–10.5)

## 2017-06-02 LAB — BASIC METABOLIC PANEL
ANION GAP: 10 (ref 5–15)
BUN: 14 mg/dL (ref 6–20)
CALCIUM: 8.9 mg/dL (ref 8.9–10.3)
CO2: 29 mmol/L (ref 22–32)
Chloride: 101 mmol/L (ref 101–111)
Creatinine, Ser: 1.06 mg/dL — ABNORMAL HIGH (ref 0.44–1.00)
GFR calc Af Amer: 51 mL/min — ABNORMAL LOW (ref >60)
GFR, EST NON AFRICAN AMERICAN: 44 mL/min — AB (ref >60)
Glucose, Bld: 134 mg/dL — ABNORMAL HIGH (ref 65–99)
POTASSIUM: 4 mmol/L (ref 3.5–5.1)
SODIUM: 140 mmol/L (ref 135–145)

## 2017-06-03 LAB — HEMOGLOBIN A1C
HEMOGLOBIN A1C: 7.3 % — AB (ref 4.8–5.6)
MEAN PLASMA GLUCOSE: 163 mg/dL

## 2017-06-06 ENCOUNTER — Encounter (HOSPITAL_COMMUNITY)
Admission: RE | Admit: 2017-06-06 | Discharge: 2017-06-06 | Disposition: A | Discharge status: Home / Self Care | Payer: Medicare Other | Source: Skilled Nursing Facility | Attending: Internal Medicine | Admitting: Internal Medicine

## 2017-06-06 DIAGNOSIS — E119 Type 2 diabetes mellitus without complications: Secondary | ICD-10-CM | POA: Insufficient documentation

## 2017-06-06 LAB — BASIC METABOLIC PANEL
Anion gap: 12 (ref 5–15)
BUN: 13 mg/dL (ref 6–20)
CO2: 30 mmol/L (ref 22–32)
CREATININE: 0.97 mg/dL (ref 0.44–1.00)
Calcium: 9.1 mg/dL (ref 8.9–10.3)
Chloride: 100 mmol/L — ABNORMAL LOW (ref 101–111)
GFR calc Af Amer: 57 mL/min — ABNORMAL LOW (ref >60)
GFR calc non Af Amer: 49 mL/min — ABNORMAL LOW (ref >60)
GLUCOSE: 150 mg/dL — AB (ref 65–99)
Potassium: 4.2 mmol/L (ref 3.5–5.1)
SODIUM: 142 mmol/L (ref 135–145)

## 2017-06-12 ENCOUNTER — Encounter (HOSPITAL_COMMUNITY)
Admission: RE | Admit: 2017-06-12 | Discharge: 2017-06-12 | Disposition: A | Discharge status: Home / Self Care | Payer: Medicare Other | Source: Skilled Nursing Facility | Attending: *Deleted | Admitting: *Deleted

## 2017-06-12 DIAGNOSIS — I10 Essential (primary) hypertension: Secondary | ICD-10-CM | POA: Diagnosis not present

## 2017-06-12 LAB — VITAMIN B12: VITAMIN B 12: 473 pg/mL (ref 180–914)

## 2017-07-03 ENCOUNTER — Non-Acute Institutional Stay (SKILLED_NURSING_FACILITY): Payer: Medicare Other | Admitting: Internal Medicine

## 2017-07-03 ENCOUNTER — Encounter: Payer: Self-pay | Admitting: Internal Medicine

## 2017-07-03 DIAGNOSIS — R059 Cough, unspecified: Secondary | ICD-10-CM

## 2017-07-03 DIAGNOSIS — I739 Peripheral vascular disease, unspecified: Secondary | ICD-10-CM | POA: Diagnosis not present

## 2017-07-03 DIAGNOSIS — R05 Cough: Secondary | ICD-10-CM

## 2017-07-03 DIAGNOSIS — R600 Localized edema: Secondary | ICD-10-CM | POA: Diagnosis not present

## 2017-07-03 DIAGNOSIS — E1151 Type 2 diabetes mellitus with diabetic peripheral angiopathy without gangrene: Secondary | ICD-10-CM | POA: Diagnosis not present

## 2017-07-03 DIAGNOSIS — I10 Essential (primary) hypertension: Secondary | ICD-10-CM

## 2017-07-03 DIAGNOSIS — E038 Other specified hypothyroidism: Secondary | ICD-10-CM | POA: Diagnosis not present

## 2017-07-03 DIAGNOSIS — G309 Alzheimer's disease, unspecified: Secondary | ICD-10-CM

## 2017-07-03 DIAGNOSIS — F0280 Dementia in other diseases classified elsewhere without behavioral disturbance: Secondary | ICD-10-CM | POA: Diagnosis not present

## 2017-07-03 NOTE — Progress Notes (Signed)
Location:   [facility] Room Number: 149/W Place of Service:  SNF [phone]) Provider:  Tera Helper, MD  Patient Care Team: Fayrene Helper, MD as PCP - General  Extended Emergency Contact Information Primary Emergency Contact: Valarie Merino Address: [place], [place] Phone: [phone] Work Phone: [phone] Relation: Daughter  Code Status:  Full Code Goals of care: Advanced Directive information Advanced Directives 07/03/2017  Does Patient Have a Medical Advance Directive? Yes  Type of Advance Directive (No Data)  Does patient want to make changes to medical advance directive? No - Patient declined  Copy of [place] in Chart? -     Chief Complaint  Patient presents with  . Medical Management of Chronic Issues    Routine Visit   For medical management of chronic medical conditions including dementia-hypertension- type 2 diabetes-history of venous stasis lower extremity edema- hypothyroidism-hyperlipidemia- peripheral neuropathy-depression.  Acute visit secondary to cough   HPI:  Pt is a 84 y.o. female seen today for medical management of chronic diseases. --As noted above.  Is also being seen for a cough.  Currently patient has no complaints does not complain of any fever chills or shortness of breath ever she appears to have a fairly persistent dry cough.  Vital signs appear to be stable she is afebrile.  She does have past history of some respiratory issues at times and pneumonia.  Her other medical conditions appear to be fairly stable-she had some weight gain and increased edema and was started on metolazone 3 times a week in addition to her Lasix and this appears to be very effective she has lost a significant amount of weight recent weight is 216 approximately a month ago she was 230-edema appears to continually improve.  She does have a  history of type 2 diabetes this is well controlled on the 5- hemoglobin A1c showed stability at 7.3 on lab done last month blood sugars have been fairly consistently in the low 100s.  In regards to hypertension this appears stable as well on lisinopril metolazone and Lasix-I got a manual reading of 138/78 today previous reading 102/64-118/65.  She also has a history of peripheral vascular disease which appears to be relatively well controlled on Pletal as well as aspirin.  She also has complaint of peripheral neuropathy most likely diabetic related this appears stable on the twice daily dry.  At one point she was not her usual boisterous vocalself was thought to have an element of depression she was started on Lexapro low-dose has responded very well to this she has now been back at her baseline for an extended period of time  She also has a history of hypothyroidism is on Synthroid--this is been stable for some time with a TSH of 1.667 on lab done in November.--will update this   In regards to hyperlipidemia she is on a statin.--LDL was 85 on lab done back in September   Regards to dementia she appears well with supportive care she is on Aricept as well as Namenda     Past Medical History:  Diagnosis Date  . Acute cystitis   . Dementia   . Dermatomycosis, unspecified   . DJD (degenerative joint disease)    Spine  . DM (diabetes mellitus) ([place])   . GERD (gastroesophageal reflux disease)   . History of pulmonary embolism   . History of  supraventricular tachycardia   . Hyperlipidemia   . Hypertension   . Hypothyroidism   . Long term (current) use of anticoagulants    coumadin  . Obesity   . Other malaise and fatigue   . PVD (peripheral vascular disease) ([place])    Past Surgical History:  Procedure Laterality Date  . ATRIAL ABLATION SURGERY  01/2003   Catheter, to tachic palpitations  . MASTECTOMY  2003   Left  . THYROIDECTOMY, PARTIAL  1962    Allergies  Allergen  Reactions  . Contrast Media [Iodinated Diagnostic Agents] Other (See Comments)    Unknown-listed on MAR-patient is not aware of these allergies  . Latex Other (See Comments)    Unknown-listed on MAR-patient is not aware of these allergies    Outpatient Encounter Medications as of 07/03/2017  Medication Sig  . acetaminophen (TYLENOL) 325 MG tablet Take 650 mg by mouth 2 (two) times daily.  Marland Kitchen aspirin 81 MG tablet Take 81 mg by mouth daily.   Roseanne Kaufman [place]-Castor Oil (VENELEX) OINT Apply to sacrum and bilateral buttocks each shift and prn for prevention  . calcium-vitamin D (OSCAL WITH D) 500-200 MG-UNIT per tablet Take 1 tablet by mouth 2 (two) times daily.   . cholecalciferol (VITAMIN D) 1000 units tablet Take 1,000 Units by mouth 2 (two) times daily.   . cilostazol (PLETAL) 50 MG tablet Take 50 mg by mouth 2 (two) times daily.    Marland Kitchen donepezil (ARICEPT) 10 MG tablet Take 10 mg by mouth every evening.   . escitalopram (LEXAPRO) 5 MG tablet Take one tablets by mouth at bedtime for mood  . furosemide (LASIX) 20 MG tablet Take 40 mg by mouth daily. Hold for systolic below BP below 867  . gabapentin (NEURONTIN) 300 MG capsule Take 300 mg by mouth 2 (two) times daily.    Marland Kitchen levothyroxine (SYNTHROID, LEVOTHROID) 50 MCG tablet Take 50 mcg by mouth daily.    Marland Kitchen lisinopril (PRINIVIL,ZESTRIL) 2.5 MG tablet Take 2.5 mg by mouth daily.  . memantine (NAMENDA) 10 MG tablet Take one tablet by mouth twice daily  . metFORMIN (GLUCOPHAGE) 500 MG tablet Take 500 mg by mouth 2 (two) times daily with a meal.    . metolazone (ZAROXOLYN) 2.5 MG tablet Take 2.5 mg by mouth daily. Give on Mon., Wed., Fri.  . pravastatin (PRAVACHOL) 80 MG tablet Take 80 mg by mouth at bedtime.    . ranitidine (ZANTAC) 150 MG capsule Take 150 mg by mouth daily.   No facility-administered encounter medications on file as of 07/03/2017.      Review of Systems   Limited secondary to dementia--she essentially says she is doing well when  you ask her about most anything-she denies shortness of breath any chest pain abdominal discomfort  Immunization History  Administered Date(s) Administered  . Influenza Whole 10/11/2006  . Influenza-Unspecified 03/19/2009, 12/30/2016  . Pneumococcal Conjugate-13 12/30/2016  . Td 01/14/2003  . Tdap 12/15/2016   Pertinent  Health Maintenance Due  Topic Date Due  . URINE MICROALBUMIN  10/21/2017  . INFLUENZA VACCINE  10/26/2017  . HEMOGLOBIN A1C  12/03/2017  . FOOT EXAM  12/21/2017  . PNA vac Low Risk Adult (2 of 2 - PPSV23) 12/30/2017  . OPHTHALMOLOGY EXAM  01/17/2018  . DEXA SCAN  Completed   Fall Risk  12/15/2016  Falls in the past year? No   Functional Status Survey:    Vitals:   07/03/17 1443  BP: 102/64  Pulse: 84  Resp: 20  Temp: 98.3 F (36.8 C)  TempSrc: Oral  SpO2: 95%  Weight: 221 lb 6.4 oz (100.4 kg)  Height: 5\' 9"  (1.753 m)   update weight is 213 pounds  Body mass index is 32.7 kg/m. Physical Exam  In general this is a pleasant well-nourished appearing elderly female in no distress sitting comfortably in her wheelchair.  Her skin is warm and dry.  Eyes visual acuity appears to be intact sclera and conjunctive are clear.  Oropharynx is clear mucous membranes moist.  Chest some slight rales noted in anterior fields otherwise clear to auscultation there is no labored breathing.  Heart is regular rate and rhythm with occasional irregular beat her edema appears to be trace to 1+.  Abdomen is soft obese nontender with positive bowel sounds.  Musculoskeletal is able to move all extremities x4 strength appears to be intact all 4 extremities and at baseline she largely ambulates in a wheelchair.  Neurologic is grossly intact her speech is clear no lateralizing findings.  Psych she is oriented to self she is pleasant appropriate boisterous which is her baseline.     Labs reviewed: Recent Labs    05/09/17 0712 06/02/17 0725 06/06/17 0705  NA 140 140  142  K 4.1 4.0 4.2  CL 100* 101 100*  CO2 30 29 30   GLUCOSE 128* 134* 150*  BUN 16 14 13   CREATININE 0.97 1.06* 0.97  CALCIUM 9.0 8.9 9.1   Recent Labs    08/31/16 1300 02/22/17 0708  AST 16 21  ALT 10* 12*  ALKPHOS 64 72  BILITOT 0.3 0.7  PROT 6.7 7.7  ALBUMIN 3.3* 3.9   Recent Labs    09/27/16 0500  02/22/17 0708 03/01/17 0706 03/27/17 0400 06/02/17 0725  WBC 5.0   < > 6.7 5.8 4.6 4.1  NEUTROABS 2.7  --  4.5  --  2.7  --   HGB 11.7*   < > 11.4* 11.2* 11.0* 11.4*  HCT 39.8   < > 41.7 41.4 40.9 40.7  MCV 82.4   < > 83.2 83.5 82.0 82.1  PLT 259   < > 229 241 247 211   < > = values in this interval not displayed.   Lab Results  Component Value Date   TSH 1.667 02/07/2017   Lab Results  Component Value Date   HGBA1C 7.3 (H) 06/02/2017   Lab Results  Component Value Date   CHOL 150 11/26/2016   HDL 45 11/26/2016   LDLCALC 85 11/26/2016   TRIG 99 11/26/2016   CHOLHDL 3.3 11/26/2016    Significant Diagnostic Results in last 30 days:  No results found.  Assessment/Plan  #1 cough-with her respiratory issue in the past will be somewhat aggressive here in order a chest x-ray-also will start Mucinex 600 twice daily for 7 days and re-start nebulizers-duo nebs every 6 hours as needed for 7 days #2-  Dementia this appears to be stable on Aricept and Namenda she does well with supportive care has her usual personality.  3.  Type 2 diabetes this appears well controlled on Glucophage hemoglobin A1c 7.3 shows stability with CBGs largely in the lower 100s.  4.  History of lower extremity edema patient has been resistant to getting an echo done-she continues to lose weight-she is on metolazone 3 times a week she is also on Lasix daily will update a metabolic panel to ensure stability of electrolytes.  5.  History of hypothyroidism this appears to be stable for an extended period  of time with TSH within normal limits  6.  History of peripheral vascular disease this  appears stable on Pletal and aspirin she does not really have pain complaints today.  7.  History of peripheral neuropathy most likely diabetic related this appears stabilized she does not pain or stinging and burning of her legs today-she is on Neurontin twice daily.   #8 History of depression again this has been stable for an extended period of time on the Lexapro.  9.  History of hyperlipidemia this appears stable with an LDL of: 85 on lab done in September  #10- hypertension this appears well controlled as noted above on low-dose lisinopril also on metolazone and Lasix  #11-weight loss--thought -- most likely secondary to diuretics--will update BMP to ensure stability of renal function--this was discussed with Dr. Nilsa Nutting note greater than 35 minutes spent assessing patient- reviewing her chart labs- and coordinating and formulating a plan of care for numerous diagnoses- of note greater than 50% of time spent coordinating plan of care

## 2017-07-04 ENCOUNTER — Encounter (HOSPITAL_COMMUNITY)
Admission: RE | Admit: 2017-07-04 | Discharge: 2017-07-04 | Disposition: A | Discharge status: Home / Self Care | Payer: Medicare Other | Source: Skilled Nursing Facility | Attending: Internal Medicine | Admitting: Internal Medicine

## 2017-07-04 DIAGNOSIS — G629 Polyneuropathy, unspecified: Secondary | ICD-10-CM | POA: Insufficient documentation

## 2017-07-04 DIAGNOSIS — E039 Hypothyroidism, unspecified: Secondary | ICD-10-CM | POA: Insufficient documentation

## 2017-07-04 DIAGNOSIS — I739 Peripheral vascular disease, unspecified: Secondary | ICD-10-CM | POA: Diagnosis not present

## 2017-07-04 DIAGNOSIS — R609 Edema, unspecified: Secondary | ICD-10-CM | POA: Diagnosis not present

## 2017-07-04 DIAGNOSIS — F329 Major depressive disorder, single episode, unspecified: Secondary | ICD-10-CM | POA: Diagnosis not present

## 2017-07-04 DIAGNOSIS — G4700 Insomnia, unspecified: Secondary | ICD-10-CM | POA: Insufficient documentation

## 2017-07-04 DIAGNOSIS — I10 Essential (primary) hypertension: Secondary | ICD-10-CM | POA: Insufficient documentation

## 2017-07-04 DIAGNOSIS — K219 Gastro-esophageal reflux disease without esophagitis: Secondary | ICD-10-CM | POA: Insufficient documentation

## 2017-07-04 LAB — TSH: TSH: 1.087 u[IU]/mL (ref 0.350–4.500)

## 2017-07-04 LAB — BASIC METABOLIC PANEL
ANION GAP: 12 (ref 5–15)
BUN: 13 mg/dL (ref 6–20)
CHLORIDE: 98 mmol/L — AB (ref 101–111)
CO2: 30 mmol/L (ref 22–32)
Calcium: 9.2 mg/dL (ref 8.9–10.3)
Creatinine, Ser: 1.12 mg/dL — ABNORMAL HIGH (ref 0.44–1.00)
GFR calc Af Amer: 48 mL/min — ABNORMAL LOW (ref >60)
GFR calc non Af Amer: 41 mL/min — ABNORMAL LOW (ref >60)
GLUCOSE: 129 mg/dL — AB (ref 65–99)
POTASSIUM: 4 mmol/L (ref 3.5–5.1)
Sodium: 140 mmol/L (ref 135–145)

## 2017-07-16 ENCOUNTER — Encounter (HOSPITAL_COMMUNITY)
Admission: RE | Admit: 2017-07-16 | Discharge: 2017-07-16 | Disposition: A | Discharge status: Home / Self Care | Payer: Medicare Other | Source: Skilled Nursing Facility | Attending: *Deleted | Admitting: *Deleted

## 2017-07-16 DIAGNOSIS — R609 Edema, unspecified: Secondary | ICD-10-CM | POA: Diagnosis not present

## 2017-07-16 LAB — CBC
HCT: 41.8 % (ref 36.0–46.0)
Hemoglobin: 12.2 g/dL (ref 12.0–15.0)
MCH: 23.4 pg — ABNORMAL LOW (ref 26.0–34.0)
MCHC: 29.2 g/dL — ABNORMAL LOW (ref 30.0–36.0)
MCV: 80.2 fL (ref 78.0–100.0)
PLATELETS: 195 10*3/uL (ref 150–400)
RBC: 5.21 MIL/uL — ABNORMAL HIGH (ref 3.87–5.11)
RDW: 16.1 % — ABNORMAL HIGH (ref 11.5–15.5)
WBC: 4 10*3/uL (ref 4.0–10.5)

## 2017-07-16 LAB — BASIC METABOLIC PANEL
Anion gap: 12 (ref 5–15)
BUN: 23 mg/dL — ABNORMAL HIGH (ref 6–20)
CHLORIDE: 96 mmol/L — AB (ref 101–111)
CO2: 29 mmol/L (ref 22–32)
Calcium: 8.9 mg/dL (ref 8.9–10.3)
Creatinine, Ser: 1.35 mg/dL — ABNORMAL HIGH (ref 0.44–1.00)
GFR calc Af Amer: 38 mL/min — ABNORMAL LOW (ref >60)
GFR, EST NON AFRICAN AMERICAN: 33 mL/min — AB (ref >60)
Glucose, Bld: 123 mg/dL — ABNORMAL HIGH (ref 65–99)
Potassium: 4 mmol/L (ref 3.5–5.1)
SODIUM: 137 mmol/L (ref 135–145)

## 2017-07-17 ENCOUNTER — Non-Acute Institutional Stay (SKILLED_NURSING_FACILITY): Payer: Medicare Other | Admitting: Internal Medicine

## 2017-07-17 ENCOUNTER — Encounter: Payer: Self-pay | Admitting: Internal Medicine

## 2017-07-17 DIAGNOSIS — R634 Abnormal weight loss: Secondary | ICD-10-CM

## 2017-07-17 DIAGNOSIS — R7981 Abnormal blood-gas level: Secondary | ICD-10-CM | POA: Diagnosis not present

## 2017-07-17 NOTE — Progress Notes (Signed)
Location:   [facility] Room Number: 149/W Place of Service:  SNF [phone]) Provider:  Thurmond Butts, MD  Patient Care Team: Fayrene Helper, MD as PCP - General  Extended Emergency Contact Information Primary Emergency Contact: Valarie Merino Address: [place], [place] Phone: [phone] Work Phone: [phone] Relation: Daughter  Code Status:  Full Code Goals of care: Advanced Directive information Advanced Directives 07/17/2017  Does Patient Have a Medical Advance Directive? Yes  Type of Advance Directive (No Data)  Does patient want to make changes to medical advance directive? No - Patient declined  Copy of [place] in Chart? -     Chief Complaint  Patient presents with  . Acute Visit    Patients c/o O2 Stats have dropped    HPI:  Pt is a 84 y.o. female seen today for an acute visit as per Nurses she has been dropping her Stats on Room Air.needing Oxygen.  Patient has H/O Dementia, Diabetes Type 2, LE Edema, Hypothyroidism, Peripheral vascular disease and Chronic Venous stasis, Hyperlipidemia.  Patient unable to give me any history due to her dementia she also states she is feeling good.  Denies any shortness of breath cough chest pain abdominal pain.  But per nurses patient has been having cough and she has been dropping her oxygen sats to low 80s on room air. Patient also is losing weight. From My Last visit in 06/01/17 patient weighed 235 lbs and now weighs 212 lbs. She has almost loss 40 lbs on Past few months. Patient continues to eat per nurses and also has no complains of Nausea or vomiting.    Past Medical History:  Diagnosis Date  . Acute cystitis   . Dementia   . Dermatomycosis, unspecified   . DJD (degenerative joint disease)    Spine  . DM (diabetes mellitus) ([place])   . GERD (gastroesophageal reflux disease)   . History of  pulmonary embolism   . History of supraventricular tachycardia   . Hyperlipidemia   . Hypertension   . Hypothyroidism   . Long term (current) use of anticoagulants    coumadin  . Obesity   . Other malaise and fatigue   . PVD (peripheral vascular disease) ([place])    Past Surgical History:  Procedure Laterality Date  . ATRIAL ABLATION SURGERY  01/2003   Catheter, to tachic palpitations  . MASTECTOMY  2003   Left  . THYROIDECTOMY, PARTIAL  1962    Allergies  Allergen Reactions  . Contrast Media [Iodinated Diagnostic Agents] Other (See Comments)    Unknown-listed on MAR-patient is not aware of these allergies  . Latex Other (See Comments)    Unknown-listed on MAR-patient is not aware of these allergies    Outpatient Encounter Medications as of 07/17/2017  Medication Sig  . acetaminophen (TYLENOL) 325 MG tablet Take 650 mg by mouth 2 (two) times daily.  Marland Kitchen aspirin 81 MG tablet Take 81 mg by mouth daily.   Roseanne Kaufman [place]-Castor Oil (VENELEX) OINT Apply to sacrum and bilateral buttocks each shift and prn for prevention  . calcium-vitamin D (OSCAL WITH D) 500-200 MG-UNIT per tablet Take 1 tablet by mouth 2 (two) times daily.   . cholecalciferol (VITAMIN D) 1000 units tablet Take 1,000 Units by mouth 2 (two) times daily.   . cilostazol (PLETAL) 50 MG tablet Take 50 mg by mouth 2 (  two) times daily.    Marland Kitchen donepezil (ARICEPT) 10 MG tablet Take 10 mg by mouth every evening.   . escitalopram (LEXAPRO) 5 MG tablet Take one tablets by mouth at bedtime for mood  . furosemide (LASIX) 20 MG tablet Take 40 mg by mouth daily. Hold for systolic below BP below 195  . gabapentin (NEURONTIN) 300 MG capsule Take 300 mg by mouth 2 (two) times daily.    Marland Kitchen levothyroxine (SYNTHROID, LEVOTHROID) 50 MCG tablet Take 50 mcg by mouth daily.    Marland Kitchen lisinopril (PRINIVIL,ZESTRIL) 2.5 MG tablet Take 2.5 mg by mouth daily.  . memantine (NAMENDA) 10 MG tablet Take one tablet by mouth twice daily  . metFORMIN (GLUCOPHAGE)  500 MG tablet Take 500 mg by mouth 2 (two) times daily with a meal.    . metolazone (ZAROXOLYN) 2.5 MG tablet Take 2.5 mg by mouth daily. Give on Mon., Wed., Fri.  . pravastatin (PRAVACHOL) 80 MG tablet Take 80 mg by mouth at bedtime.    . ranitidine (ZANTAC) 150 MG capsule Take 150 mg by mouth daily.   No facility-administered encounter medications on file as of 07/17/2017.      Review of Systems Review of Systems  Constitutional: Negative for activity change, appetite change, chills, diaphoresis, fatigue and fever.  HENT: Negative for mouth sores, postnasal drip, rhinorrhea, sinus pain and sore throat.   Respiratory: Negative for apnea, cough, chest tightness, shortness of breath and wheezing.   Cardiovascular: Negative for chest pain, palpitations and leg swelling.  Gastrointestinal: Negative for abdominal distention, abdominal pain, constipation, diarrhea, nausea and vomiting.  Genitourinary: Negative for dysuria and frequency.  Musculoskeletal: Negative for arthralgias, joint swelling and myalgias.  Skin: Negative for rash.  Neurological: Negative for dizziness, syncope, weakness, light-headedness and numbness.  Psychiatric/Behavioral: Negative for behavioral problems, confusion and sleep disturbance.     Immunization History  Administered Date(s) Administered  . Influenza Whole 10/11/2006  . Influenza-Unspecified 03/19/2009, 12/30/2016  . Pneumococcal Conjugate-13 12/30/2016  . Td 01/14/2003  . Tdap 12/15/2016   Pertinent  Health Maintenance Due  Topic Date Due  . URINE MICROALBUMIN  10/21/2017  . INFLUENZA VACCINE  10/26/2017  . HEMOGLOBIN A1C  12/03/2017  . FOOT EXAM  12/21/2017  . PNA vac Low Risk Adult (2 of 2 - PPSV23) 12/30/2017  . OPHTHALMOLOGY EXAM  01/17/2018  . DEXA SCAN  Completed   Fall Risk  12/15/2016  Falls in the past year? No   Functional Status Survey:    Vitals:   07/17/17 0925  BP: 117/65  Pulse: 70  Resp: 17  Temp: 98.3 F (36.8 C)    TempSrc: Oral  SpO2: 98%   There is no height or weight on file to calculate BMI. Physical Exam  Constitutional: She appears well-developed and well-nourished.  HENT:  Head: Normocephalic.  Mouth/Throat: Oropharynx is clear and moist.  Eyes: Pupils are equal, round, and reactive to light.  Neck: Neck supple.  Cardiovascular: Normal rate and regular rhythm.  No murmur heard. Pulmonary/Chest: Effort normal and breath sounds normal. No stridor. No respiratory distress. She has no wheezes. She has no rales.  Abdominal: Soft. Bowel sounds are normal. She exhibits no distension. There is no tenderness. There is no guarding.  Musculoskeletal:  Mild Edema Bilateral  Neurological: She is alert.  Skin: Skin is warm and dry.  Psychiatric: She has a normal mood and affect. Her behavior is normal. Thought content normal.    Labs reviewed: Recent Labs    06/06/17 0705  07/04/17 0714 07/16/17 0930  NA 142 140 137  K 4.2 4.0 4.0  CL 100* 98* 96*  CO2 30 30 29   GLUCOSE 150* 129* 123*  BUN 13 13 23*  CREATININE 0.97 1.12* 1.35*  CALCIUM 9.1 9.2 8.9   Recent Labs    08/31/16 1300 02/22/17 0708  AST 16 21  ALT 10* 12*  ALKPHOS 64 72  BILITOT 0.3 0.7  PROT 6.7 7.7  ALBUMIN 3.3* 3.9   Recent Labs    09/27/16 0500  02/22/17 0708  03/27/17 0400 06/02/17 0725 07/16/17 0930  WBC 5.0   < > 6.7   < > 4.6 4.1 4.0  NEUTROABS 2.7  --  4.5  --  2.7  --   --   HGB 11.7*   < > 11.4*   < > 11.0* 11.4* 12.2  HCT 39.8   < > 41.7   < > 40.9 40.7 41.8  MCV 82.4   < > 83.2   < > 82.0 82.1 80.2  PLT 259   < > 229   < > 247 211 195   < > = values in this interval not displayed.   Lab Results  Component Value Date   TSH 1.087 07/04/2017   Lab Results  Component Value Date   HGBA1C 7.3 (H) 06/02/2017   Lab Results  Component Value Date   CHOL 150 11/26/2016   HDL 45 11/26/2016   LDLCALC 85 11/26/2016   TRIG 99 11/26/2016   CHOLHDL 3.3 11/26/2016    Significant Diagnostic Results  in last 30 days:  No results found.  Assessment/Plan Low Oxygen Sats with Significant weight loss Though Patient has been on Lasix and Metolazone but has lost significant Weight in past few months. Her Chest Xray in the Facility was negative for any Pulmonary edema or Any acute process. Will Get CT scan of Chest and Abdomen   Family/ staff Communication:   Labs/tests ordered:   Total time spent in this patient care encounter was 25_ minutes; greater than 50% of the visit spent counseling patient, reviewing records , Labs and coordinating care for problems addressed at this encounter.

## 2017-07-19 ENCOUNTER — Ambulatory Visit (HOSPITAL_COMMUNITY)
Admission: RE | Admit: 2017-07-19 | Discharge: 2017-07-19 | Disposition: A | Discharge status: Home / Self Care | Payer: Medicare Other | Source: Ambulatory Visit | Attending: Internal Medicine | Admitting: Internal Medicine

## 2017-07-19 DIAGNOSIS — N2889 Other specified disorders of kidney and ureter: Secondary | ICD-10-CM | POA: Insufficient documentation

## 2017-07-19 DIAGNOSIS — R918 Other nonspecific abnormal finding of lung field: Secondary | ICD-10-CM | POA: Diagnosis not present

## 2017-07-19 DIAGNOSIS — R634 Abnormal weight loss: Secondary | ICD-10-CM | POA: Diagnosis present

## 2017-07-19 DIAGNOSIS — I700 Atherosclerosis of aorta: Secondary | ICD-10-CM | POA: Diagnosis not present

## 2017-08-29 ENCOUNTER — Encounter: Payer: Self-pay | Admitting: Internal Medicine

## 2017-08-29 ENCOUNTER — Non-Acute Institutional Stay (SKILLED_NURSING_FACILITY): Payer: Medicare Other | Admitting: Internal Medicine

## 2017-08-29 DIAGNOSIS — I739 Peripheral vascular disease, unspecified: Secondary | ICD-10-CM

## 2017-08-29 DIAGNOSIS — R600 Localized edema: Secondary | ICD-10-CM | POA: Diagnosis not present

## 2017-08-29 DIAGNOSIS — E782 Mixed hyperlipidemia: Secondary | ICD-10-CM | POA: Diagnosis not present

## 2017-08-29 DIAGNOSIS — K219 Gastro-esophageal reflux disease without esophagitis: Secondary | ICD-10-CM

## 2017-08-29 DIAGNOSIS — I10 Essential (primary) hypertension: Secondary | ICD-10-CM | POA: Diagnosis not present

## 2017-08-29 DIAGNOSIS — E1151 Type 2 diabetes mellitus with diabetic peripheral angiopathy without gangrene: Secondary | ICD-10-CM

## 2017-08-29 NOTE — Progress Notes (Signed)
Location:   [facility] Room Number: 149/W Place of Service:  SNF [phone]) Provider:  Thurmond Butts, MD  Patient Care Team: Fayrene Helper, MD as PCP - General  Extended Emergency Contact Information Primary Emergency Contact: Valarie Merino Address: [place], [place] of [place] Phone: [phone] Work Phone: [phone] Relation: Daughter  Code Status:  Full Code Goals of care: Advanced Directive information Advanced Directives 08/29/2017  Does Patient Have a Medical Advance Directive? Yes  Type of Advance Directive (No Data)  Does patient want to make changes to medical advance directive? No - Patient declined  Copy of [place] in Chart? -     Chief Complaint  Patient presents with  . Medical Management of Chronic Issues    Patient being sen for Routine Visit    HPI:  Pt is a 84 y.o. female seen today for medical management of chronic diseases.   Patient has H/O Dementia, Diabetes Type 2, LE Edema, Hypothyroidism, Peripheral vascular disease and Chronic Venous stasis, Hyperlipidemia.  Patient is doing well in facility. Her weight has stabilized at 210 lbs. Though she has lost almost 35 lbs in past 6 months. She had CT Scan done of her abdomen and chest for significant weight loss. It showed some Soft tissue Prominence in GE junction ? Mass. Also some hyperdense lesions on Kidney. Most likely cysts. Patient continues to be asymptomatic. She did not have any Nursing issues and has been stable in facility.  Past Medical History:  Diagnosis Date  . Acute cystitis   . Dementia   . Dermatomycosis, unspecified   . DJD (degenerative joint disease)    Spine  . DM (diabetes mellitus) ([place])   . GERD (gastroesophageal reflux disease)   . History of pulmonary embolism   . History of supraventricular tachycardia   . Hyperlipidemia   . Hypertension   .  Hypothyroidism   . Long term (current) use of anticoagulants    coumadin  . Obesity   . Other malaise and fatigue   . PVD (peripheral vascular disease) ([place])    Past Surgical History:  Procedure Laterality Date  . ATRIAL ABLATION SURGERY  01/2003   Catheter, to tachic palpitations  . MASTECTOMY  2003   Left  . THYROIDECTOMY, PARTIAL  1962    Allergies  Allergen Reactions  . Contrast Media [Iodinated Diagnostic Agents] Other (See Comments)    Unknown-listed on MAR-patient is not aware of these allergies  . Latex Other (See Comments)    Unknown-listed on MAR-patient is not aware of these allergies    Outpatient Encounter Medications as of 08/29/2017  Medication Sig  . acetaminophen (TYLENOL) 325 MG tablet Take 650 mg by mouth 2 (two) times daily.  Marland Kitchen aspirin 81 MG tablet Take 81 mg by mouth daily.   Roseanne Kaufman [place]-Castor Oil (VENELEX) OINT Apply to sacrum and bilateral buttocks each shift and prn for prevention  . calcium-vitamin D (OSCAL WITH D) 500-200 MG-UNIT per tablet Take 1 tablet by mouth 2 (two) times daily.   . cholecalciferol (VITAMIN D) 1000 units tablet Take 1,000 Units by mouth 2 (two) times daily.   . cilostazol (PLETAL) 50 MG tablet Take 50 mg by mouth 2 (two) times daily.    Marland Kitchen donepezil (ARICEPT) 10 MG tablet Take 10 mg by mouth every evening.   . escitalopram (LEXAPRO) 5 MG tablet Take one tablets  by mouth at bedtime for mood  . furosemide (LASIX) 20 MG tablet Take 40 mg by mouth daily. Hold for systolic below BP below 716  . gabapentin (NEURONTIN) 300 MG capsule Take 300 mg by mouth 2 (two) times daily.    Marland Kitchen levothyroxine (SYNTHROID, LEVOTHROID) 50 MCG tablet Take 50 mcg by mouth daily.    Marland Kitchen lisinopril (PRINIVIL,ZESTRIL) 2.5 MG tablet Take 2.5 mg by mouth daily.  . memantine (NAMENDA) 10 MG tablet Take one tablet by mouth twice daily  . metFORMIN (GLUCOPHAGE) 500 MG tablet Take 500 mg by mouth 2 (two) times daily with a meal.    . metolazone (ZAROXOLYN) 2.5 MG  tablet Take 2.5 mg by mouth daily. Give on Mon., Wed., Fri.  . pravastatin (PRAVACHOL) 80 MG tablet Take 80 mg by mouth at bedtime.    . ranitidine (ZANTAC) 150 MG capsule Take 150 mg by mouth daily.   No facility-administered encounter medications on file as of 08/29/2017.      Review of Systems  Unable to perform ROS: Dementia    Immunization History  Administered Date(s) Administered  . Influenza Whole 10/11/2006  . Influenza-Unspecified 03/19/2009, 12/30/2016  . Pneumococcal Conjugate-13 12/30/2016  . Td 01/14/2003  . Tdap 12/15/2016   Pertinent  Health Maintenance Due  Topic Date Due  . URINE MICROALBUMIN  10/21/2017  . INFLUENZA VACCINE  10/26/2017  . HEMOGLOBIN A1C  12/03/2017  . FOOT EXAM  12/21/2017  . PNA vac Low Risk Adult (2 of 2 - PPSV23) 12/30/2017  . OPHTHALMOLOGY EXAM  01/17/2018  . DEXA SCAN  Completed   Fall Risk  12/15/2016  Falls in the past year? No   Functional Status Survey:    Vitals:   08/29/17 1000  BP: 133/70  Pulse: 80  Resp: 18  Temp: 98 F (36.7 C)  TempSrc: Oral  SpO2: 95%  Weight: 210 lb 6.4 oz (95.4 kg)  Height: 5\' 9"  (1.753 m)   Body mass index is 31.07 kg/m. Physical Exam  Constitutional: She appears well-developed and well-nourished.  HENT:  Head: Normocephalic.  Mouth/Throat: Oropharynx is clear and moist.  Eyes: Pupils are equal, round, and reactive to light.  Neck: Neck supple.  Cardiovascular: Normal rate and regular rhythm.  No murmur heard. Pulmonary/Chest: Effort normal and breath sounds normal. No stridor. No respiratory distress.  Abdominal: Soft. Bowel sounds are normal. She exhibits no distension. There is no tenderness. There is no guarding.  Musculoskeletal:  Mild Edema Bilateral  Neurological: She is alert.  Skin: Skin is warm and dry.  Psychiatric: She has a normal mood and affect. Her behavior is normal.    Labs reviewed: Recent Labs    06/06/17 0705 07/04/17 0714 07/16/17 0930  NA 142 140 137    K 4.2 4.0 4.0  CL 100* 98* 96*  CO2 30 30 29   GLUCOSE 150* 129* 123*  BUN 13 13 23*  CREATININE 0.97 1.12* 1.35*  CALCIUM 9.1 9.2 8.9   Recent Labs    08/31/16 1300 02/22/17 0708  AST 16 21  ALT 10* 12*  ALKPHOS 64 72  BILITOT 0.3 0.7  PROT 6.7 7.7  ALBUMIN 3.3* 3.9   Recent Labs    09/27/16 0500  02/22/17 0708  03/27/17 0400 06/02/17 0725 07/16/17 0930  WBC 5.0   < > 6.7   < > 4.6 4.1 4.0  NEUTROABS 2.7  --  4.5  --  2.7  --   --   HGB 11.7*   < >  11.4*   < > 11.0* 11.4* 12.2  HCT 39.8   < > 41.7   < > 40.9 40.7 41.8  MCV 82.4   < > 83.2   < > 82.0 82.1 80.2  PLT 259   < > 229   < > 247 211 195   < > = values in this interval not displayed.   Lab Results  Component Value Date   TSH 1.087 07/04/2017   Lab Results  Component Value Date   HGBA1C 7.3 (H) 06/02/2017   Lab Results  Component Value Date   CHOL 150 11/26/2016   HDL 45 11/26/2016   LDLCALC 85 11/26/2016   TRIG 99 11/26/2016   CHOLHDL 3.3 11/26/2016    Significant Diagnostic Results in last 30 days:  No results found.  Assessment/Plan  Weight Loss with Abnormal CT scan Her weight has stabilized now but will send her to GI to see if she needs further work up of Soft Tissue swelling in gastric Fundus area.on CT Scan bilateral lower Also will order Renal US for Cyst . Patient continues to be asymptomatic. Will d/w daughter Lower extremity edema she has done well on Metolazone and lasix Repeat BMP Diabetes mellitus with nephropathy and neuropathy Her blood sugars run less than 150 A1Cwas 7.3 in 03/19 Continue on same dose ofMetformin B12 normal in 03/19 Also on Neurontin Repeat A1C Essential hypertension Stable Continue on Lisinopril and Lasix Peripheral vascular disease Continue on aspirin andPletal  Hypothyroidism TSH normal in 03/19  Alzheimer's dementia Stable on Aricept and Namenda  Depression Will Continue Lexapro Hyperlipidemia Continue pravastatin. LDL 85  in 09/18 GERD Stable on  Zantac  DEXA scan was Tscore -.1.Marland Kitchen12 in 08/18  Family/ staff Communication:   Labs/tests ordered:

## 2017-09-04 ENCOUNTER — Ambulatory Visit (HOSPITAL_COMMUNITY)
Admit: 2017-09-04 | Discharge: 2017-09-04 | Disposition: A | Discharge status: Home / Self Care | Payer: Medicare Other | Attending: Internal Medicine | Admitting: Internal Medicine

## 2017-09-04 DIAGNOSIS — K7689 Other specified diseases of liver: Secondary | ICD-10-CM | POA: Insufficient documentation

## 2017-09-04 DIAGNOSIS — N289 Disorder of kidney and ureter, unspecified: Secondary | ICD-10-CM | POA: Diagnosis not present

## 2017-09-08 ENCOUNTER — Non-Acute Institutional Stay (SKILLED_NURSING_FACILITY): Payer: Medicare Other | Admitting: Internal Medicine

## 2017-09-08 ENCOUNTER — Encounter: Payer: Self-pay | Admitting: Internal Medicine

## 2017-09-08 DIAGNOSIS — I10 Essential (primary) hypertension: Secondary | ICD-10-CM | POA: Diagnosis not present

## 2017-09-08 DIAGNOSIS — E038 Other specified hypothyroidism: Secondary | ICD-10-CM

## 2017-09-08 DIAGNOSIS — Z5320 Procedure and treatment not carried out because of patient's decision for unspecified reasons: Secondary | ICD-10-CM

## 2017-09-08 DIAGNOSIS — F0280 Dementia in other diseases classified elsewhere without behavioral disturbance: Secondary | ICD-10-CM | POA: Diagnosis not present

## 2017-09-08 DIAGNOSIS — E1151 Type 2 diabetes mellitus with diabetic peripheral angiopathy without gangrene: Secondary | ICD-10-CM

## 2017-09-08 DIAGNOSIS — G309 Alzheimer's disease, unspecified: Secondary | ICD-10-CM | POA: Diagnosis not present

## 2017-09-08 DIAGNOSIS — R600 Localized edema: Secondary | ICD-10-CM

## 2017-09-08 NOTE — Progress Notes (Signed)
Location:   [facility] Room Number: 149/W Place of Service:  SNF [id number]) Provider:  Tera Helper, MD  Patient Care Team: Fayrene Helper, MD as PCP - General  Extended Emergency Contact Information Primary Emergency Contact: Valarie Merino Address: [place], [place] Phone: [phone] Work Phone: [phone] Relation: Daughter  Code Status:  DNR Goals of care: Advanced Directive information Advanced Directives 09/08/2017  Does Patient Have a Medical Advance Directive? Yes  Type of Advance Directive (No Data)  Does patient want to make changes to medical advance directive? No - Patient declined  Copy of [place] in Chart? -     Chief Complaint  Patient presents with  . Acute Visit    Patient being seen for Medication Management    HPI:  Pt is a 84 y.o. female seen today for an acute visit for concerns that she is refusing some of her medicines  Patient has H/O Dementia, Diabetes Type 2, LE Edema,Hypothyroidism, Peripheral vascular disease and Chronic Venous stasis, Hyperlipidemia.  She has been stable now for some time weight has stabilized at around 210 pounds- she had lost about 35 pounds previously over the past half year or so.  Scan of the abdomen did show soft tissue prominence in the GE junction questionable mass some hyperdense lesions on kidney was thought most likely cysts.  Follow-up ultrasound showed small hypodense lesions of both kidneys seen on the CT were not visualized by ultrasound-it did show simple and minimally complex cyst in the right liver grossly unchanged compared to prior CT finding  Regards to edema this appears stable she is on metolazone 3 times a week and also on Lasix  - for diabetes she is on Glucophage and this appears stable with blood sugars largely in the lower 100s  Hypertension also appears to be stable  on lisinopril and Lasix blood pressure today 7.  She also has peripheral vascular disease is on aspirin and Pletal.  In addition she is on Synthroid for hypothyroidism TSH has been within normal limits.  She is also on Aricept and Namenda with a history of dementia as well as Lexapro for depression  Speaking with her nurse today --He states that she took all of her medicines-but that you have to stay in the room and watch because of she is allowed to do this without monitoring she will not take all her medicines.  When I talked with her she vehemently denied not taking her medicine saying she takes everything--  she says she has no difficulty swallowing and feels she is doing just fine        Past Medical History:  Diagnosis Date  . Acute cystitis   . Dementia   . Dermatomycosis, unspecified   . DJD (degenerative joint disease)    Spine  . DM (diabetes mellitus) ([place])   . GERD (gastroesophageal reflux disease)   . History of pulmonary embolism   . History of supraventricular tachycardia   . Hyperlipidemia   . Hypertension   . Hypothyroidism   . Long term (current) use of anticoagulants    coumadin  . Obesity   . Other malaise and fatigue   . PVD (peripheral vascular disease) ([place])    Past Surgical History:  Procedure Laterality Date  . ATRIAL ABLATION SURGERY  01/2003   Catheter, to tachic palpitations  . MASTECTOMY  2003  Left  . THYROIDECTOMY, PARTIAL  1962    Allergies  Allergen Reactions  . Contrast Media [Iodinated Diagnostic Agents] Other (See Comments)    Unknown-listed on MAR-patient is not aware of these allergies  . Latex Other (See Comments)    Unknown-listed on MAR-patient is not aware of these allergies    Outpatient Encounter Medications as of 09/08/2017  Medication Sig  . acetaminophen (TYLENOL) 325 MG tablet Take 650 mg by mouth 2 (two) times daily.  Marland Kitchen aspirin 81 MG tablet Take 81 mg by mouth daily.   Roseanne Kaufman [place]-Castor Oil (VENELEX) OINT  Apply to sacrum and bilateral buttocks each shift and prn for prevention  . cilostazol (PLETAL) 50 MG tablet Take 50 mg by mouth 2 (two) times daily.    Marland Kitchen donepezil (ARICEPT) 10 MG tablet Take 10 mg by mouth every evening.   . escitalopram (LEXAPRO) 5 MG tablet Take one tablets by mouth at bedtime for mood  . furosemide (LASIX) 20 MG tablet Take 40 mg by mouth daily. Hold for systolic below BP below 166  . gabapentin (NEURONTIN) 300 MG capsule Take 300 mg by mouth 2 (two) times daily.    Marland Kitchen levothyroxine (SYNTHROID, LEVOTHROID) 50 MCG tablet Take 50 mcg by mouth daily.    Marland Kitchen lisinopril (PRINIVIL,ZESTRIL) 2.5 MG tablet Take 2.5 mg by mouth daily.  . memantine (NAMENDA) 10 MG tablet Take one tablet by mouth twice daily  . metFORMIN (GLUCOPHAGE) 500 MG tablet Take 500 mg by mouth 2 (two) times daily with a meal.    . metolazone (ZAROXOLYN) 2.5 MG tablet Take 2.5 mg by mouth daily. Give on Mon., Wed., Fri.  . pravastatin (PRAVACHOL) 80 MG tablet Take 80 mg by mouth at bedtime.    . ranitidine (ZANTAC) 150 MG capsule Take 150 mg by mouth daily.  . [DISCONTINUED] calcium-vitamin D (OSCAL WITH D) 500-200 MG-UNIT per tablet Take 1 tablet by mouth 2 (two) times daily.   . [DISCONTINUED] cholecalciferol (VITAMIN D) 1000 units tablet Take 1,000 Units by mouth 2 (two) times daily.    No facility-administered encounter medications on file as of 09/08/2017.      Review of Systems   This is limited secondary to dementia but she pretty much says  no to any suggestion of pain shortness of breath musculoskeletal discomfort-at times will complain of leg pain but is not complaining of that today.  Continues to be her jovial talkative self   Immunization History  Administered Date(s) Administered  . Influenza Whole 10/11/2006  . Influenza-Unspecified 03/19/2009, 12/30/2016  . Pneumococcal Conjugate-13 12/30/2016  . Td 01/14/2003  . Tdap 12/15/2016   Pertinent  Health Maintenance Due  Topic Date Due  .  URINE MICROALBUMIN  10/21/2017  . INFLUENZA VACCINE  10/26/2017  . HEMOGLOBIN A1C  12/03/2017  . FOOT EXAM  12/21/2017  . PNA vac Low Risk Adult (2 of 2 - PPSV23) 12/30/2017  . OPHTHALMOLOGY EXAM  01/17/2018  . DEXA SCAN  Completed   Fall Risk  12/15/2016  Falls in the past year? No   Functional Status Survey:    Temperature is 97.1 pulse 78 respirations 20 blood pressure 138/67 weight is relatively stabilized now 209 pounds  Physical Exam   In general this is a well-developed pleasant elderly female in no distress sitting comfortably in her wheelchair.  Her skin is warm and dry.  Eyes visual acuity appears intact sclera and conjunctive are clear.  Oropharynx is clear mucous membranes moist.  Chest is clear  to auscultation there is no labored breathing.  Heart is regular rate and rhythm without murmur gallop or rub she has mild lower extremity edema if anything improved from previous exams.  Abdomen is soft somewhat obese nontender with positive bowel sounds.  Musculoskeletal moves all extremities x4 largely ambulates in a wheelchair strength appears to be intact all 4 extremities  Neurologic is grossly intact her speech is clear no lateralizing findings.  Psych she is oriented to self pleasant appropriate quite talkative and jovial mood which is her usual self  Labs reviewed: Recent Labs    06/06/17 0705 07/04/17 0714 07/16/17 0930  NA 142 140 137  K 4.2 4.0 4.0  CL 100* 98* 96*  CO2 30 30 29   GLUCOSE 150* 129* 123*  BUN 13 13 23*  CREATININE 0.97 1.12* 1.35*  CALCIUM 9.1 9.2 8.9   Recent Labs    02/22/17 0708  AST 21  ALT 12*  ALKPHOS 72  BILITOT 0.7  PROT 7.7  ALBUMIN 3.9   Recent Labs    09/27/16 0500  02/22/17 0708  03/27/17 0400 06/02/17 0725 07/16/17 0930  WBC 5.0   < > 6.7   < > 4.6 4.1 4.0  NEUTROABS 2.7  --  4.5  --  2.7  --   --   HGB 11.7*   < > 11.4*   < > 11.0* 11.4* 12.2  HCT 39.8   < > 41.7   < > 40.9 40.7 41.8  MCV 82.4   < >  83.2   < > 82.0 82.1 80.2  PLT 259   < > 229   < > 247 211 195   < > = values in this interval not displayed.   Lab Results  Component Value Date   TSH 1.087 07/04/2017   Lab Results  Component Value Date   HGBA1C 7.3 (H) 06/02/2017   Lab Results  Component Value Date   CHOL 150 11/26/2016   HDL 45 11/26/2016   LDLCALC 85 11/26/2016   TRIG 99 11/26/2016   CHOLHDL 3.3 11/26/2016    Significant Diagnostic Results in last 30 days:  US Renal  Result Date: 09/04/2017 CLINICAL DATA:  Bilateral hyperdense renal lesions on CT. EXAM: RENAL / URINARY TRACT ULTRASOUND COMPLETE COMPARISON:  CT abdomen pelvis dated July 19, 2017. FINDINGS: Right Kidney: Length: 10.7 cm. Echogenicity within normal limits. No mass or hydronephrosis visualized. Left Kidney: Length: 10.5 cm. Echogenicity within normal limits. No mass or hydronephrosis visualized. There is a 1.7 cm simple appearing cyst in the upper pole of the left kidney. Bladder: Appears normal for degree of bladder distention. Two anechoic cysts within the right liver, the larger of which measures 10.5 x 6.1 x 10.3 cm. The smaller cyst contains a thin internal septation. These are grossly unchanged when compared to prior CT. IMPRESSION: 1. The small hyperdense lesions in both kidneys seen on prior CT are not visualized by ultrasound. 2. Simple and minimally complex cysts in the right liver, grossly unchanged when compared to prior CT. Electronically Signed   By: Titus Dubin M.D.   On: 09/04/2017 17:22    Assessment/Plan  #1 history of medication refusal- patient denies this but she is a poor historian-nursing today states that she does take her meds but has to be monitored.  Clinically she appears to be doing well and stable but will update lab work including a [place] representative and TSH-if she has missed frequent doses of Synthroid I suspect TSH will be  affected.  Also if there is irregular use of diuretics and electrolytes.  I do note  her creatinine was slightly above her baseline recently at 1.35 and will update this.  2.  History of weight loss this appears to be stabilized at around 210 pounds again renal ultrasound did show cysts- has had soft tissue swelling in gastric fundus area on CT scan--this has been assessed by Dr. Lyndel Safe who will discuss it with her daughter  #3-diabetes type 2 this appears stable with blood sugars largely in the lower 100s  4- history of neuropathy she is on Neurontin today.  5 history of hypertension this appears stable on lisinopril and Lasix again sure how often she takes these medications but labs will give [place] some insight.  6 history of peripheral vascular disease--is not really complaining of pain today she is on aspirin and Pletal  e #7 hypothyroidism again will update a TSH this has been normal in the past she is on Synthroid.  8.-Dementia this appears stable on Aricept and Namenda pleasant jovial somewhat confused at times but baseline personality 9- Depression she is on Lexapro she appears to be in really good spirits today without evidence of depression   #10 hyperlipidemia she is on a statin LDL was 85  Again will update a CBC- BMP-as well as TSH were updated values.  [id number] note greater than 25 minutes spent assessing patient- reviewing her chart and labs- assessing her status with nursing staff- coordinating and formulating a plan of care- of note greater than 50% of time coordinating a plan of care

## 2017-09-11 ENCOUNTER — Encounter (HOSPITAL_COMMUNITY)
Admission: RE | Admit: 2017-09-11 | Discharge: 2017-09-11 | Disposition: A | Discharge status: Home / Self Care | Payer: Medicare Other | Source: Skilled Nursing Facility | Attending: Internal Medicine | Admitting: Internal Medicine

## 2017-09-11 DIAGNOSIS — F329 Major depressive disorder, single episode, unspecified: Secondary | ICD-10-CM | POA: Insufficient documentation

## 2017-09-11 DIAGNOSIS — I10 Essential (primary) hypertension: Secondary | ICD-10-CM | POA: Insufficient documentation

## 2017-09-11 DIAGNOSIS — K219 Gastro-esophageal reflux disease without esophagitis: Secondary | ICD-10-CM | POA: Diagnosis not present

## 2017-09-11 DIAGNOSIS — G4700 Insomnia, unspecified: Secondary | ICD-10-CM | POA: Diagnosis present

## 2017-09-11 DIAGNOSIS — R609 Edema, unspecified: Secondary | ICD-10-CM | POA: Diagnosis not present

## 2017-09-11 DIAGNOSIS — I739 Peripheral vascular disease, unspecified: Secondary | ICD-10-CM | POA: Insufficient documentation

## 2017-09-11 LAB — TSH: TSH: 0.921 u[IU]/mL (ref 0.350–4.500)

## 2017-09-11 LAB — BASIC METABOLIC PANEL
ANION GAP: 6 (ref 5–15)
BUN: 12 mg/dL (ref 6–20)
CHLORIDE: 103 mmol/L (ref 101–111)
CO2: 34 mmol/L — AB (ref 22–32)
Calcium: 9 mg/dL (ref 8.9–10.3)
Creatinine, Ser: 1.13 mg/dL — ABNORMAL HIGH (ref 0.44–1.00)
GFR calc Af Amer: 47 mL/min — ABNORMAL LOW (ref >60)
GFR, EST NON AFRICAN AMERICAN: 41 mL/min — AB (ref >60)
Glucose, Bld: 124 mg/dL — ABNORMAL HIGH (ref 65–99)
Potassium: 4 mmol/L (ref 3.5–5.1)
Sodium: 143 mmol/L (ref 135–145)

## 2017-09-11 LAB — CBC WITH DIFFERENTIAL/PLATELET
Basophils Absolute: 0 10*3/uL (ref 0.0–0.1)
Basophils Relative: 0 %
EOS ABS: 0.1 10*3/uL (ref 0.0–0.7)
Eosinophils Relative: 3 %
HEMATOCRIT: 42.1 % (ref 36.0–46.0)
HEMOGLOBIN: 12.6 g/dL (ref 12.0–15.0)
LYMPHS ABS: 1.6 10*3/uL (ref 0.7–4.0)
Lymphocytes Relative: 33 %
MCH: 24.7 pg — ABNORMAL LOW (ref 26.0–34.0)
MCHC: 29.9 g/dL — ABNORMAL LOW (ref 30.0–36.0)
MCV: 82.5 fL (ref 78.0–100.0)
Monocytes Absolute: 0.5 10*3/uL (ref 0.1–1.0)
Monocytes Relative: 11 %
NEUTROS PCT: 53 %
Neutro Abs: 2.5 10*3/uL (ref 1.7–7.7)
Platelets: 183 10*3/uL (ref 150–400)
RBC: 5.1 MIL/uL (ref 3.87–5.11)
RDW: 15.6 % — ABNORMAL HIGH (ref 11.5–15.5)
WBC: 4.7 10*3/uL (ref 4.0–10.5)

## 2017-09-12 ENCOUNTER — Encounter (HOSPITAL_COMMUNITY)
Admission: RE | Admit: 2017-09-12 | Discharge: 2017-09-12 | Disposition: A | Discharge status: Home / Self Care | Payer: Medicare Other | Source: Skilled Nursing Facility | Attending: Internal Medicine | Admitting: Internal Medicine

## 2017-09-12 DIAGNOSIS — I739 Peripheral vascular disease, unspecified: Secondary | ICD-10-CM | POA: Diagnosis not present

## 2017-09-12 DIAGNOSIS — K219 Gastro-esophageal reflux disease without esophagitis: Secondary | ICD-10-CM | POA: Diagnosis not present

## 2017-09-12 DIAGNOSIS — E119 Type 2 diabetes mellitus without complications: Secondary | ICD-10-CM | POA: Diagnosis present

## 2017-09-12 DIAGNOSIS — G4700 Insomnia, unspecified: Secondary | ICD-10-CM | POA: Diagnosis present

## 2017-09-12 DIAGNOSIS — R7309 Other abnormal glucose: Secondary | ICD-10-CM | POA: Diagnosis not present

## 2017-09-12 DIAGNOSIS — F329 Major depressive disorder, single episode, unspecified: Secondary | ICD-10-CM | POA: Diagnosis not present

## 2017-09-12 DIAGNOSIS — G629 Polyneuropathy, unspecified: Secondary | ICD-10-CM | POA: Diagnosis not present

## 2017-09-12 DIAGNOSIS — I10 Essential (primary) hypertension: Secondary | ICD-10-CM | POA: Insufficient documentation

## 2017-09-12 LAB — HEMOGLOBIN A1C
HEMOGLOBIN A1C: 7.3 % — AB (ref 4.8–5.6)
Mean Plasma Glucose: 162.81 mg/dL

## 2017-09-13 ENCOUNTER — Ambulatory Visit (INDEPENDENT_AMBULATORY_CARE_PROVIDER_SITE_OTHER): Payer: Self-pay | Admitting: Internal Medicine

## 2017-09-13 ENCOUNTER — Telehealth (INDEPENDENT_AMBULATORY_CARE_PROVIDER_SITE_OTHER): Payer: Self-pay | Admitting: Internal Medicine

## 2017-09-13 ENCOUNTER — Encounter (INDEPENDENT_AMBULATORY_CARE_PROVIDER_SITE_OTHER): Payer: Self-pay | Admitting: Internal Medicine

## 2017-09-13 VITALS — BP 110/70 | HR 66 | Resp 18 | Ht 70.0 in | Wt 206.7 lb

## 2017-09-13 DIAGNOSIS — R933 Abnormal findings on diagnostic imaging of other parts of digestive tract: Secondary | ICD-10-CM

## 2017-09-13 DIAGNOSIS — R634 Abnormal weight loss: Secondary | ICD-10-CM

## 2017-09-13 NOTE — Telephone Encounter (Signed)
Will need an EGD. Patient has dementia.

## 2017-09-13 NOTE — Patient Instructions (Addendum)
We will be in contact.

## 2017-09-13 NOTE — Progress Notes (Signed)
Subjective:    Patient ID: Brandi Rice, female    DOB: 1922/01/06, 84 y.o.   MRN: [id number]  HPI Referred by Dr. Lyndel Safe for abnormal CT. Underwent a CT in April for wt loss, N and V x 2 months.  IMPRESSION: 1. Prominence of the soft tissues at the fundus of the stomach at the gastroesophageal junction. The appearance is nonspecific but the possibility of a mass in that region should be considered. 2. Small hyperdense lesions on both kidneys, most likely representing hyperdense cysts. Renal ultrasound is recommended for confirmation. 3. Stable pulmonary nodules, unchanged since 2006. 4.  Aortic Atherosclerosis (ICD10-I70.0). Resident of [facility] for several years. . She says the food is not good.   She denies any abdominal pain.  She says her BMs are normal.  Her weight today 206. In March weight 221. She says her stools are black. Hemoglobin is stable.  No dysphagia.   Hx of Left breast cancer in the early 2003 Hx of dementia, hypertension, major depressive disorder, hypothyroid, PVD, high cholesterol, GERD, PE, Peripheral neuropathy, TN, DM type 2 anemia.    CBC    Component Value Date/Time   WBC 4.7 09/11/2017 0719   RBC 5.10 09/11/2017 0719   HGB 12.6 09/11/2017 0719   HCT 42.1 09/11/2017 0719   PLT 183 09/11/2017 0719   MCV 82.5 09/11/2017 0719   MCH 24.7 (L) 09/11/2017 0719   MCHC 29.9 (L) 09/11/2017 0719   RDW 15.6 (H) 09/11/2017 0719   LYMPHSABS 1.6 09/11/2017 0719   MONOABS 0.5 09/11/2017 0719   EOSABS 0.1 09/11/2017 0719   BASOSABS 0.0 09/11/2017 0719    Review of Systems Past Medical History:  Diagnosis Date  . Acute cystitis   . Dementia   . Dermatomycosis, unspecified   . DJD (degenerative joint disease)    Spine  . DM (diabetes mellitus) ([place])   . GERD (gastroesophageal reflux disease)   . History of pulmonary embolism   . History of supraventricular tachycardia   . Hyperlipidemia   . Hypertension   . Hypothyroidism   . Long term  (current) use of anticoagulants    coumadin  . Obesity   . Other malaise and fatigue   . PVD (peripheral vascular disease) ([place])     Past Surgical History:  Procedure Laterality Date  . ATRIAL ABLATION SURGERY  01/2003   Catheter, to tachic palpitations  . MASTECTOMY  2003   Left  . THYROIDECTOMY, PARTIAL  1962    Allergies  Allergen Reactions  . Contrast Media [Iodinated Diagnostic Agents] Other (See Comments)    Unknown-listed on MAR-patient is not aware of these allergies  . Latex Other (See Comments)    Unknown-listed on MAR-patient is not aware of these allergies    Current Outpatient Medications on File Prior to Visit  Medication Sig Dispense Refill  . acetaminophen (TYLENOL) 325 MG tablet Take 650 mg by mouth 2 (two) times daily.    Marland Kitchen aspirin 81 MG tablet Take 81 mg by mouth daily.     Roseanne Kaufman [place]-Castor Oil (VENELEX) OINT Apply to sacrum and bilateral buttocks each shift and prn for prevention    . Calcium Carb-Cholecalciferol (CALCIUM 500+D) 500-200 MG-UNIT TABS Take by mouth daily.    . Cholecalciferol (VITAMIN D PO) Take 1,000 Units by mouth 2 (two) times daily.    . cilostazol (PLETAL) 50 MG tablet Take 50 mg by mouth 2 (two) times daily.      Marland Kitchen donepezil (ARICEPT) 10  MG tablet Take 10 mg by mouth every evening.     . escitalopram (LEXAPRO) 5 MG tablet Take one tablets by mouth at bedtime for mood    . furosemide (LASIX) 20 MG tablet Take 40 mg by mouth daily. Hold for systolic below BP below 334    . gabapentin (NEURONTIN) 300 MG capsule Take 300 mg by mouth 2 (two) times daily.      Marland Kitchen levothyroxine (SYNTHROID, LEVOTHROID) 50 MCG tablet Take 50 mcg by mouth daily.      Marland Kitchen lisinopril (PRINIVIL,ZESTRIL) 2.5 MG tablet Take 2.5 mg by mouth daily.    . memantine (NAMENDA) 10 MG tablet Take one tablet by mouth twice daily    . metFORMIN (GLUCOPHAGE) 500 MG tablet Take 500 mg by mouth 2 (two) times daily with a meal.      . metolazone (ZAROXOLYN) 2.5 MG tablet Take 2.5  mg by mouth daily. Give on Mon., Wed., Fri.    . pravastatin (PRAVACHOL) 80 MG tablet Take 80 mg by mouth at bedtime.      . ranitidine (ZANTAC) 150 MG capsule Take 150 mg by mouth daily.     No current facility-administered medications on file prior to visit.         Objective:   Physical Exam Blood pressure 110/70, pulse 66, resp. rate 18, height 5\' 10"  (1.778 m), weight 206 lb 11.2 oz (93.8 kg). Alert and oriented. Skin warm and dry. Oral mucosa is moist.   . Sclera anicteric, conjunctivae is pink. Thyroid not enlarged. No cervical lymphadenopathy. Lungs clear. Heart regular rate and rhythm.  Abdomen is soft. Bowel sounds are positive. No hepatomegaly. No abdominal masses felt. No tenderness.  No edema to lower extremities.  Stool brown and guaiac negative.          Assessment & Plan:  Abnormal CT. ? Mass. Needs EGD. To rule out mass. Melena: Stool brown and guaiac negative.  Weight loss.

## 2017-09-14 ENCOUNTER — Encounter (INDEPENDENT_AMBULATORY_CARE_PROVIDER_SITE_OTHER): Payer: Self-pay | Admitting: *Deleted

## 2017-09-14 DIAGNOSIS — R933 Abnormal findings on diagnostic imaging of other parts of digestive tract: Secondary | ICD-10-CM | POA: Insufficient documentation

## 2017-09-14 NOTE — Telephone Encounter (Signed)
EGD sch'd 09/25/17 at 250 (150), [facility] aware, instructions faxed to her nurse @ [phone]

## 2017-09-25 ENCOUNTER — Encounter (INDEPENDENT_AMBULATORY_CARE_PROVIDER_SITE_OTHER): Payer: Self-pay | Admitting: *Deleted

## 2017-09-27 ENCOUNTER — Inpatient Hospital Stay
Admission: RE | Admit: 2017-09-27 | Discharge: 2018-09-26 | Disposition: E | Payer: Medicare Other | Source: Ambulatory Visit | Attending: Internal Medicine | Admitting: Internal Medicine

## 2017-09-27 ENCOUNTER — Ambulatory Visit (HOSPITAL_COMMUNITY)
Admission: RE | Admit: 2017-09-27 | Discharge: 2017-09-27 | Disposition: A | Discharge status: Home / Self Care | Payer: Medicare Other | Source: Ambulatory Visit | Attending: Internal Medicine | Admitting: Internal Medicine

## 2017-09-27 ENCOUNTER — Encounter (HOSPITAL_COMMUNITY)
Admission: RE | Disposition: A | Discharge status: Home / Self Care | Payer: Self-pay | Source: Ambulatory Visit | Attending: Internal Medicine

## 2017-09-27 ENCOUNTER — Encounter (HOSPITAL_COMMUNITY): Payer: Self-pay | Admitting: *Deleted

## 2017-09-27 ENCOUNTER — Other Ambulatory Visit: Payer: Self-pay

## 2017-09-27 DIAGNOSIS — Z87891 Personal history of nicotine dependence: Secondary | ICD-10-CM | POA: Insufficient documentation

## 2017-09-27 DIAGNOSIS — F0390 Unspecified dementia without behavioral disturbance: Secondary | ICD-10-CM | POA: Insufficient documentation

## 2017-09-27 DIAGNOSIS — Z91040 Latex allergy status: Secondary | ICD-10-CM | POA: Diagnosis not present

## 2017-09-27 DIAGNOSIS — R05 Cough: Principal | ICD-10-CM

## 2017-09-27 DIAGNOSIS — E785 Hyperlipidemia, unspecified: Secondary | ICD-10-CM | POA: Diagnosis not present

## 2017-09-27 DIAGNOSIS — R933 Abnormal findings on diagnostic imaging of other parts of digestive tract: Secondary | ICD-10-CM | POA: Insufficient documentation

## 2017-09-27 DIAGNOSIS — K210 Gastro-esophageal reflux disease with esophagitis: Secondary | ICD-10-CM | POA: Insufficient documentation

## 2017-09-27 DIAGNOSIS — Z91041 Radiographic dye allergy status: Secondary | ICD-10-CM | POA: Diagnosis not present

## 2017-09-27 DIAGNOSIS — E039 Hypothyroidism, unspecified: Secondary | ICD-10-CM | POA: Diagnosis not present

## 2017-09-27 DIAGNOSIS — Z7984 Long term (current) use of oral hypoglycemic drugs: Secondary | ICD-10-CM | POA: Insufficient documentation

## 2017-09-27 DIAGNOSIS — E1151 Type 2 diabetes mellitus with diabetic peripheral angiopathy without gangrene: Secondary | ICD-10-CM | POA: Diagnosis not present

## 2017-09-27 DIAGNOSIS — Z79899 Other long term (current) drug therapy: Secondary | ICD-10-CM | POA: Diagnosis not present

## 2017-09-27 DIAGNOSIS — M1990 Unspecified osteoarthritis, unspecified site: Secondary | ICD-10-CM | POA: Insufficient documentation

## 2017-09-27 DIAGNOSIS — I10 Essential (primary) hypertension: Secondary | ICD-10-CM | POA: Insufficient documentation

## 2017-09-27 DIAGNOSIS — Z7901 Long term (current) use of anticoagulants: Secondary | ICD-10-CM | POA: Insufficient documentation

## 2017-09-27 DIAGNOSIS — Z8249 Family history of ischemic heart disease and other diseases of the circulatory system: Secondary | ICD-10-CM | POA: Diagnosis not present

## 2017-09-27 DIAGNOSIS — K7689 Other specified diseases of liver: Secondary | ICD-10-CM | POA: Diagnosis not present

## 2017-09-27 DIAGNOSIS — R634 Abnormal weight loss: Secondary | ICD-10-CM | POA: Diagnosis present

## 2017-09-27 DIAGNOSIS — Z86711 Personal history of pulmonary embolism: Secondary | ICD-10-CM | POA: Diagnosis not present

## 2017-09-27 DIAGNOSIS — K449 Diaphragmatic hernia without obstruction or gangrene: Secondary | ICD-10-CM | POA: Insufficient documentation

## 2017-09-27 DIAGNOSIS — R059 Cough, unspecified: Principal | ICD-10-CM

## 2017-09-27 DIAGNOSIS — Z7982 Long term (current) use of aspirin: Secondary | ICD-10-CM | POA: Insufficient documentation

## 2017-09-27 DIAGNOSIS — K228 Other specified diseases of esophagus: Secondary | ICD-10-CM

## 2017-09-27 HISTORY — PX: ESOPHAGOGASTRODUODENOSCOPY: SHX5428

## 2017-09-27 LAB — GLUCOSE, CAPILLARY: GLUCOSE-CAPILLARY: 98 mg/dL (ref 70–99)

## 2017-09-27 SURGERY — EGD (ESOPHAGOGASTRODUODENOSCOPY)
Anesthesia: Moderate Sedation

## 2017-09-27 MED ORDER — MIDAZOLAM HCL 5 MG/5ML IJ SOLN
INTRAMUSCULAR | Status: DC | PRN
  Administered 2017-09-27 (×2): 1 mg via INTRAVENOUS

## 2017-09-27 MED ORDER — MIDAZOLAM HCL 5 MG/5ML IJ SOLN
INTRAMUSCULAR | Status: AC
  Filled 2017-09-27: qty 10

## 2017-09-27 MED ORDER — SODIUM CHLORIDE 0.9 % IV SOLN
INTRAVENOUS | Status: DC
  Administered 2017-09-27: 1000 mL via INTRAVENOUS

## 2017-09-27 MED ORDER — MEPERIDINE HCL 50 MG/ML IJ SOLN
INTRAMUSCULAR | Status: DC | PRN
  Administered 2017-09-27: 20 mg via INTRAVENOUS

## 2017-09-27 MED ORDER — LIDOCAINE VISCOUS HCL 2 % MT SOLN
OROMUCOSAL | Status: AC
  Filled 2017-09-27: qty 15

## 2017-09-27 MED ORDER — STERILE WATER FOR IRRIGATION IR SOLN
Status: DC | PRN
  Administered 2017-09-27: 2.5 mL

## 2017-09-27 MED ORDER — MEPERIDINE HCL 50 MG/ML IJ SOLN
INTRAMUSCULAR | Status: AC
  Filled 2017-09-27: qty 1

## 2017-09-27 MED ORDER — LIDOCAINE VISCOUS HCL 2 % MT SOLN
OROMUCOSAL | Status: DC | PRN
  Administered 2017-09-27: 1 via OROMUCOSAL

## 2017-09-27 NOTE — Discharge Instructions (Signed)
Increase ranitidine to 150 mg by mouth twice daily. Resume other medications as before including aspirin. Resume usual diet. Follow-up with Dr. Moshe Cipro as planned   Upper Endoscopy, Care After Refer to this sheet in the next few weeks. These instructions provide you with information about caring for yourself after your procedure. Your health care provider may also give you more specific instructions. Your treatment has been planned according to current medical practices, but problems sometimes occur. Call your health care provider if you have any problems or questions after your procedure. What can I expect after the procedure? After the procedure, it is common to have:  A sore throat.  Bloating.  Nausea.  Follow these instructions at home:  Follow instructions from your health care provider about what to eat or drink after your procedure.  Return to your normal activities as told by your health care provider. Ask your health care provider what activities are safe for you.  Take over-the-counter and prescription medicines only as told by your health care provider.  Do not drive for 24 hours if you received a sedative.  Keep all follow-up visits as told by your health care provider. This is important. Contact a health care provider if:  You have a sore throat that lasts longer than one day.  You have trouble swallowing. Get help right away if:  You have a fever.  You vomit blood or your vomit looks like coffee grounds.  You have bloody, black, or tarry stools.  You have a severe sore throat or you cannot swallow.  You have difficulty breathing.  You have severe pain in your chest or belly. This information is not intended to replace advice given to you by your health care provider. Make sure you discuss any questions you have with your health care provider. Document Released: 09/13/2011 Document Revised: 08/20/2015 Document Reviewed: 12/25/2014 Elsevier Interactive  Patient Education  [employer].

## 2017-09-27 NOTE — H&P (Addendum)
Brandi Rice is an 84 y.o. female.   Chief Complaint: Patient is here for EGD.  HPI: Patient is a 84 year old female who was recently evaluated for 40 pound unintentional weight loss.  She had chest and abdominal pelvic CT.  She was noted to have thickening to gastric mucosa and fundal region.  CT also revealed hepatic cysts including one large cyst.  Therefore further evaluation was advised.  She states her appetite is improved.  She denies nausea vomiting dysphagia abdominal pain melena or rectal bleeding.  She is on ranitidine for chronic GERD. Aspirin is on hold.   Past Medical History:  Diagnosis Date  . Acute cystitis   . Dementia   . Dermatomycosis, unspecified   . DJD (degenerative joint disease)    Spine  . DM (diabetes mellitus) ([place])   . GERD (gastroesophageal reflux disease)   . History of pulmonary embolism   . History of supraventricular tachycardia   . Hyperlipidemia   . Hypertension   . Hypothyroidism   . Long term (current) use of anticoagulants    coumadin  . Obesity   . Other malaise and fatigue   . PVD (peripheral vascular disease) ([place])     Past Surgical History:  Procedure Laterality Date  . ATRIAL ABLATION SURGERY  01/2003   Catheter, to tachic palpitations  . MASTECTOMY  2003   Left  . THYROIDECTOMY, PARTIAL  1962    Family History  Problem Relation Age of Onset  . Coronary artery disease Sister   . Coronary artery disease Sister   . Coronary artery disease Sister   . Heart attack Sister   . Alcohol abuse Sister    Social History:  reports that she has quit smoking. She has never used smokeless tobacco. She reports that she does not drink alcohol or use drugs.  Allergies:  Allergies  Allergen Reactions  . Contrast Media [Iodinated Diagnostic Agents] Other (See Comments)    Unknown-listed on MAR-patient is not aware of these allergies  . Latex Other (See Comments)    Unknown-listed on MAR-patient is not aware of these allergies     Medications Prior to Admission  Medication Sig Dispense Refill  . acetaminophen (TYLENOL) 325 MG tablet Take 650 mg by mouth 2 (two) times daily.    Marland Kitchen aspirin 81 MG tablet Take 81 mg by mouth daily.     Roseanne Kaufman [place]-Castor Oil (VENELEX) OINT Apply 1 application topically See admin instructions. Apply to sacrum and bilateral buttocks each shift and prn for prevention     . cilostazol (PLETAL) 50 MG tablet Take 50 mg by mouth 2 (two) times daily.      Marland Kitchen donepezil (ARICEPT) 10 MG tablet Take 10 mg by mouth every evening.     . escitalopram (LEXAPRO) 5 MG tablet Take 5 mg by mouth at bedtime.     . furosemide (LASIX) 40 MG tablet Take 40 mg by mouth daily. Hold for systolic below BP below 409    . gabapentin (NEURONTIN) 300 MG capsule Take 300 mg by mouth 2 (two) times daily.      Marland Kitchen levothyroxine (SYNTHROID, LEVOTHROID) 50 MCG tablet Take 50 mcg by mouth daily.      Marland Kitchen lisinopril (PRINIVIL,ZESTRIL) 2.5 MG tablet Take 2.5 mg by mouth daily.    . memantine (NAMENDA) 10 MG tablet Take 10 mg by mouth 2 (two) times daily.     . metFORMIN (GLUCOPHAGE) 500 MG tablet Take 500 mg by mouth 2 (two) times daily  with a meal.      . metolazone (ZAROXOLYN) 2.5 MG tablet Take 2.5 mg by mouth every Monday, Wednesday, and Friday.     . pravastatin (PRAVACHOL) 80 MG tablet Take 80 mg by mouth at bedtime.      . ranitidine (ZANTAC) 150 MG capsule Take 150 mg by mouth daily.      Results for orders placed or performed during the hospital encounter of 10/09/2017 (from the past 48 hour(s))  Glucose, capillary     Status: None   Collection Time: 10/10/2017  1:34 PM  Result Value Ref Range   Glucose-Capillary 98 70 - 99 mg/dL   No results found.  ROS  Blood pressure (!) 141/95, pulse 78, temperature 98.6 F (37 C), temperature source Oral, resp. rate 16, SpO2 95 %. Physical Exam  Constitutional: She appears well-developed and well-nourished.  HENT:  Mouth/Throat: Oropharynx is clear and moist.  Eyes:  Conjunctivae are normal. No scleral icterus.  Neck: No thyromegaly present.  Cardiovascular: Normal rate, regular rhythm and normal heart sounds.  No murmur heard. Respiratory: Effort normal and breath sounds normal.  GI: Soft. She exhibits no distension and no mass. There is no tenderness.  Musculoskeletal: She exhibits no edema.  Lymphadenopathy:    She has no cervical adenopathy.  Neurological: She is alert.  Skin: Skin is warm and dry.     Assessment/Plan Anorexia and weight loss. Fundal gastric wall abnormality on chest and abdominal pelvic CT.   Diagnostic EGD.   Hildred Laser, MD 10/01/2017, 1:52 PM

## 2017-09-27 NOTE — Op Note (Signed)
[facility] Patient Name: Brandi Rice Procedure Date: 10/21/2017 1:25 PM MRN: [id number] Date of Birth: 29-Jun-1921 Attending MD: Hildred Laser , MD CSN: [id number] Age: 84 Admit Type: Outpatient Procedure:                Upper GI endoscopy Indications:              Weight loss. Abnormal CT of the GI tract. Providers:                Hildred Laser, MD, Lurline Del, RN, Nelma Rothman,                            Technician Referring MD:             Norwood Levo. Simpson MD, MD Medicines:                Lidocaine spray, Meperidine 20 mg IV, Midazolam 2                            mg IV Complications:            No immediate complications. Estimated Blood Loss:     Estimated blood loss: none. Procedure:                Pre-Anesthesia Assessment:                           - Prior to the procedure, a History and Physical                            was performed, and patient medications and                            allergies were reviewed. The patient's tolerance of                            previous anesthesia was also reviewed. The risks                            and benefits of the procedure and the sedation                            options and risks were discussed with the patient.                            All questions were answered, and informed consent                            was obtained. Prior Anticoagulants: The patient                            last took aspirin 5 days prior to the procedure.                            ASA Grade Assessment: III - A patient with severe  systemic disease. After reviewing the risks and                            benefits, the patient was deemed in satisfactory                            condition to undergo the procedure.                           After obtaining informed consent, the endoscope was                            passed under direct vision. Throughout the                            procedure, the patient's  blood pressure, pulse, and                            oxygen saturations were monitored continuously. The                            [id number]-[id number] ([id number]) scope was introduced through the                            mouth, and advanced to the second part of duodenum.                            The upper GI endoscopy was accomplished without                            difficulty. The patient tolerated the procedure                            well. Scope In: 2:04:09 PM Scope Out: 2:09:48 PM Total Procedure Duration: 0 hours 5 minutes 39 seconds  Findings:      The proximal esophagus and mid esophagus were normal.      LA Grade A (one or more mucosal breaks less than 5 mm, not extending       between tops of 2 mucosal folds) esophagitis was found 41 to 42 cm from       the incisors.      The Z-line was irregular and was found 43 cm from the incisors.      A 3 cm hiatal hernia was present.      The entire examined stomach was normal.      The duodenal bulb and second portion of the duodenum were normal. Impression:               - Normal proximal esophagus and mid esophagus.                           - LA Grade A reflux esophagitis.                           - Z-line irregular, 43 cm from the incisors.                           -  3 cm hiatal hernia.                           - Normal stomach.                           - Normal duodenal bulb and second portion of the                            duodenum.                           - No specimens collected. Moderate Sedation:      Moderate (conscious) sedation was administered by the endoscopy nurse       and supervised by the endoscopist. The following parameters were       monitored: oxygen saturation, heart rate, blood pressure, CO2       capnography and response to care. Total physician intraservice time was       11 minutes. Recommendation:           - Patient has a contact number available for                            emergencies. The  signs and symptoms of potential                            delayed complications were discussed with the                            patient. Return to normal activities tomorrow.                            Written discharge instructions were provided to the                            patient.                           - Resume previous diet today.                           - Increase Ranitidine to 150 mg po bid.                           - Continue other medications including aspirin as                            before.                           - Return to primary care physician. Procedure Code(s):        --- Professional ---                           [id number], Esophagogastroduodenoscopy, flexible,  transoral; diagnostic, including collection of                            specimen(s) by brushing or washing, when performed                            (separate procedure)                           G0500, Moderate sedation services provided by the                            same physician or other qualified health care                            professional performing a gastrointestinal                            endoscopic service that sedation supports,                            requiring the presence of an independent trained                            observer to assist in the monitoring of the                            patient's level of consciousness and physiological                            status; initial 15 minutes of intra-service time;                            patient age 34 years or older (additional time may                            be reported with [phone], as appropriate) Diagnosis Code(s):        --- Professional ---                           K21.0, Gastro-esophageal reflux disease with                            esophagitis                           K22.8, Other specified diseases of esophagus                           K44.9, Diaphragmatic  hernia without obstruction or                            gangrene                           R63.4, Abnormal weight loss  R93.3, Abnormal findings on diagnostic imaging of                            other parts of digestive tract CPT copyright 2017 [employer]. All rights reserved. The codes documented in this report are preliminary and upon coder review may  be revised to meet current compliance requirements. Hildred Laser, MD Hildred Laser, MD 10/06/2017 2:22:42 PM This report has been signed electronically. Number of Addenda: 0

## 2017-10-04 ENCOUNTER — Encounter (HOSPITAL_COMMUNITY): Payer: Self-pay | Admitting: Internal Medicine

## 2017-10-23 ENCOUNTER — Non-Acute Institutional Stay (SKILLED_NURSING_FACILITY): Payer: Medicare Other | Admitting: Internal Medicine

## 2017-10-23 ENCOUNTER — Encounter: Payer: Self-pay | Admitting: Internal Medicine

## 2017-10-23 DIAGNOSIS — E038 Other specified hypothyroidism: Secondary | ICD-10-CM

## 2017-10-23 DIAGNOSIS — E1151 Type 2 diabetes mellitus with diabetic peripheral angiopathy without gangrene: Secondary | ICD-10-CM | POA: Diagnosis not present

## 2017-10-23 DIAGNOSIS — G309 Alzheimer's disease, unspecified: Secondary | ICD-10-CM | POA: Diagnosis not present

## 2017-10-23 DIAGNOSIS — I739 Peripheral vascular disease, unspecified: Secondary | ICD-10-CM

## 2017-10-23 DIAGNOSIS — R634 Abnormal weight loss: Secondary | ICD-10-CM | POA: Insufficient documentation

## 2017-10-23 DIAGNOSIS — F0280 Dementia in other diseases classified elsewhere without behavioral disturbance: Secondary | ICD-10-CM

## 2017-10-23 DIAGNOSIS — I10 Essential (primary) hypertension: Secondary | ICD-10-CM

## 2017-10-23 NOTE — Progress Notes (Signed)
Location:   [facility] Room Number: 149/W Place of Service:  SNF [phone]) Provider:  Veleta Miners MD  Fayrene Helper, MD  Patient Care Team: Fayrene Helper, MD as PCP - General  Extended Emergency Contact Information Primary Emergency Contact: Valarie Merino Address: [address] [place], [place] Phone: [phone] Work Phone: [phone] Relation: Daughter  Code Status:  DNR Goals of care: Advanced Directive information Advanced Directives 10/23/2017  Does Patient Have a Medical Advance Directive? Yes  Type of Advance Directive (No Data)  Does patient want to make changes to medical advance directive? No - Patient declined  Copy of [place] in Chart? -     Chief Complaint  Patient presents with  . Acute Visit    Patient is being seen for weight loss    HPI:  Pt is a 84 y.o. female seen today for an acute visit for Continuous  weight loss.  Patient has H/O Dementia, Diabetes Type 2, LE Edema,Hypothyroidism, Peripheral vascular disease and Chronic Venous stasis, Hyperlipidemia.  Patient seen again today because she continues to lose weight.  Her weight on my last visit was 210 and is now 195 pounds.  She has lost almost 15 pounds in past 2 months. Patient continues to deny any problems she says she does not hurt anywhere she states she does not like the food and does not have much appetite. Patient continues to deny any abdominal pain any reflux. She had EGD done which showed esophagitis with hernia but no mass.   Past Medical History:  Diagnosis Date  . Acute cystitis   . Dementia   . Dermatomycosis, unspecified   . DJD (degenerative joint disease)    Spine  . DM (diabetes mellitus) ([place])   . GERD (gastroesophageal reflux disease)   . History of pulmonary embolism   . History of supraventricular tachycardia   . Hyperlipidemia   . Hypertension   . Hypothyroidism    . Long term (current) use of anticoagulants    coumadin  . Obesity   . Other malaise and fatigue   . PVD (peripheral vascular disease) ([place])    Past Surgical History:  Procedure Laterality Date  . ATRIAL ABLATION SURGERY  01/2003   Catheter, to tachic palpitations  . ESOPHAGOGASTRODUODENOSCOPY N/A 09/30/2017   Procedure: ESOPHAGOGASTRODUODENOSCOPY (EGD);  Surgeon: Rogene Houston, MD;  Location: [facility];  Service: Endoscopy;  Laterality: N/A;  2:50  . MASTECTOMY  2003   Left  . THYROIDECTOMY, PARTIAL  1962    Allergies  Allergen Reactions  . Contrast Media [Iodinated Diagnostic Agents] Other (See Comments)    Unknown-listed on MAR-patient is not aware of these allergies  . Latex Other (See Comments)    Unknown-listed on MAR-patient is not aware of these allergies    Outpatient Encounter Medications as of 10/23/2017  Medication Sig  . acetaminophen (TYLENOL) 325 MG tablet Take 650 mg by mouth 2 (two) times daily.  Roseanne Kaufman [place]-Castor Oil (VENELEX) OINT Apply 1 application topically See admin instructions. Apply to sacrum and bilateral buttocks each shift and prn for prevention   . cilostazol (PLETAL) 50 MG tablet Take 50 mg by mouth 2 (two) times daily.    Marland Kitchen donepezil (ARICEPT) 10 MG tablet Take 10 mg by mouth every evening.   . escitalopram (LEXAPRO) 5 MG tablet Take 5 mg by mouth at bedtime.   . furosemide (  LASIX) 40 MG tablet Take 40 mg by mouth daily. Hold for systolic below BP below 616  . gabapentin (NEURONTIN) 300 MG capsule Take 300 mg by mouth 2 (two) times daily.    Marland Kitchen levothyroxine (SYNTHROID, LEVOTHROID) 50 MCG tablet Take 50 mcg by mouth daily.    Marland Kitchen lisinopril (PRINIVIL,ZESTRIL) 2.5 MG tablet Take 2.5 mg by mouth daily.  . memantine (NAMENDA) 10 MG tablet Take 10 mg by mouth 2 (two) times daily.   . metFORMIN (GLUCOPHAGE) 500 MG tablet Take 500 mg by mouth 2 (two) times daily with a meal.    . metolazone (ZAROXOLYN) 2.5 MG tablet Take 2.5 mg by mouth every  Monday, Wednesday, and Friday.   . pravastatin (PRAVACHOL) 80 MG tablet Take 80 mg by mouth at bedtime.    . ranitidine (ZANTAC) 150 MG capsule Take 1 capsule (150 mg total) by mouth 2 (two) times daily.  . [DISCONTINUED] aspirin 81 MG tablet Take 81 mg by mouth daily.    No facility-administered encounter medications on file as of 10/23/2017.      Review of Systems  Unable to perform ROS: Dementia    Immunization History  Administered Date(s) Administered  . Influenza Whole 10/11/2006  . Influenza-Unspecified 03/19/2009, 12/30/2016  . Pneumococcal Conjugate-13 12/30/2016  . Td 01/14/2003  . Tdap 12/15/2016   Pertinent  Health Maintenance Due  Topic Date Due  . URINE MICROALBUMIN  11/23/2017 (Originally 10/21/2017)  . INFLUENZA VACCINE  10/26/2017  . FOOT EXAM  12/21/2017  . PNA vac Low Risk Adult (2 of 2 - PPSV23) 12/30/2017  . OPHTHALMOLOGY EXAM  01/17/2018  . HEMOGLOBIN A1C  03/14/2018  . DEXA SCAN  Completed   Fall Risk  12/15/2016  Falls in the past year? No   Functional Status Survey:    There were no vitals filed for this visit. There is no height or weight on file to calculate BMI. Physical Exam  Constitutional: She appears well-developed and well-nourished.  HENT:  Head: Normocephalic.  Mouth/Throat: Oropharynx is clear and moist.  Eyes: Pupils are equal, round, and reactive to light.  Neck: Neck supple.  Cardiovascular: Normal rate and regular rhythm.  No murmur heard. Pulmonary/Chest: Effort normal and breath sounds normal. No stridor. No respiratory distress.  Abdominal: Soft. Bowel sounds are normal. She exhibits no distension. There is no tenderness. There is no guarding.  Musculoskeletal:  Mild Edema Bilateral  Neurological: She is alert.  Skin: Skin is warm and dry.  Psychiatric: She has a normal mood and affect. Her behavior is normal.    Labs reviewed: Recent Labs    07/04/17 0714 07/16/17 0930 09/11/17 0719  NA 140 137 143  K 4.0 4.0 4.0   CL 98* 96* 103  CO2 30 29 34*  GLUCOSE 129* 123* 124*  BUN 13 23* 12  CREATININE 1.12* 1.35* 1.13*  CALCIUM 9.2 8.9 9.0   Recent Labs    02/22/17 0708  AST 21  ALT 12*  ALKPHOS 72  BILITOT 0.7  PROT 7.7  ALBUMIN 3.9   Recent Labs    02/22/17 0708  03/27/17 0400 06/02/17 0725 07/16/17 0930 09/11/17 0719  WBC 6.7   < > 4.6 4.1 4.0 4.7  NEUTROABS 4.5  --  2.7  --   --  2.5  HGB 11.4*   < > 11.0* 11.4* 12.2 12.6  HCT 41.7   < > 40.9 40.7 41.8 42.1  MCV 83.2   < > 82.0 82.1 80.2 82.5  PLT 229   < >  247 211 195 183   < > = values in this interval not displayed.   Lab Results  Component Value Date   TSH 0.921 09/11/2017   Lab Results  Component Value Date   HGBA1C 7.3 (H) 09/12/2017   Lab Results  Component Value Date   CHOL 150 11/26/2016   HDL 45 11/26/2016   LDLCALC 85 11/26/2016   TRIG 99 11/26/2016   CHOLHDL 3.3 11/26/2016    Significant Diagnostic Results in last 30 days:  No results found.  Assessment/Plan Continous weight loss Discussed with the dietitian she said patient does eat a lot of snacks. But less of Her meals Patient already on Glucerna twice daily Will start her on Remeron and see if it helps her appetite I did try to call her daughter but was unable to reach her.  She did tell the nurse that she is not able to come and meet with me in the facility. At her age I do not think it is appropriate for any further work-up. This can be because of her progressive dementia. Lower extremity edema Continue on metolazone and Lasix Repeat BMP Diabetes with nephropathy and neuropathy Her blood sugars run less than 120 A1c was 7.3 in 06/19 B12 was normal in 03/19 Decrease Her Metformin to QD  Essential hypertension Had some documented Systolic blood pressure less than 100 Will discontinue Lisinopril  Peripheral vascular disease We will restart her aspirin Also on Pletal  Hypothyroidism TSH normal in 06/19  Alzheimer's dementia Stable on  Aricept and Namenda Depression Will Continue Lexapro Also Add Remeron Hyperlipidemia Continue pravastatin. LDL 85 in 09/18 GERD With Esophagitis Per EGD On Ranitidine BID    DEXA scan was Tscore -.1.Marland Kitchen12 in 08/18    Family/ staff Communication:   Labs/tests ordered:   Total time spent in this patient care encounter was 45_ minutes; greater than 50% of the visit spent counseling patient, reviewing records , Labs and coordinating care for problems addressed at this encounter.

## 2017-10-24 ENCOUNTER — Encounter (HOSPITAL_COMMUNITY)
Admission: RE | Admit: 2017-10-24 | Discharge: 2017-10-24 | Disposition: A | Discharge status: Home / Self Care | Payer: Medicare Other | Source: Skilled Nursing Facility | Attending: Internal Medicine | Admitting: Internal Medicine

## 2017-10-24 DIAGNOSIS — G4700 Insomnia, unspecified: Secondary | ICD-10-CM | POA: Insufficient documentation

## 2017-10-24 DIAGNOSIS — F329 Major depressive disorder, single episode, unspecified: Secondary | ICD-10-CM | POA: Diagnosis present

## 2017-10-24 DIAGNOSIS — E039 Hypothyroidism, unspecified: Secondary | ICD-10-CM | POA: Diagnosis present

## 2017-10-24 DIAGNOSIS — D649 Anemia, unspecified: Secondary | ICD-10-CM | POA: Diagnosis present

## 2017-10-24 DIAGNOSIS — I10 Essential (primary) hypertension: Secondary | ICD-10-CM | POA: Insufficient documentation

## 2017-10-24 DIAGNOSIS — G629 Polyneuropathy, unspecified: Secondary | ICD-10-CM | POA: Insufficient documentation

## 2017-10-24 LAB — BASIC METABOLIC PANEL
Anion gap: 9 (ref 5–15)
BUN: 18 mg/dL (ref 8–23)
CALCIUM: 9.2 mg/dL (ref 8.9–10.3)
CO2: 33 mmol/L — ABNORMAL HIGH (ref 22–32)
Chloride: 98 mmol/L (ref 98–111)
Creatinine, Ser: 1.3 mg/dL — ABNORMAL HIGH (ref 0.44–1.00)
GFR calc Af Amer: 40 mL/min — ABNORMAL LOW (ref >60)
GFR, EST NON AFRICAN AMERICAN: 34 mL/min — AB (ref >60)
Glucose, Bld: 115 mg/dL — ABNORMAL HIGH (ref 70–99)
POTASSIUM: 3.9 mmol/L (ref 3.5–5.1)
Sodium: 140 mmol/L (ref 135–145)

## 2017-10-26 DEATH — deceased

## 2017-10-30 ENCOUNTER — Non-Acute Institutional Stay (SKILLED_NURSING_FACILITY): Payer: Medicare Other | Admitting: Internal Medicine

## 2017-10-30 ENCOUNTER — Encounter: Payer: Self-pay | Admitting: Internal Medicine

## 2017-10-30 DIAGNOSIS — G309 Alzheimer's disease, unspecified: Secondary | ICD-10-CM

## 2017-10-30 DIAGNOSIS — E1151 Type 2 diabetes mellitus with diabetic peripheral angiopathy without gangrene: Secondary | ICD-10-CM

## 2017-10-30 DIAGNOSIS — K219 Gastro-esophageal reflux disease without esophagitis: Secondary | ICD-10-CM | POA: Diagnosis not present

## 2017-10-30 DIAGNOSIS — R600 Localized edema: Secondary | ICD-10-CM

## 2017-10-30 DIAGNOSIS — I10 Essential (primary) hypertension: Secondary | ICD-10-CM | POA: Diagnosis not present

## 2017-10-30 DIAGNOSIS — F0280 Dementia in other diseases classified elsewhere without behavioral disturbance: Secondary | ICD-10-CM

## 2017-10-30 DIAGNOSIS — R634 Abnormal weight loss: Secondary | ICD-10-CM

## 2017-10-30 NOTE — Progress Notes (Signed)
Location:   [facility] Room Number: 149/W Place of Service:  SNF [phone]) Provider:  Tera Helper, MD  Patient Care Team: Fayrene Helper, MD as PCP - General  Extended Emergency Contact Information Primary Emergency Contact: Valarie Merino Address: [address], [place] Phone: [phone] Work Phone: [phone] Relation: Daughter  Code Status:  DNR Goals of care: Advanced Directive information Advanced Directives 10/30/2017  Does Patient Have a Medical Advance Directive? Yes  Type of Advance Directive (No Data)  Does patient want to make changes to medical advance directive? No - Patient declined  Copy of [place] in Chart? -     Chief Complaint  Patient presents with  . Medical Management of Chronic Issues    Patient is being seen for routine visit of  Medical Management, Patient is due Albumin    Medical management of chronic medical conditions including dementia- type 2 diabetes-venous stasis edema-hypothyroidism- peripheral vascular disease-hyperlipidemia-hypertension-depression-GERD- recent weight loss  HPI:  Pt is a 84 y.o. female seen today for medical management of chronic diseases.  As noted above.  Most recent acute issue has been some weight loss.  She is lost about 10 to 15 pounds over the past couple months  Per staff she does eat snacks but is quite picky about the meals provided by the facility.  Is also on supplementation.  Patient states that her appetite is fine she just does not feel like eating all the time she denies any difficulty swallowing she did have an EGD done recently which did not show any mass it did show esophagitis-she is on Zantac twice a day She has recently been started on Remeron with hopes of possible some appetite stimulation   Her other medical issues appear to be stable- her lisinopril was recently discontinued  secondary to some blood pressures that showed a systolic less than 427- she does continue on Lasix- as well as metolazone- blood pressure earlier today was 118/78-I got 142/78 later this afternoon  In regards to dementia she continues on Aricept and Namenda-she appears to be doing very well with supportive care- cannot rule out possibility that somewhat progressing dementia may be contributing to her weight loss as well   Regards to lower extremity edema this appears stable   She is also a type II diabetic blood sugars have been quite stable in the low 100s and Glucophage was recently reduced to once a day- blood sugars appear to be staying in the lower 100s her hemoglobin A1c was 7.3 in June   She also has a history of anemia she is on a statin LDL was 85 on lab done in September- considering her advanced age have not been real aggressive with this.  At one point she also appeared to be subdued and not herself she was started on Lexapro and her mood did return back to his baseline which is usually jovial and talkative- this is been stable now for some time.  She is also has a history of peripheral vascular disease continues on Pletal as well as aspirin.  She also has a history of hypothyroidism she is on Synthroid TSH is been stable for some time it was 0.921 on lab done in June previously had been 1.087  Currently she is lying in bed comfortably says she is just taking a nap and feels fine she is her usual jovial  talkative self   Past Medical History:  Diagnosis Date  . Acute cystitis   . Dementia   . Dermatomycosis, unspecified   . DJD (degenerative joint disease)    Spine  . DM (diabetes mellitus) ([place])   . GERD (gastroesophageal reflux disease)   . History of pulmonary embolism   . History of supraventricular tachycardia   . Hyperlipidemia   . Hypertension   . Hypothyroidism   . Long term (current) use of anticoagulants    coumadin  . Obesity   . Other malaise and fatigue     . PVD (peripheral vascular disease) ([place])    Past Surgical History:  Procedure Laterality Date  . ATRIAL ABLATION SURGERY  01/2003   Catheter, to tachic palpitations  . ESOPHAGOGASTRODUODENOSCOPY N/A 10/06/2017   Procedure: ESOPHAGOGASTRODUODENOSCOPY (EGD);  Surgeon: Rogene Houston, MD;  [facility];  Service: Endoscopy;  Laterality: N/A;  2:50  . MASTECTOMY  2003   Left  . THYROIDECTOMY, PARTIAL  1962    Allergies  Allergen Reactions  . Contrast Media [Iodinated Diagnostic Agents] Other (See Comments)    Unknown-listed on MAR-patient is not aware of these allergies  . Latex Other (See Comments)    Unknown-listed on MAR-patient is not aware of these allergies    Outpatient Encounter Medications as of 10/30/2017  Medication Sig  . acetaminophen (TYLENOL) 325 MG tablet Take 650 mg by mouth 2 (two) times daily.  Marland Kitchen aspirin 81 MG chewable tablet Chew 81 mg by mouth daily.  Roseanne Kaufman Peru-Castor Oil (VENELEX) OINT Apply 1 application topically See admin instructions. Apply to sacrum and bilateral buttocks each shift and prn for prevention   . cilostazol (PLETAL) 50 MG tablet Take 50 mg by mouth 2 (two) times daily.    Marland Kitchen donepezil (ARICEPT) 10 MG tablet Take 10 mg by mouth every evening.   . escitalopram (LEXAPRO) 5 MG tablet Take 5 mg by mouth at bedtime.   . furosemide (LASIX) 40 MG tablet Take 40 mg by mouth daily. Hold for systolic below BP below 376  . gabapentin (NEURONTIN) 300 MG capsule Take 300 mg by mouth 2 (two) times daily.    Marland Kitchen levothyroxine (SYNTHROID, LEVOTHROID) 50 MCG tablet Take 50 mcg by mouth daily.    . memantine (NAMENDA) 10 MG tablet Take 10 mg by mouth 2 (two) times daily.   . metFORMIN (GLUCOPHAGE) 500 MG tablet Take 500 mg by mouth 2 (two) times daily with a meal.    . metolazone (ZAROXOLYN) 2.5 MG tablet Take 2.5 mg by mouth every Monday, Wednesday, and Friday.   . mirtazapine (REMERON) 15 MG tablet Take 7.5 mg by mouth at bedtime.  . Multiple  Vitamins-Minerals (MULTIVITAMIN WITH MINERALS) tablet Take 1 tablet by mouth daily.  . pravastatin (PRAVACHOL) 80 MG tablet Take 80 mg by mouth at bedtime.    . ranitidine (ZANTAC) 150 MG capsule Take 1 capsule (150 mg total) by mouth 2 (two) times daily.  . [DISCONTINUED] lisinopril (PRINIVIL,ZESTRIL) 2.5 MG tablet Take 2.5 mg by mouth daily.   No facility-administered encounter medications on file as of 10/30/2017.      Review of Systems   This is limited secondary to patient being a poor historian and usually does not complain about anything-she says she is doing fine does not complain of abdominal pain difficulty swallowing-feels she is doing very well-nursing does not report any acute issues  Immunization History  Administered Date(s) Administered  . Influenza Whole 10/11/2006  . Influenza-Unspecified  03/19/2009, 12/30/2016  . Pneumococcal Conjugate-13 12/30/2016  . Td 01/14/2003  . Tdap 12/15/2016   Pertinent  Health Maintenance Due  Topic Date Due  . URINE MICROALBUMIN  11/23/2017 (Originally 10/21/2017)  . INFLUENZA VACCINE  11/30/2017 (Originally 10/26/2017)  . FOOT EXAM  12/21/2017  . PNA vac Low Risk Adult (2 of 2 - PPSV23) 12/30/2017  . OPHTHALMOLOGY EXAM  01/17/2018  . HEMOGLOBIN A1C  03/14/2018  . DEXA SCAN  Completed   Fall Risk  12/15/2016  Falls in the past year? No   Functional Status Survey:    Vitals:   10/30/17 1535  BP: 118/78  Pulse: 62  Resp: 16  Temp: (!) 97.4 F (36.3 C)  TempSrc: Oral  SpO2: 96%  Weight: 198 lb (89.8 kg)  Height: 5\' 9"  (1.753 m)  Manual blood pressure later this afternoon was 142/78 Body mass index is 29.24 kg/m. Physical Exam   In general this is a pleasant talkative elderly female who appears well-developed and in no distress.  Her skin is warm and dry.  Eyes visual acuity appears to be intact sclera and conjunctive are clear.  Oropharynx is clear mucous membranes moist.  Chest is clear to auscultation there is no  labored breathing.  Heart is regular rate and rhythm without murmur gallop or rub she does have mild lower extremity edema-this does not appear to be increased from previous exams if anything possibly decreased.  Abdomen is somewhat obese soft nontender with positive bowel sounds.  Musculoskeletal Limited exam since she is in bed but is able to move all extremities x4 it appears at baseline with baseline strength and some chronic lower extremity edema with venous stasis edema.  Neurologic is grossly intact her speech is clear no lateralizing findings.  Psych she is alert and oriented to self can carry on a short conversation continues to be quite talkative and jovial at her baseline-  Labs reviewed: Recent Labs    07/16/17 0930 09/11/17 0719 10/24/17 0700  NA 137 143 140  K 4.0 4.0 3.9  CL 96* 103 98  CO2 29 34* 33*  GLUCOSE 123* 124* 115*  BUN 23* 12 18  CREATININE 1.35* 1.13* 1.30*  CALCIUM 8.9 9.0 9.2   Recent Labs    02/22/17 0708  AST 21  ALT 12*  ALKPHOS 72  BILITOT 0.7  PROT 7.7  ALBUMIN 3.9   Recent Labs    02/22/17 0708  03/27/17 0400 06/02/17 0725 07/16/17 0930 09/11/17 0719  WBC 6.7   < > 4.6 4.1 4.0 4.7  NEUTROABS 4.5  --  2.7  --   --  2.5  HGB 11.4*   < > 11.0* 11.4* 12.2 12.6  HCT 41.7   < > 40.9 40.7 41.8 42.1  MCV 83.2   < > 82.0 82.1 80.2 82.5  PLT 229   < > 247 211 195 183   < > = values in this interval not displayed.   Lab Results  Component Value Date   TSH 0.921 09/11/2017   Lab Results  Component Value Date   HGBA1C 7.3 (H) 09/12/2017   Lab Results  Component Value Date   CHOL 150 11/26/2016   HDL 45 11/26/2016   LDLCALC 85 11/26/2016   TRIG 99 11/26/2016   CHOLHDL 3.3 11/26/2016    Significant Diagnostic Results in last 30 days:  No results found.  Assessment/Plan  #1-history of weight loss- per review of weights it appears this is stabilized weight is198today which is  actually up a couple pounds although this could be  scale variation-she has just been started on Remeron she continues on supplements--including Glucerna twice daily At this point continue to monitor and encourage p.o. intake- I did discuss this with her as well and she stated she would try to eat more although I suspect this will be a challenge  #2-history of dementia she appears to be doing well with supportive care again her weight loss may be partly attributable to this-she is on Aricept and Namenda  #3-type 2 diabetes this continues to be stable with CBGs in the low 100s hemoglobin A1c was 7.3 in June- she is on Glucophage now once a day.  4-lower extremity edema-this appears to be stable on Lasix  as well as 3 times weekly metolazone- recent metabolic pane on July 30 l showed stability as well With BUN of 18 and creatinine of 1.3 which appears to be relatively baseline-will monitor periodically- sodium was 140 potassium 3.9  #5- history of hypertension again her lisinopril was discontinued this appears to be stable systolics today [phone] this is in acceptable range.  6.-  History of hypothyroidism as noted above this has been stable for some time on current dose of Synthroid-TSH's have been within normal limits  7.  Hyperlipidemia LDL 85 in September 2018 she is on a statin-at this point will continue current dose she appears to tolerate this well- have been somewhat conservative considering her relatively advanced age.  8- depression this appears stable on Lexapro she appeared to really respond well after having some subdued behaviors  #9- history of peripheral vascular disease this appears stable on aspirin and Pletal does not really complain of discomfort or leg pain today #10- esophagitis- continues on Zantac twice daily this appears stable.  She did have an EGD done last month secondary to a CT scan that showed soft tissue at the fundus of the stomach at the gastroesophageal junction- however EGD did not reveal a mass it did show  esophagitis with a small hiatal hernia.  She appears to be doing well with the Zantac  I do not believe this is contributing to her weight loss She does not report any difficulty swallowing or burning  #11 history of osteoarthritis she is on Tylenol twice daily at one point she complained of pain mainly in her legs but is not really complaining of that on exam today  [phone]

## 2017-11-22 ENCOUNTER — Encounter: Payer: Self-pay | Admitting: Internal Medicine

## 2017-11-22 NOTE — Progress Notes (Signed)
This encounter was created in error - please disregard.

## 2017-12-28 ENCOUNTER — Encounter: Payer: Self-pay | Admitting: Internal Medicine

## 2017-12-28 ENCOUNTER — Non-Acute Institutional Stay (SKILLED_NURSING_FACILITY): Payer: Medicare Other

## 2017-12-28 ENCOUNTER — Non-Acute Institutional Stay (SKILLED_NURSING_FACILITY): Payer: Medicare Other | Admitting: Internal Medicine

## 2017-12-28 DIAGNOSIS — E038 Other specified hypothyroidism: Secondary | ICD-10-CM | POA: Diagnosis not present

## 2017-12-28 DIAGNOSIS — F0280 Dementia in other diseases classified elsewhere without behavioral disturbance: Secondary | ICD-10-CM

## 2017-12-28 DIAGNOSIS — G309 Alzheimer's disease, unspecified: Secondary | ICD-10-CM

## 2017-12-28 DIAGNOSIS — I739 Peripheral vascular disease, unspecified: Secondary | ICD-10-CM

## 2017-12-28 DIAGNOSIS — I10 Essential (primary) hypertension: Secondary | ICD-10-CM

## 2017-12-28 DIAGNOSIS — R600 Localized edema: Secondary | ICD-10-CM

## 2017-12-28 DIAGNOSIS — E1151 Type 2 diabetes mellitus with diabetic peripheral angiopathy without gangrene: Secondary | ICD-10-CM | POA: Diagnosis not present

## 2017-12-28 DIAGNOSIS — K219 Gastro-esophageal reflux disease without esophagitis: Secondary | ICD-10-CM | POA: Diagnosis not present

## 2017-12-28 DIAGNOSIS — E782 Mixed hyperlipidemia: Secondary | ICD-10-CM

## 2017-12-28 DIAGNOSIS — Z0000 Encounter for general adult medical examination without abnormal findings: Secondary | ICD-10-CM | POA: Diagnosis not present

## 2017-12-28 NOTE — Progress Notes (Signed)
Location:    [facility] Room Number: 149/W Place of Service:  SNF [phone]) Provider: Veleta Miners MD  Fayrene Helper, MD  Patient Care Team: Fayrene Helper, MD as PCP - General  Extended Emergency Contact Information Primary Emergency Contact: Valarie Merino Address: [place], [place] Phone: [phone] Work Phone: [phone] Relation: Daughter  Code Status:  DNR Goals of care: Advanced Directive information Advanced Directives 12/28/2017  Does Patient Have a Medical Advance Directive? Yes  Type of Advance Directive (No Data)  Does patient want to make changes to medical advance directive? No - Patient declined  Copy of [place] in Chart? -     Chief Complaint  Patient presents with  . Medical Management of Chronic Issues    Patient is being seen for routine visit of medical management     HPI:  Pt is a 84 y.o. female seen today for medical management of chronic diseases.    Patient has H/O Dementia, Diabetes Type 2, LE Edema,Hypothyroidism, Peripheral vascular disease and Chronic Venous stasis, Hyperlipidemia. GERD with Esophagitis.and Depression.  Patient is Long term Geographical information systems officer. Her main Problem was Weight loss but for Past Few months her Weight has Stabilized and she is now 203 lbs which is gain of 10 lbs. She continues to [facility] any problems. No New Nursing issues.    Past Medical History:  Diagnosis Date  . Acute cystitis   . Dementia ([place])   . Dermatomycosis, unspecified   . DJD (degenerative joint disease)    Spine  . DM (diabetes mellitus) ([place])   . GERD (gastroesophageal reflux disease)   . History of pulmonary embolism   . History of supraventricular tachycardia   . Hyperlipidemia   . Hypertension   . Hypothyroidism   . Long term (current) use of anticoagulants    coumadin  . Obesity   . Other malaise and fatigue   . PVD  (peripheral vascular disease) ([place])    Past Surgical History:  Procedure Laterality Date  . ATRIAL ABLATION SURGERY  01/2003   Catheter, to tachic palpitations  . ESOPHAGOGASTRODUODENOSCOPY N/A 10/08/2017   Procedure: ESOPHAGOGASTRODUODENOSCOPY (EGD);  Surgeon: Rogene Houston, MD;  [facility];  Service: Endoscopy;  Laterality: N/A;  2:50  . MASTECTOMY  2003   Left  . THYROIDECTOMY, PARTIAL  1962    Allergies  Allergen Reactions  . Contrast Media [Iodinated Diagnostic Agents] Other (See Comments)    Unknown-listed on MAR-patient is not aware of these allergies  . Latex Other (See Comments)    Unknown-listed on MAR-patient is not aware of these allergies    Outpatient Encounter Medications as of 12/28/2017  Medication Sig  . acetaminophen (TYLENOL) 325 MG tablet Take 650 mg by mouth 2 (two) times daily.  Marland Kitchen aspirin 81 MG chewable tablet Chew 81 mg by mouth daily.  Roseanne Kaufman Peru-Castor Oil (VENELEX) OINT Apply 1 application topically See admin instructions. Apply to sacrum and bilateral buttocks each shift and prn for prevention   . cilostazol (PLETAL) 50 MG tablet Take 50 mg by mouth 2 (two) times daily.    Marland Kitchen donepezil (ARICEPT) 10 MG tablet Take 10 mg by mouth every evening.   . escitalopram (LEXAPRO) 5 MG tablet Take 5 mg by mouth at bedtime.   . furosemide (LASIX) 40 MG tablet Take 40 mg by mouth daily. Hold for systolic below  BP below 100  . gabapentin (NEURONTIN) 300 MG capsule Take 300 mg by mouth 2 (two) times daily.    Marland Kitchen levothyroxine (SYNTHROID, LEVOTHROID) 50 MCG tablet Take 50 mcg by mouth daily.    . memantine (NAMENDA) 10 MG tablet Take 10 mg by mouth 2 (two) times daily.   . metFORMIN (GLUCOPHAGE) 500 MG tablet Take 500 mg by mouth daily with breakfast.   . metolazone (ZAROXOLYN) 2.5 MG tablet Take 2.5 mg by mouth every Monday, Wednesday, and Friday.   . mirtazapine (REMERON) 15 MG tablet Take 7.5 mg by mouth at bedtime.  . Multiple Vitamins-Minerals  (MULTIVITAMIN WITH MINERALS) tablet Take 1 tablet by mouth daily.  . pravastatin (PRAVACHOL) 80 MG tablet Take 80 mg by mouth at bedtime.    . ranitidine (ZANTAC) 150 MG capsule Take 1 capsule (150 mg total) by mouth 2 (two) times daily.   No facility-administered encounter medications on file as of 12/28/2017.     Review of Systems  Unable to perform ROS: Dementia    Immunization History  Administered Date(s) Administered  . Influenza Whole 10/11/2006  . Influenza-Unspecified 03/19/2009, 12/30/2016  . Pneumococcal Conjugate-13 12/30/2016  . Td 01/14/2003  . Tdap 12/15/2016   Pertinent  Health Maintenance Due  Topic Date Due  . INFLUENZA VACCINE  10/26/2017  . URINE MICROALBUMIN  01/28/2018 (Originally 10/21/2017)  . PNA vac Low Risk Adult (2 of 2 - PPSV23) 12/30/2017  . OPHTHALMOLOGY EXAM  01/17/2018  . HEMOGLOBIN A1C  03/14/2018  . FOOT EXAM  09/26/2018  . DEXA SCAN  Completed   Fall Risk  12/28/2017 12/15/2016  Falls in the past year? No No   Functional Status Survey:    Vitals:   12/28/17 1058  BP: 123/65  Pulse: 62  Resp: 20  Temp: 98.3 F (36.8 C)  TempSrc: Oral  SpO2: 99%  Weight: 203 lb 12.8 oz (92.4 kg)  Height: 5\' 9"  (1.753 m)   Body mass index is 30.1 kg/m. Physical Exam  Constitutional: She appears well-developed and well-nourished.  HENT:  Head: Normocephalic.  Mouth/Throat: Oropharynx is clear and moist.  Eyes: Pupils are equal, round, and reactive to light.  Neck: Neck supple.  Cardiovascular: Normal rate and regular rhythm.  No murmur heard. Pulmonary/Chest: Effort normal and breath sounds normal. No stridor. No respiratory distress.  Abdominal: Soft. Bowel sounds are normal. She exhibits no distension. There is no tenderness. There is no guarding.  Musculoskeletal:  Mild Edema Bilateral  Neurological: She is alert.  Skin: Skin is warm and dry.  Psychiatric: She has a normal mood and affect. Her behavior is normal.    Labs  reviewed: Recent Labs    07/16/17 0930 09/11/17 0719 10/24/17 0700  NA 137 143 140  K 4.0 4.0 3.9  CL 96* 103 98  CO2 29 34* 33*  GLUCOSE 123* 124* 115*  BUN 23* 12 18  CREATININE 1.35* 1.13* 1.30*  CALCIUM 8.9 9.0 9.2   Recent Labs    02/22/17 0708  AST 21  ALT 12*  ALKPHOS 72  BILITOT 0.7  PROT 7.7  ALBUMIN 3.9   Recent Labs    02/22/17 0708  03/27/17 0400 06/02/17 0725 07/16/17 0930 09/11/17 0719  WBC 6.7   < > 4.6 4.1 4.0 4.7  NEUTROABS 4.5  --  2.7  --   --  2.5  HGB 11.4*   < > 11.0* 11.4* 12.2 12.6  HCT 41.7   < > 40.9 40.7 41.8 42.1  MCV  83.2   < > 82.0 82.1 80.2 82.5  PLT 229   < > 247 211 195 183   < > = values in this interval not displayed.   Lab Results  Component Value Date   TSH 0.921 09/11/2017   Lab Results  Component Value Date   HGBA1C 7.3 (H) 09/12/2017   Lab Results  Component Value Date   CHOL 150 11/26/2016   HDL 45 11/26/2016   LDLCALC 85 11/26/2016   TRIG 99 11/26/2016   CHOLHDL 3.3 11/26/2016    Significant Diagnostic Results in last 30 days:  No results found.  Assessment/Plan  Lower extremity edema Continue on metolazone and Lasix Her weight has stabilized now. And her edema is Better Repeat BMP Diabetes with nephropathy and neuropathy Her blood sugars run less than 120 A1c was 7.3 in 06/19 Repeat A1C B12 was normal in 03/19 Decreased Her Metformin to QD in last visit. Continues to be on Neurontin Essential hypertension BP stable Peripheral vascular disease On aspirin Also on Pletal and Statin Hypothyroidism TSH normal in06/19 Alzheimer's dementia Stable on Aricept and Namenda Depression Will Continue Lexapro Also on  Remeron Any GDR will cause her symptoms to return Hyperlipidemia Continue pravastatin. LDL 85 in 09/18  GERD With Esophagitis Per EGD On Ranitidine BID H/O Weight Loss which has now stabilized She has done good on Remeron and Nutrition Supplements She had CT Abdomen and Chest  which was negative for any Acute Process As d/w her Daughter no Aggressive work up any more    DEXA scan was Tscore -.1.Marland Kitchen12 in 08/18    Family/ staff Communication:   Labs/tests ordered:  Bmp,CBC, Urine for Microalbuminuria, Vit D, A1c Total time spent in this patient care encounter was 25_ minutes; greater than 50% of the visit spent counseling patient, reviewing records , Labs and coordinating care for problems addressed at this encounter.

## 2017-12-28 NOTE — Progress Notes (Signed)
Subjective:   Brandi Rice is a 84 y.o. female who presents for Medicare Annual (Subsequent) preventive examination at [place]  Last AWV-12/15/2016    Objective:     Vitals: BP 118/74 (BP Location: Left Arm, Patient Position: Supine)   Pulse 66   Temp 98.4 F (36.9 C) (Oral)   Ht 5\' 9"  (1.753 m)   Wt 198 lb (89.8 kg)   BMI 29.24 kg/m   Body mass index is 29.24 kg/m.  Advanced Directives 12/28/2017 12/28/2017 10/30/2017 10/23/2017 10/24/2017 09/08/2017 08/29/2017  Does Patient Have a Medical Advance Directive? Yes Yes Yes Yes (No Data) Yes Yes  Type of Advance Directive (No Data) (No Data) (No Data) (No Data) - (No Data) (No Data)  Does patient want to make changes to medical advance directive? No - Patient declined No - Patient declined No - Patient declined No - Patient declined - No - Patient declined No - Patient declined  Copy of Kaplan in Chart? - - - - - - -    Tobacco Social History   Tobacco Use  Smoking Status Former Smoker  Smokeless Tobacco Never Used     Counseling given: Not Answered   Clinical Intake:  Pre-visit preparation completed: No  Pain : No/denies pain     Diabetes: Yes CBG done?: No Did pt. bring in CBG monitor from home?: No  How often do you need to have someone help you when you read instructions, pamphlets, or other written materials from your doctor or pharmacy?: 3 - Sometimes What is the last grade level you completed in school?: 5th grade  Interpreter Needed?: No  Information entered by :: Tyson Dense, RN  Past Medical History:  Diagnosis Date  . Acute cystitis   . Dementia ([place])   . Dermatomycosis, unspecified   . DJD (degenerative joint disease)    Spine  . DM (diabetes mellitus) ([place])   . GERD (gastroesophageal reflux disease)   . History of pulmonary embolism   . History of supraventricular tachycardia   . Hyperlipidemia   . Hypertension   . Hypothyroidism   . Long term  (current) use of anticoagulants    coumadin  . Obesity   . Other malaise and fatigue   . PVD (peripheral vascular disease) ([place])    Past Surgical History:  Procedure Laterality Date  . ATRIAL ABLATION SURGERY  01/2003   Catheter, to tachic palpitations  . ESOPHAGOGASTRODUODENOSCOPY N/A 10/06/2017   Procedure: ESOPHAGOGASTRODUODENOSCOPY (EGD);  Surgeon: Rogene Houston, MD;  Location[facility];  Service: Endoscopy;  Laterality: N/A;  2:50  . MASTECTOMY  2003   Left  . THYROIDECTOMY, PARTIAL  1962   Family History  Problem Relation Age of Onset  . Coronary artery disease Sister   . Coronary artery disease Sister   . Coronary artery disease Sister   . Heart attack Sister   . Alcohol abuse Sister    Social History   Socioeconomic History  . Marital status: Widowed    Spouse name: Not on file  . Number of children: Not on file  . Years of education: Not on file  . Highest education level: Not on file  Occupational History  . Occupation: Retired    Fish farm manager: RETIRED  Social Needs  . Financial resource strain: Not hard at all  . Food insecurity:    Worry: Never true    Inability: Never true  . Transportation needs:    Medical:  No    Non-medical: No  Tobacco Use  . Smoking status: Former Research scientist (life sciences)  . Smokeless tobacco: Never Used  Substance and Sexual Activity  . Alcohol use: No  . Drug use: No  . Sexual activity: Not on file  Lifestyle  . Physical activity:    Days per week: 0 days    Minutes per session: 0 min  . Stress: Not at all  Relationships  . Social connections:    Talks on phone: Never    Gets together: More than three times a week    Attends religious service: Never    Active member of club or organization: No    Attends meetings of clubs or organizations: Never    Relationship status: Widowed  Other Topics Concern  . Not on file  Social History Narrative   Widowed   One daughter    Outpatient Encounter Medications as of 12/28/2017  Medication  Sig  . acetaminophen (TYLENOL) 325 MG tablet Take 650 mg by mouth 2 (two) times daily.  Marland Kitchen aspirin 81 MG chewable tablet Chew 81 mg by mouth daily.  Roseanne Kaufman Peru-Castor Oil (VENELEX) OINT Apply 1 application topically See admin instructions. Apply to sacrum and bilateral buttocks each shift and prn for prevention   . cilostazol (PLETAL) 50 MG tablet Take 50 mg by mouth 2 (two) times daily.    Marland Kitchen donepezil (ARICEPT) 10 MG tablet Take 10 mg by mouth every evening.   . escitalopram (LEXAPRO) 5 MG tablet Take 5 mg by mouth at bedtime.   . furosemide (LASIX) 40 MG tablet Take 40 mg by mouth daily. Hold for systolic below BP below 782  . gabapentin (NEURONTIN) 300 MG capsule Take 300 mg by mouth 2 (two) times daily.    Marland Kitchen levothyroxine (SYNTHROID, LEVOTHROID) 50 MCG tablet Take 50 mcg by mouth daily.    . memantine (NAMENDA) 10 MG tablet Take 10 mg by mouth 2 (two) times daily.   . metFORMIN (GLUCOPHAGE) 500 MG tablet Take 500 mg by mouth 2 (two) times daily with a meal.    . metolazone (ZAROXOLYN) 2.5 MG tablet Take 2.5 mg by mouth every Monday, Wednesday, and Friday.   . mirtazapine (REMERON) 15 MG tablet Take 7.5 mg by mouth at bedtime.  . Multiple Vitamins-Minerals (MULTIVITAMIN WITH MINERALS) tablet Take 1 tablet by mouth daily.  . pravastatin (PRAVACHOL) 80 MG tablet Take 80 mg by mouth at bedtime.    . ranitidine (ZANTAC) 150 MG capsule Take 1 capsule (150 mg total) by mouth 2 (two) times daily.   No facility-administered encounter medications on file as of 12/28/2017.     Activities of Daily Living In your present state of health, do you have any difficulty performing the following activities: 12/28/2017  Hearing? N  Vision? N  Difficulty concentrating or making decisions? Y  Walking or climbing stairs? Y  Dressing or bathing? Y  Doing errands, shopping? Y  Preparing Food and eating ? Y  Using the Toilet? Y  In the past six months, have you accidently leaked urine? Y  Do you have  problems with loss of bowel control? Y  Managing your Medications? Y  Managing your Finances? Y  Housekeeping or managing your Housekeeping? Y  Some recent data might be hidden    Patient Care Team: Fayrene Helper, MD as PCP - General    Assessment:   This is a routine wellness examination for [facility].  Exercise Activities and Dietary recommendations Current Exercise Habits: The  patient does not participate in regular exercise at present, Exercise limited by: orthopedic condition(s)  Goals   None     Fall Risk Fall Risk  12/28/2017 12/15/2016  Falls in the past year? No No   Is the patient's home free of loose throw rugs in walkways, pet beds, electrical cords, etc?   yes      Grab bars in the bathroom? yes      Handrails on the stairs?   yes      Adequate lighting?   yes  Depression Screen PHQ 2/9 Scores 12/28/2017 12/15/2016  PHQ - 2 Score 0 0     Cognitive Function     6CIT Screen 12/28/2017 12/15/2016  What Year? 4 points 4 points  What month? 3 points 3 points  What time? 3 points 0 points  Count back from 20 0 points 0 points  Months in reverse 2 points 2 points  Repeat phrase 10 points 8 points  Total Score 22 17    Immunization History  Administered Date(s) Administered  . Influenza Whole 10/11/2006  . Influenza-Unspecified 03/19/2009, 12/30/2016  . Pneumococcal Conjugate-13 12/30/2016  . Td 01/14/2003  . Tdap 12/15/2016    Qualifies for Shingles Vaccine? Not in past records  Screening Tests Health Maintenance  Topic Date Due  . URINE MICROALBUMIN  10/21/2017  . INFLUENZA VACCINE  10/26/2017  . PNA vac Low Risk Adult (2 of 2 - PPSV23) 12/30/2017  . OPHTHALMOLOGY EXAM  01/17/2018  . HEMOGLOBIN A1C  03/14/2018  . FOOT EXAM  09/26/2018  . TETANUS/TDAP  12/16/2026  . DEXA SCAN  Completed    Cancer Screenings: Lung: Low Dose CT Chest recommended if Age 51-80 years, 30 pack-year currently smoking OR have quit w/in 15years. Patient does not  qualify. Breast:  Up to date on Mammogram? Yes   Up to date of Bone Density/Dexa? Yes Colorectal: up to date  Additional Screenings:  Hepatitis C Screening: declined Flu vaccine due: will receive at [facility]:     I have personally reviewed and addressed the Medicare Annual Wellness questionnaire and have noted the following in the patient's chart:  A. Medical and social history B. Use of alcohol, tobacco or illicit drugs  C. Current medications and supplements D. Functional ability and status E.  Nutritional status F.  Physical activity G. Advance directives H. List of other physicians I.  Hospitalizations, surgeries, and ER visits in previous 12 months J.  [place] to include hearing, vision, cognitive, depression L. Referrals and appointments - none  In addition, I have reviewed and discussed with patient certain preventive protocols, quality metrics, and best practice recommendations. A written personalized care plan for preventive services as well as general preventive health recommendations were provided to patient.  See attached scanned questionnaire for additional information.   Signed,   Tyson Dense, RN Nurse Health Advisor  Patient Concerns: None

## 2017-12-28 NOTE — Patient Instructions (Signed)
Brandi Rice , Thank you for taking time to come for your Medicare Wellness Visit. I appreciate your ongoing commitment to your health goals. Please review the following plan we discussed and let me know if I can assist you in the future.   Screening recommendations/referrals: Colonoscopy excluded, over age 82 Mammogram excluded, over age 55 Bone Density up to date Recommended yearly ophthalmology/optometry visit for glaucoma screening and checkup Recommended yearly dental visit for hygiene and checkup  Vaccinations: Influenza vaccine due, will receive at [facility] Pneumococcal vaccine up to date, completed Tdap vaccine up to date, due 12/16/2026 Shingles vaccine not in past records    Advanced directives: in chart  Conditions/risks identified: none  Next appointment: Dr. Lyndel Safe makes rounds   Preventive Care 65 Years and Older, Female Preventive care refers to lifestyle choices and visits with your health care provider that can promote health and wellness. What does preventive care include?  A yearly physical exam. This is also called an annual well check.  Dental exams once or twice a year.  Routine eye exams. Ask your health care provider how often you should have your eyes checked.  Personal lifestyle choices, including:  Daily care of your teeth and gums.  Regular physical activity.  Eating a healthy diet.  Avoiding tobacco and drug use.  Limiting alcohol use.  Practicing safe sex.  Taking low-dose aspirin every day.  Taking vitamin and mineral supplements as recommended by your health care provider. What happens during an annual well check? The services and screenings done by your health care provider during your annual well check will depend on your age, overall health, lifestyle risk factors, and family history of disease. Counseling  Your health care provider may ask you questions about your:  Alcohol use.  Tobacco use.  Drug use.  Emotional  well-being.  Home and relationship well-being.  Sexual activity.  Eating habits.  History of falls.  Memory and ability to understand (cognition).  Work and work Statistician.  Reproductive health. Screening  You may have the following tests or measurements:  Height, weight, and BMI.  Blood pressure.  Lipid and cholesterol levels. These may be checked every 5 years, or more frequently if you are over 9 years old.  Skin check.  Lung cancer screening. You may have this screening every year starting at age 59 if you have a 30-pack-year history of smoking and currently smoke or have quit within the past 15 years.  Fecal occult blood test (FOBT) of the stool. You may have this test every year starting at age 21.  Flexible sigmoidoscopy or colonoscopy. You may have a sigmoidoscopy every 5 years or a colonoscopy every 10 years starting at age 39.  Hepatitis C blood test.  Hepatitis B blood test.  Sexually transmitted disease (STD) testing.  Diabetes screening. This is done by checking your blood sugar (glucose) after you have not eaten for a while (fasting). You may have this done every 1-3 years.  Bone density scan. This is done to screen for osteoporosis. You may have this done starting at age 68.  Mammogram. This may be done every 1-2 years. Talk to your health care provider about how often you should have regular mammograms. Talk with your health care provider about your test results, treatment options, and if necessary, the need for more tests. Vaccines  Your health care provider may recommend certain vaccines, such as:  Influenza vaccine. This is recommended every year.  Tetanus, diphtheria, and acellular pertussis (Tdap, Td) vaccine.  You may need a Td booster every 10 years.  Zoster vaccine. You may need this after age 22.  Pneumococcal 13-valent conjugate (PCV13) vaccine. One dose is recommended after age 76.  Pneumococcal polysaccharide (PPSV23) vaccine. One  dose is recommended after age 69. Talk to your health care provider about which screenings and vaccines you need and how often you need them. This information is not intended to replace advice given to you by your health care provider. Make sure you discuss any questions you have with your health care provider. Document Released: 04/10/2015 Document Revised: 12/02/2015 Document Reviewed: 01/13/2015 Elsevier Interactive Patient Education  2017 [place] Prevention in the Home Falls can cause injuries. They can happen to people of all ages. There are many things you can do to make your home safe and to help prevent falls. What can I do on the outside of my home?  Regularly fix the edges of walkways and driveways and fix any cracks.  Remove anything that might make you trip as you walk through a door, such as a raised step or threshold.  Trim any bushes or trees on the path to your home.  Use bright outdoor lighting.  Clear any walking paths of anything that might make someone trip, such as rocks or tools.  Regularly check to see if handrails are loose or broken. Make sure that both sides of any steps have handrails.  Any raised decks and porches should have guardrails on the edges.  Have any leaves, snow, or ice cleared regularly.  Use sand or salt on walking paths during winter.  Clean up any spills in your garage right away. This includes oil or grease spills. What can I do in the bathroom?  Use night lights.  Install grab bars by the toilet and in the tub and shower. Do not use towel bars as grab bars.  Use non-skid mats or decals in the tub or shower.  If you need to sit down in the shower, use a plastic, non-slip stool.  Keep the floor dry. Clean up any water that spills on the floor as soon as it happens.  Remove soap buildup in the tub or shower regularly.  Attach bath mats securely with double-sided non-slip rug tape.  Do not have throw rugs and other  things on the floor that can make you trip. What can I do in the bedroom?  Use night lights.  Make sure that you have a light by your bed that is easy to reach.  Do not use any sheets or blankets that are too big for your bed. They should not hang down onto the floor.  Have a firm chair that has side arms. You can use this for support while you get dressed.  Do not have throw rugs and other things on the floor that can make you trip. What can I do in the kitchen?  Clean up any spills right away.  Avoid walking on wet floors.  Keep items that you use a lot in easy-to-reach places.  If you need to reach something above you, use a strong step stool that has a grab bar.  Keep electrical cords out of the way.  Do not use floor polish or wax that makes floors slippery. If you must use wax, use non-skid floor wax.  Do not have throw rugs and other things on the floor that can make you trip. What can I do with my stairs?  Do not leave any  items on the stairs.  Make sure that there are handrails on both sides of the stairs and use them. Fix handrails that are broken or loose. Make sure that handrails are as long as the stairways.  Check any carpeting to make sure that it is firmly attached to the stairs. Fix any carpet that is loose or worn.  Avoid having throw rugs at the top or bottom of the stairs. If you do have throw rugs, attach them to the floor with carpet tape.  Make sure that you have a light switch at the top of the stairs and the bottom of the stairs. If you do not have them, ask someone to add them for you. What else can I do to help prevent falls?  Wear shoes that:  Do not have high heels.  Have rubber bottoms.  Are comfortable and fit you well.  Are closed at the toe. Do not wear sandals.  If you use a stepladder:  Make sure that it is fully opened. Do not climb a closed stepladder.  Make sure that both sides of the stepladder are locked into place.  Ask  someone to hold it for you, if possible.  Clearly mark and make sure that you can see:  Any grab bars or handrails.  First and last steps.  Where the edge of each step is.  Use tools that help you move around (mobility aids) if they are needed. These include:  Canes.  Walkers.  Scooters.  Crutches.  Turn on the lights when you go into a dark area. Replace any light bulbs as soon as they burn out.  Set up your furniture so you have a clear path. Avoid moving your furniture around.  If any of your floors are uneven, fix them.  If there are any pets around you, be aware of where they are.  Review your medicines with your doctor. Some medicines can make you feel dizzy. This can increase your chance of falling. Ask your doctor what other things that you can do to help prevent falls. This information is not intended to replace advice given to you by your health care provider. Make sure you discuss any questions you have with your health care provider. Document Released: 01/08/2009 Document Revised: 08/20/2015 Document Reviewed: 04/18/2014 Elsevier Interactive Patient Education  2017 [employer].

## 2017-12-29 ENCOUNTER — Encounter (HOSPITAL_COMMUNITY)
Admission: RE | Admit: 2017-12-29 | Discharge: 2017-12-29 | Disposition: A | Discharge status: Home / Self Care | Payer: Medicare Other | Source: Skilled Nursing Facility | Attending: Internal Medicine | Admitting: Internal Medicine

## 2017-12-29 DIAGNOSIS — G629 Polyneuropathy, unspecified: Secondary | ICD-10-CM | POA: Diagnosis present

## 2017-12-29 DIAGNOSIS — E119 Type 2 diabetes mellitus without complications: Secondary | ICD-10-CM | POA: Insufficient documentation

## 2017-12-29 DIAGNOSIS — D649 Anemia, unspecified: Secondary | ICD-10-CM | POA: Diagnosis not present

## 2017-12-29 DIAGNOSIS — G4700 Insomnia, unspecified: Secondary | ICD-10-CM | POA: Insufficient documentation

## 2017-12-29 DIAGNOSIS — I10 Essential (primary) hypertension: Secondary | ICD-10-CM | POA: Diagnosis present

## 2017-12-29 LAB — BASIC METABOLIC PANEL
Anion gap: 9 (ref 5–15)
BUN: 20 mg/dL (ref 8–23)
CHLORIDE: 101 mmol/L (ref 98–111)
CO2: 32 mmol/L (ref 22–32)
CREATININE: 1.29 mg/dL — AB (ref 0.44–1.00)
Calcium: 9.1 mg/dL (ref 8.9–10.3)
GFR calc Af Amer: 40 mL/min — ABNORMAL LOW (ref >60)
GFR calc non Af Amer: 35 mL/min — ABNORMAL LOW (ref >60)
Glucose, Bld: 119 mg/dL — ABNORMAL HIGH (ref 70–99)
POTASSIUM: 4 mmol/L (ref 3.5–5.1)
Sodium: 142 mmol/L (ref 135–145)

## 2017-12-29 LAB — CBC WITH DIFFERENTIAL/PLATELET
Basophils Absolute: 0 10*3/uL (ref 0.0–0.1)
Basophils Relative: 0 %
Eosinophils Absolute: 0.2 10*3/uL (ref 0.0–0.7)
Eosinophils Relative: 3 %
HEMATOCRIT: 41 % (ref 36.0–46.0)
HEMOGLOBIN: 12.4 g/dL (ref 12.0–15.0)
LYMPHS PCT: 32 %
Lymphs Abs: 1.4 10*3/uL (ref 0.7–4.0)
MCH: 26 pg (ref 26.0–34.0)
MCHC: 30.2 g/dL (ref 30.0–36.0)
MCV: 86 fL (ref 78.0–100.0)
MONOS PCT: 10 %
Monocytes Absolute: 0.5 10*3/uL (ref 0.1–1.0)
NEUTROS PCT: 55 %
Neutro Abs: 2.5 10*3/uL (ref 1.7–7.7)
Platelets: 221 10*3/uL (ref 150–400)
RBC: 4.77 MIL/uL (ref 3.87–5.11)
RDW: 14.1 % (ref 11.5–15.5)
WBC: 4.5 10*3/uL (ref 4.0–10.5)

## 2017-12-30 LAB — MICROALBUMIN, URINE: MICROALB UR: 19.7 ug/mL — AB

## 2017-12-30 LAB — HEMOGLOBIN A1C
Hgb A1c MFr Bld: 6.5 % — ABNORMAL HIGH (ref 4.8–5.6)
MEAN PLASMA GLUCOSE: 140 mg/dL

## 2017-12-30 LAB — VITAMIN D 25 HYDROXY (VIT D DEFICIENCY, FRACTURES): Vit D, 25-Hydroxy: 30.3 ng/mL (ref 30.0–100.0)

## 2018-01-24 ENCOUNTER — Non-Acute Institutional Stay (SKILLED_NURSING_FACILITY): Payer: Medicare Other | Admitting: Internal Medicine

## 2018-01-24 ENCOUNTER — Encounter: Payer: Self-pay | Admitting: Internal Medicine

## 2018-01-24 DIAGNOSIS — R600 Localized edema: Secondary | ICD-10-CM | POA: Diagnosis not present

## 2018-01-24 DIAGNOSIS — R634 Abnormal weight loss: Secondary | ICD-10-CM | POA: Diagnosis not present

## 2018-01-24 DIAGNOSIS — F0280 Dementia in other diseases classified elsewhere without behavioral disturbance: Secondary | ICD-10-CM

## 2018-01-24 DIAGNOSIS — I10 Essential (primary) hypertension: Secondary | ICD-10-CM

## 2018-01-24 DIAGNOSIS — E1151 Type 2 diabetes mellitus with diabetic peripheral angiopathy without gangrene: Secondary | ICD-10-CM | POA: Diagnosis not present

## 2018-01-24 DIAGNOSIS — K219 Gastro-esophageal reflux disease without esophagitis: Secondary | ICD-10-CM

## 2018-01-24 DIAGNOSIS — G309 Alzheimer's disease, unspecified: Secondary | ICD-10-CM | POA: Diagnosis not present

## 2018-01-24 DIAGNOSIS — E038 Other specified hypothyroidism: Secondary | ICD-10-CM

## 2018-01-24 NOTE — Progress Notes (Signed)
This is a routine visit.  Level of care is skilled.  Facility is [facility].  Chief complaint routine visit for medical management of chronic medical conditions including dementia-type 2 diabetes-lower extremity edema-hypothyroidism- as well as peripheral vascular disease- GERD depression and hypertension.  History of present illness.  Patient is a pleasant 84 year old female with the above diagnoses she appears to be having a period of stability at one point had some weight loss but appears to be gaining this back current weight is 210 pounds which is comparable to what her weight was this past spring.  Her weight did drop somewhat later in the summer-but appears again to be gaining this back- she still reports a spotty appetite but apparently is eating somewhat better.  She did have a CT of the abdomen and chest that did not show any acute process and her daughter did not want any aggressive work-up for the weight loss which again appears to have reversed itself  She does have a history of edema and she is on Lasix as well as metolazone which she is on 3 days a week.  Her edema appears to be at baseline I would classify this is moderate with venous stasis changes.   She is not complaining of any chest pain fever chills or shortness of breath.  She continues to be vocal -- in good spirits.  At one point she was somewhat subdued appearing which was very much and like her she was started on Lexapro and did respond very well with this I suspect she would not do well with a dose reduction  In regards to dementia she continues on Aricept and continues to do well with supportive care.  She does have a history of hypothyroidism TSH is have been within normal limits she is on Synthroid.  She also has a history of peripheral vascular disease which is been stable on aspirin statin and Pletal.  She also has a history of hypertension which appears stable I got a manual blood pressure of 124/72  today- at one point she had been on lisinopril but this was discontinued because of concerns for hypotension.  She does have a diagnosis type 2 diabetes she is on Glucophage once a day in the morning- blood sugars appear to be mainly in the lower 100s hemoglobin A1c was 6.5 on lab done earlier this month  Currently she is sitting in her wheelchair comfortably is pleasant laughing quite talkative which is her baseline.  Past Medical History:  Diagnosis Date  . Acute cystitis   . Dementia ([place])   . Dermatomycosis, unspecified   . DJD (degenerative joint disease)    Spine  . DM (diabetes mellitus) ([place])   . GERD (gastroesophageal reflux disease)   . History of pulmonary embolism   . History of supraventricular tachycardia   . Hyperlipidemia   . Hypertension   . Hypothyroidism   . Long term (current) use of anticoagulants    coumadin  . Obesity   . Other malaise and fatigue   . PVD (peripheral vascular disease) ([place])         Past Surgical History:  Procedure Laterality Date  . ATRIAL ABLATION SURGERY  01/2003   Catheter, to tachic palpitations  . ESOPHAGOGASTRODUODENOSCOPY N/A 10/04/2017   Procedure: ESOPHAGOGASTRODUODENOSCOPY (EGD);  Surgeon: Rogene Houston, MD;  Location: [facility];  Service: Endoscopy;  Laterality: N/A;  2:50  . MASTECTOMY  2003   Left  . THYROIDECTOMY, PARTIAL  1962  Allergies  Allergen Reactions  . Contrast Media [Iodinated Diagnostic Agents] Other (See Comments)    Unknown-listed on MAR-patient is not aware of these allergies  . Latex Other (See Comments)    Unknown-listed on MAR-patient is not aware of these allergies        Outpatient Encounter Medications as of 12/28/2017  Medication Sig  . acetaminophen (TYLENOL) 325 MG tablet Take 650 mg by mouth 2 (two) times daily.  Marland Kitchen aspirin 81 MG chewable tablet Chew 81 mg by mouth daily.  Roseanne Kaufman Peru-Castor Oil (VENELEX) OINT Apply 1 application topically See  admin instructions. Apply to sacrum and bilateral buttocks each shift and prn for prevention   . cilostazol (PLETAL) 50 MG tablet Take 50 mg by mouth 2 (two) times daily.    Marland Kitchen donepezil (ARICEPT) 10 MG tablet Take 10 mg by mouth every evening.   . escitalopram (LEXAPRO) 5 MG tablet Take 5 mg by mouth at bedtime.   . furosemide (LASIX) 40 MG tablet Take 40 mg by mouth daily. Hold for systolic below BP below 742  . gabapentin (NEURONTIN) 300 MG capsule Take 300 mg by mouth 2 (two) times daily.    Marland Kitchen levothyroxine (SYNTHROID, LEVOTHROID) 50 MCG tablet Take 50 mcg by mouth daily.    . memantine (NAMENDA) 10 MG tablet Take 10 mg by mouth 2 (two) times daily.   . metFORMIN (GLUCOPHAGE) 500 MG tablet Take 500 mg by mouth daily with breakfast.   . metolazone (ZAROXOLYN) 2.5 MG tablet Take 2.5 mg by mouth every Monday, Wednesday, and Friday.   . mirtazapine (REMERON) 15 MG tablet Take 7.5 mg by mouth at bedtime.  . Multiple Vitamins-Minerals (MULTIVITAMIN WITH MINERALS) tablet Take 1 tablet by mouth daily.  . pravastatin (PRAVACHOL) 80 MG tablet Take 80 mg by mouth at bedtime.    . ranitidine (ZANTAC) 150 MG capsule Take 1 capsule (150 mg total) by mouth 2 (two) times daily.   No facility-administered encounter medications on file as of 12/28/2017.     Review of Systems  This is quite limited secondary to dementia but she is not complaining of any pain fever chills shortness of breath-nursing does not report any recent acute issues   Immunization History  Administered Date(s) Administered  . Influenza Whole 10/11/2006  . Influenza-Unspecified 03/19/2009, 12/30/2016  . Pneumococcal Conjugate-13 12/30/2016  . Td 01/14/2003  . Tdap 12/15/2016       Pertinent  Health Maintenance Due  Topic Date Due  . INFLUENZA VACCINE  10/26/2017  . URINE MICROALBUMIN  01/28/2018 (Originally 10/21/2017)  . PNA vac Low Risk Adult (2 of 2 - PPSV23) 12/30/2017  . OPHTHALMOLOGY EXAM  01/17/2018  . HEMOGLOBIN  A1C  03/14/2018  . FOOT EXAM  09/26/2018  . DEXA SCAN  Completed   Fall Risk  12/28/2017 12/15/2016  Falls in the past year? No No   Functional Status Survey:      Physical exam.  She is afebrile pulse of 80 respirations of 20 blood pressure 124/72 weight is 210 pounds.  In general this is a well-nourished elderly female in no distress she looks younger than her stated age.  Her skin is warm and dry.  Eyes visual acuity appears to be intact sclera and conjunctive are clear.  Oropharynx is clear mucous membranes moist.  Chest is clear to auscultation there is no labored breathing.  Heart is regular rate and rhythm without murmur gallop or rub she has mild lower extremity edema with venous stasis changes.  Abdomen is soft somewhat obese nontender with positive bowel sounds.  Neurologic is grossly intact her speech is clear no lateralizing findings.  Psych she is oriented to self is pleasant jovial and talkative which is her baseline.  Labs.  December 29, 2017.  Hemoglobin A1c 6.5.  Vitamin D level 30.5.  Sodium 142 potassium 4.0 BUN 20 creatinine 1.29.  WBC 4.5 hemoglobin 12.4 platelets 221.  September 13, 2017-TSH 0.921.  Assessment and plan.  1.  Dementia she appears to be doing well with supportive care is gaining back her weight- this does not really appear to be edema related-she is on Aricept and Namenda as well as Remeron-.  2.  History of type 2 diabetes this is stable on once a day Glucophage-CBGs as noted above largely in the lower 100s hemoglobin A1c was stable at 6.5.  3.  History of lower extremity edema venous stasis-this appears baseline she continues on Lasix 40 mg a day as well as metolazone 2.5 mg 3 days a week.  Recent metabolic panel showed stability of renal function and electrolytes.  4.  History of chronic kidney disease-this appears stable with a creatinine of 1.29 BUN of 20 on lab done earlier this month.  5.-  Hypothyroidism this is stable  as well on Synthroid TSH in June was within normal limits.  6.-  History of peripheral vascular disease this is been largely asymptomatic she has not had any recent wounds-she is on aspirin as well as Pletal and a statin.  7.  History of hypertension she is only on diuretics as noted above she was on an ACE inhibitor but this was discontinued because of hypotension concerns blood pressure appears to be stable-I got a manual reading of 124/72.  8.-  Depression this appears stabilized on low-dose Lexapro she is also on Remeron- would be hesitant to do any dose adjustments and secondary to stability and a positive reaction to the Lexapro   #9-history of GERD with esophagitis this is stable on twice daily ranitidine.--I do not believe this contributed to her weight loss  10.  History of neuropathy most likely diabetic related this appears stable currently on Neurontin twice a day.  11.  History of osteoarthritic pain this is stable on Tylenol has not really required anything stronger.  [id number]

## 2018-05-01 ENCOUNTER — Encounter (HOSPITAL_COMMUNITY)
Admission: RE | Admit: 2018-05-01 | Discharge: 2018-05-01 | Disposition: A | Discharge status: Home / Self Care | Payer: Medicare Other | Source: Skilled Nursing Facility | Attending: Internal Medicine | Admitting: Internal Medicine

## 2018-05-01 ENCOUNTER — Encounter: Payer: Self-pay | Admitting: Internal Medicine

## 2018-05-01 ENCOUNTER — Non-Acute Institutional Stay (SKILLED_NURSING_FACILITY): Payer: Medicare Other | Admitting: Internal Medicine

## 2018-05-01 DIAGNOSIS — I10 Essential (primary) hypertension: Secondary | ICD-10-CM | POA: Insufficient documentation

## 2018-05-01 DIAGNOSIS — F0280 Dementia in other diseases classified elsewhere without behavioral disturbance: Secondary | ICD-10-CM

## 2018-05-01 DIAGNOSIS — G629 Polyneuropathy, unspecified: Secondary | ICD-10-CM | POA: Diagnosis present

## 2018-05-01 DIAGNOSIS — F329 Major depressive disorder, single episode, unspecified: Secondary | ICD-10-CM | POA: Insufficient documentation

## 2018-05-01 DIAGNOSIS — R635 Abnormal weight gain: Secondary | ICD-10-CM

## 2018-05-01 DIAGNOSIS — G309 Alzheimer's disease, unspecified: Secondary | ICD-10-CM | POA: Diagnosis not present

## 2018-05-01 DIAGNOSIS — I739 Peripheral vascular disease, unspecified: Secondary | ICD-10-CM

## 2018-05-01 DIAGNOSIS — E1151 Type 2 diabetes mellitus with diabetic peripheral angiopathy without gangrene: Secondary | ICD-10-CM

## 2018-05-01 DIAGNOSIS — R600 Localized edema: Secondary | ICD-10-CM

## 2018-05-01 LAB — BASIC METABOLIC PANEL
Anion gap: 10 (ref 5–15)
BUN: 30 mg/dL — AB (ref 8–23)
CO2: 29 mmol/L (ref 22–32)
Calcium: 8.2 mg/dL — ABNORMAL LOW (ref 8.9–10.3)
Chloride: 102 mmol/L (ref 98–111)
Creatinine, Ser: 1.59 mg/dL — ABNORMAL HIGH (ref 0.44–1.00)
GFR calc Af Amer: 32 mL/min — ABNORMAL LOW (ref >60)
GFR calc non Af Amer: 28 mL/min — ABNORMAL LOW (ref >60)
Glucose, Bld: 129 mg/dL — ABNORMAL HIGH (ref 70–99)
POTASSIUM: 3.6 mmol/L (ref 3.5–5.1)
Sodium: 141 mmol/L (ref 135–145)

## 2018-05-01 NOTE — Progress Notes (Signed)
Location:    [facility] Number: 149/W Place of Service:  SNF [phone]) Provider:  Tera Helper, MD  Patient Care Team: Fayrene Helper, MD as PCP - General  Extended Emergency Contact Information Primary Emergency Contact: Valarie Merino Address: [address], [place] Phone: [phone] Work Phone: [phone] Relation: Daughter  Code Status:  DNR Goals of care: Advanced Directive information Advanced Directives 05/01/2018  Does Patient Have a Medical Advance Directive? Yes  Type of Advance Directive Out of facility DNR (pink MOST or yellow form)  Does patient want to make changes to medical advance directive? No - Patient declined  Copy of [place] in Chart? -     Chief Complaint  Patient presents with  . Acute Visit    Weight Gain  Also medical management of chronic medical conditions including dementia-type 2 diabetes-lower extremity edema-hypothyroidism as well as peripheral vascular disease GERD depression and hypertension  HPI:  Pt is a 85 y.o. female seen today for an acute visit for chronic medical conditions as noted above as well as for weight gain.  It appears patient has gained about 10 pounds since December appears this is been somewhat gradual actually appears she is lost about 3 pounds since late January but trend has been going up.    back in mid December her weight was around 215 pounds.  Apparently she does snack a lot and at one point actually had significant weight loss and a CT of her abdomen and pelvis was ordered which did not really show anything acute-her daughter did not wish aggressive work-up and again her weight now has trended up.  Patient herself is not complaining of any chest pain or shortness of breath continues to have what appears to be her baseline venous stasis edema lower extremities.  She does continue on Lasix  40 mg a day.  She is also on metolazone 2.5 mg 3 times a week.  Regards to her other diagnoses she appears to be doing well.  She continues on Aricept and Namenda with her history of dementia she continues to be quite vocal which is her baseline--she is alert and continues to be pleasant.  She also has a history of peripheral vascular disease she is on aspirin as well as Pletal and a statin.  In regards to depression at one point this was thought to be a significant issue she was quiet and reserved which is unlike her she appears to have responded to Lexapro.  She was also on Remeron I believe this was started for appetite stimulation.  Regards to hypertension she is not on any medication other than Lasix because of low blood pressure readings at times affect lisinopril was discontinued secondary to this.  She also is a type II diabetic on low-dose Glucophage CBGs are largely in the lower mid 100s hemoglobin A1c was 6.5 in October.  Currently she is sitting in her wheelchair comfortably has no acute complaints is bright alert talkative at baseline  I do note she did have a routine lab done today which shows her creatinine is up slightly from her baseline at 1.59 baseline appears to be more around 1.35 BUN is mildly elevated at 30.     Past Medical History:  Diagnosis Date  . Acute cystitis   . Dementia ([place])   . Dermatomycosis, unspecified   . DJD (degenerative joint  disease)    Spine  . DM (diabetes mellitus) ([place])   . GERD (gastroesophageal reflux disease)   . History of pulmonary embolism   . History of supraventricular tachycardia   . Hyperlipidemia   . Hypertension   . Hypothyroidism   . Long term (current) use of anticoagulants    coumadin  . Obesity   . Other malaise and fatigue   . PVD (peripheral vascular disease) ([place])    Past Surgical History:  Procedure Laterality Date  . ATRIAL ABLATION SURGERY  01/2003   Catheter, to tachic palpitations  .  ESOPHAGOGASTRODUODENOSCOPY N/A 10/01/2017   Procedure: ESOPHAGOGASTRODUODENOSCOPY (EGD);  Surgeon: Rogene Houston, MD;  [facility];  Service: Endoscopy;  Laterality: N/A;  2:50  . MASTECTOMY  2003   Left  . THYROIDECTOMY, PARTIAL  1962    Allergies  Allergen Reactions  . Contrast Media [Iodinated Diagnostic Agents] Other (See Comments)    Unknown-listed on MAR-patient is not aware of these allergies  . Latex Other (See Comments)    Unknown-listed on MAR-patient is not aware of these allergies    Outpatient Encounter Medications as of 05/01/2018  Medication Sig  . acetaminophen (TYLENOL) 325 MG tablet Take 650 mg by mouth 2 (two) times daily.  Marland Kitchen aspirin 81 MG chewable tablet Chew 81 mg by mouth daily.  Roseanne Kaufman Peru-Castor Oil (VENELEX) OINT Apply 1 application topically See admin instructions. Apply to sacrum and bilateral buttocks each shift and prn for prevention   . cilostazol (PLETAL) 50 MG tablet Take 50 mg by mouth 2 (two) times daily.    Marland Kitchen donepezil (ARICEPT) 10 MG tablet Take 10 mg by mouth daily.   Marland Kitchen escitalopram (LEXAPRO) 5 MG tablet Take 5 mg by mouth at bedtime.   . furosemide (LASIX) 40 MG tablet Take 40 mg by mouth daily. Hold for systolic below BP below 539  . gabapentin (NEURONTIN) 300 MG capsule Take 300 mg by mouth 2 (two) times daily.    Marland Kitchen levothyroxine (SYNTHROID, LEVOTHROID) 50 MCG tablet Take 50 mcg by mouth daily.    . memantine (NAMENDA) 10 MG tablet Take 10 mg by mouth 2 (two) times daily.   . metFORMIN (GLUCOPHAGE) 500 MG tablet Take 500 mg by mouth daily with breakfast.   . metolazone (ZAROXOLYN) 2.5 MG tablet Take 2.5 mg by mouth every Monday, Wednesday, and Friday.   . mirtazapine (REMERON) 15 MG tablet Take 7.5 mg by mouth at bedtime.  . Multiple Vitamins-Minerals (MULTIVITAMIN WITH MINERALS) tablet Take 1 tablet by mouth daily.  . pravastatin (PRAVACHOL) 80 MG tablet Take 80 mg by mouth at bedtime.    . ranitidine (ZANTAC) 150 MG capsule Take  1 capsule (150 mg total) by mouth 2 (two) times daily.   No facility-administered encounter medications on file as of 05/01/2018.     Review of Systems --this is limited somewhat secondary to dementia  In general she is not complaining of any fever chills   skin does not complain of rashes or itching.  Head ears eyes nose mouth and throat is not complain of visual changes or sore throat.  Respiratory denies shortness of breath or cough.  Cardiac denies chest pain has venous stasis edema.  GI is not complaining of abdominal pain nausea vomiting diarrhea constipation per staff appetite remains generally at baseline.  GU does not complain of dysuria.  Musculoskeletal is not complaining of joint pain currently.  Neurologic does not complain of dizziness headache does have some complaints  at times of numbness with suspected neuropathy she continues on Neurontin.  And psych does not complain of being depressed or anxious appears to be her usual talkative self    Immunization History  Administered Date(s) Administered  . Influenza Whole 10/11/2006  . Influenza-Unspecified 03/19/2009, 12/30/2016  . Pneumococcal Conjugate-13 12/30/2016  . Td 01/14/2003  . Tdap 12/15/2016   Pertinent  Health Maintenance Due  Topic Date Due  . INFLUENZA VACCINE  05/28/2018 (Originally 10/26/2017)  . PNA vac Low Risk Adult (2 of 2 - PPSV23) 05/02/2019 (Originally 12/30/2017)  . HEMOGLOBIN A1C  06/30/2018  . FOOT EXAM  09/26/2018  . URINE MICROALBUMIN  12/30/2018  . OPHTHALMOLOGY EXAM  01/31/2019  . DEXA SCAN  Completed   Fall Risk  12/28/2017 12/15/2016  Falls in the past year? No No   Functional Status Survey:    Vitals:   05/01/18 1255  BP: 107/70  Pulse: 93  Resp: (!) 22  Temp: 98.4 F (36.9 C)  TempSrc: Oral   Weight is 225 pounds Physical Exam  In general this is a pleasant elderly female who appears to be at her baseline.  Her skin is warm and dry.   Eyes visual acuity appears  to be intact sclera and conjunctive are clear  Oropharynx is clear mucous membranes moist.  Chest is clear to auscultation there is no labored breathing.  Heart is regular rate and rhythm pulse 100-she continues to have I would say 2+ stasis edema--appears relatively baseline   Abdomen is obese soft nontender with positive bowel sounds.  Musculoskeletal continues to ambulate in her wheelchair she does well with this moves all extremities at baseline with baseline strength.  Neurologic is grossly intact her speech is clear could not really appreciate lateralizing findings.  Psych she is oriented to self follow simple verbal commands without difficulty continues to be her usual boisterous vocal self.     Labs reviewed: Recent Labs    10/24/17 0700 12/29/17 0700 05/01/18 0700  NA 140 142 141  K 3.9 4.0 3.6  CL 98 101 102  CO2 33* 32 29  GLUCOSE 115* 119* 129*  BUN 18 20 30*  CREATININE 1.30* 1.29* 1.59*  CALCIUM 9.2 9.1 8.2*   No results for input(s): AST, ALT, ALKPHOS, BILITOT, PROT, ALBUMIN in the last 8760 hours. Recent Labs    07/16/17 0930 09/11/17 0719 12/29/17 0700  WBC 4.0 4.7 4.5  NEUTROABS  --  2.5 2.5  HGB 12.2 12.6 12.4  HCT 41.8 42.1 41.0  MCV 80.2 82.5 86.0  PLT 195 183 221   Lab Results  Component Value Date   TSH 0.921 09/11/2017   Lab Results  Component Value Date   HGBA1C 6.5 (H) 12/29/2017   Lab Results  Component Value Date   CHOL 150 11/26/2016   HDL 45 11/26/2016   LDLCALC 85 11/26/2016   TRIG 99 11/26/2016   CHOLHDL 3.3 11/26/2016    Significant Diagnostic Results in last 30 days:  No results found.  Assessment/Plan  #1- history of weight gain- this could be somewhat appetite related will DC Remeron--this is complicated with what appears to be some renal insufficiency- at this point she is on Lasix 40 mg a day does not complain of shortness of breath or chest pain-at this point will continue to monitor I suspect her appetite  has something to do with the weight gain.  2- history of renal insufficiency-creatinine is up slightly from her baseline at 1.59- at this point will  monitor.  3.  #3- history of type 2 diabetes this appears well controlled on low-dose Glucophage--CBGs largely in the lower mid 100s  4.-  History of hypothyroidism TSH has been within normal limits it was 0.921 in September She is on Synthroid.  5.-  History of peripheral vascular disease this is been stable on aspirin as well as statin and Pletal  #6- history of dementia this is stable on Aricept and Namenda continues to be her usual boisterous self    #7 history of depression this is going quite stable on Lexapro again will DC Remeron  #8 hypertension again she is only on Lasix secondary to hypotension with lisinopril this appears to be stable.  9.-  History of osteoarthritis this appears well controlled on Tylenol she is also on Neurontin for suspected neuropathy.  10.  History of GERD this appears to be stabilized on ranitidine twice daily--  [phone]

## 2018-05-14 ENCOUNTER — Non-Acute Institutional Stay (SKILLED_NURSING_FACILITY): Payer: Medicare Other | Admitting: Internal Medicine

## 2018-05-14 ENCOUNTER — Encounter: Payer: Self-pay | Admitting: Internal Medicine

## 2018-05-14 DIAGNOSIS — R600 Localized edema: Secondary | ICD-10-CM | POA: Diagnosis not present

## 2018-05-14 DIAGNOSIS — I10 Essential (primary) hypertension: Secondary | ICD-10-CM | POA: Diagnosis not present

## 2018-05-14 DIAGNOSIS — E038 Other specified hypothyroidism: Secondary | ICD-10-CM

## 2018-05-14 DIAGNOSIS — E1151 Type 2 diabetes mellitus with diabetic peripheral angiopathy without gangrene: Secondary | ICD-10-CM | POA: Diagnosis not present

## 2018-05-14 NOTE — Progress Notes (Signed)
Location:  [place] Room Number: [place] of Service:  SNF [phone]) Provider:  Veleta Miners, MD  Fayrene Helper, MD  Patient Care Team: Fayrene Helper, MD as PCP - General  Extended Emergency Contact Information Primary Emergency Contact: Valarie Merino Address: [address], [place] Phone: [phone] Work Phone: [phone] Relation: Daughter  Code Status:  DNR Goals of care: Advanced Directive information Advanced Directives 05/01/2018  Does Patient Have a Medical Advance Directive? Yes  Type of Advance Directive Out of facility DNR (pink MOST or yellow form)  Does patient want to make changes to medical advance directive? No - Patient declined  Copy of [place] in Chart? -     Chief Complaint  Patient presents with  . Medical Management of Chronic Issues    Medical Management of Dementia, DM, HTN, Hyperlipidemia, Hypothyroidism, PVD    HPI:  Pt is a 85 y.o. female seen today for medical management of chronic diseases.    Patient has H/O Dementia, Diabetes Type 2, LE Edema,Hypothyroidism, Peripheral vascular disease and Chronic Venous stasis, Hyperlipidemia. GERD with Esophagitis.and Depression.  Patient is Long term Geographical information systems officer. Patient had weight loss in the past and now she is gaining weight and has gained almost 15 lbs since my last visit. She denies any cough, SOB or Chest Pian. Has LE swelling which is Chronic. Her Appetite is good. No Acute issues per Nurses. Patient is mostly Psychologist, sport and exercise Bound.   Past Medical History:  Diagnosis Date  . Acute cystitis   . Dementia ([place])   . Dermatomycosis, unspecified   . DJD (degenerative joint disease)    Spine  . DM (diabetes mellitus) ([place])   . GERD (gastroesophageal reflux disease)   . History of pulmonary embolism   . History of supraventricular tachycardia   . Hyperlipidemia   .  Hypertension   . Hypothyroidism   . Long term (current) use of anticoagulants    coumadin  . Obesity   . Other malaise and fatigue   . PVD (peripheral vascular disease) ([place])    Past Surgical History:  Procedure Laterality Date  . ATRIAL ABLATION SURGERY  01/2003   Catheter, to tachic palpitations  . ESOPHAGOGASTRODUODENOSCOPY N/A 10/04/2017   Procedure: ESOPHAGOGASTRODUODENOSCOPY (EGD);  Surgeon: Rogene Houston, MD;  Location[facility];  Service: Endoscopy;  Laterality: N/A;  2:50  . MASTECTOMY  2003   Left  . THYROIDECTOMY, PARTIAL  1962    Allergies  Allergen Reactions  . Contrast Media [Iodinated Diagnostic Agents] Other (See Comments)    Unknown-listed on MAR-patient is not aware of these allergies  . Latex Other (See Comments)    Unknown-listed on MAR-patient is not aware of these allergies    Outpatient Encounter Medications as of 05/14/2018  Medication Sig  . acetaminophen (TYLENOL) 325 MG tablet Take 650 mg by mouth 2 (two) times daily.  Marland Kitchen aspirin 81 MG chewable tablet Chew 81 mg by mouth daily.  Roseanne Kaufman Peru-Castor Oil (VENELEX) OINT Apply 1 application topically See admin instructions. Apply to sacrum and bilateral buttocks each shift and prn for prevention   . cilostazol (PLETAL) 50 MG tablet Take 50 mg by mouth 2 (two) times daily.    Marland Kitchen donepezil (ARICEPT) 10 MG tablet Take 10 mg by mouth daily.   Marland Kitchen escitalopram (LEXAPRO) 5 MG tablet Take 5 mg by mouth at bedtime.   Marland Kitchen  furosemide (LASIX) 40 MG tablet Take 40 mg by mouth daily.   Marland Kitchen gabapentin (NEURONTIN) 300 MG capsule Take 300 mg by mouth 2 (two) times daily.    Marland Kitchen levothyroxine (SYNTHROID, LEVOTHROID) 50 MCG tablet Take 50 mcg by mouth daily.    . memantine (NAMENDA) 10 MG tablet Take 10 mg by mouth 2 (two) times daily.   . metFORMIN (GLUCOPHAGE) 500 MG tablet Take 500 mg by mouth daily with breakfast.   . metolazone (ZAROXOLYN) 2.5 MG tablet Take 2.5 mg by mouth every Monday, Wednesday, and Friday.   .  Multiple Vitamins-Minerals (MULTIVITAMIN WITH MINERALS) tablet Take 1 tablet by mouth daily.  . NON FORMULARY Diet Type:  Consistent CHO Diet  . pravastatin (PRAVACHOL) 80 MG tablet Take 80 mg by mouth at bedtime.    . ranitidine (ZANTAC) 150 MG capsule Take 1 capsule (150 mg total) by mouth 2 (two) times daily.  . [DISCONTINUED] mirtazapine (REMERON) 15 MG tablet Take 7.5 mg by mouth at bedtime.   No facility-administered encounter medications on file as of 05/14/2018.     Review of Systems  Unable to perform ROS: Dementia (She always says she is fine)    Immunization History  Administered Date(s) Administered  . Influenza Whole 10/11/2006  . Influenza-Unspecified 03/19/2009, 12/24/2015, 12/30/2016, 12/28/2017  . Pneumococcal Conjugate-13 12/30/2016  . Pneumococcal-Unspecified 12/24/2015, 12/28/2017  . Td 01/14/2003  . Tdap 12/15/2016   Pertinent  Health Maintenance Due  Topic Date Due  . INFLUENZA VACCINE  05/28/2018 (Originally 10/26/2017)  . PNA vac Low Risk Adult (2 of 2 - PPSV23) 05/02/2019 (Originally 12/30/2017)  . HEMOGLOBIN A1C  06/30/2018  . FOOT EXAM  09/26/2018  . URINE MICROALBUMIN  12/30/2018  . OPHTHALMOLOGY EXAM  01/31/2019  . DEXA SCAN  Completed   Fall Risk  12/28/2017 12/15/2016  Falls in the past year? No No   Functional Status Survey:    Vitals:   05/14/18 1550  BP: 109/79  Pulse: (!) 112  Resp: (!) 22  Temp: 98.5 F (36.9 C)  SpO2: 99%  Weight: 228 lb (103.4 kg)  Height: 5\' 9"  (1.753 m)   Body mass index is 33.67 kg/m. Physical Exam Constitutional:      Appearance: She is well-developed.  HENT:     Head: Normocephalic.  Eyes:     Pupils: Pupils are equal, round, and reactive to light.  Neck:     Musculoskeletal: Neck supple.  Cardiovascular:     Rate and Rhythm: Normal rate and regular rhythm.     Heart sounds: No murmur.  Pulmonary:     Effort: Pulmonary effort is normal. No respiratory distress.     Breath sounds: Normal breath  sounds. No stridor.  Abdominal:     General: Bowel sounds are normal. There is no distension.     Palpations: Abdomen is soft.     Tenderness: There is no abdominal tenderness. There is no guarding.  Musculoskeletal:     Comments: Moderate  Edema Bilateral  Skin:    General: Skin is warm and dry.  Neurological:     Mental Status: She is alert.  Psychiatric:        Behavior: Behavior normal.     Labs reviewed: Recent Labs    10/24/17 0700 12/29/17 0700 05/01/18 0700  NA 140 142 141  K 3.9 4.0 3.6  CL 98 101 102  CO2 33* 32 29  GLUCOSE 115* 119* 129*  BUN 18 20 30*  CREATININE 1.30* 1.29* 1.59*  CALCIUM 9.2 9.1 8.2*   No results for input(s): AST, ALT, ALKPHOS, BILITOT, PROT, ALBUMIN in the last 8760 hours. Recent Labs    07/16/17 0930 09/11/17 0719 12/29/17 0700  WBC 4.0 4.7 4.5  NEUTROABS  --  2.5 2.5  HGB 12.2 12.6 12.4  HCT 41.8 42.1 41.0  MCV 80.2 82.5 86.0  PLT 195 183 221   Lab Results  Component Value Date   TSH 0.921 09/11/2017   Lab Results  Component Value Date   HGBA1C 6.5 (H) 12/29/2017   Lab Results  Component Value Date   CHOL 150 11/26/2016   HDL 45 11/26/2016   LDLCALC 85 11/26/2016   TRIG 99 11/26/2016   CHOLHDL 3.3 11/26/2016    Significant Diagnostic Results in last 30 days:  No results found.  Assessment/Plan  Lower extremity edema with Weight Gain Continue on metolazone and Lasix She does not want to wear her Compression Socks. I don't want to increase her Diuretics as she is asymptomatic otherwise But if her weight continues to rise will increase her Lasix to prevent worsening swelling  Diabetes with nephropathy and neuropathy Her blood sugars running More then 150 but less then 200 A1c was6.5 in 10/20  B12 was normal in03/19 Decreased Her Metformin to QDin last visit. Continues to be on Neurontin Essential hypertension BP stable Peripheral vascular disease On aspirin Also on Pletal and  Statin Hypothyroidism TSH normal in06/19 Due for repeat TSH Alzheimer's dementia Stable on Aricept and Namenda Depression Will Continue Lexapro Remeron discontinued due to weight gain Hyperlipidemia Continue pravastatin. LDL 85 in 09/18  GERDWith Esophagitis Per EGD On Ranitidine BID    DEXA scan was Tscore -.1.Marland Kitchen12 in 08/18   Family/ staff Communication:   Labs/tests ordered:  Due for TSH, HBA1C, Vit B 12 level  Total time spent in this patient care encounter was 25_ minutes; greater than 50% of the visit spent counseling patient, reviewing records , Labs and coordinating care for problems addressed at this encounter.

## 2018-05-18 ENCOUNTER — Encounter (HOSPITAL_COMMUNITY)
Admission: RE | Admit: 2018-05-18 | Discharge: 2018-05-18 | Disposition: A | Discharge status: Home / Self Care | Payer: Medicare Other | Source: Skilled Nursing Facility | Attending: Internal Medicine | Admitting: Internal Medicine

## 2018-05-18 DIAGNOSIS — G629 Polyneuropathy, unspecified: Secondary | ICD-10-CM | POA: Insufficient documentation

## 2018-05-18 DIAGNOSIS — I10 Essential (primary) hypertension: Secondary | ICD-10-CM | POA: Insufficient documentation

## 2018-05-18 DIAGNOSIS — G4700 Insomnia, unspecified: Secondary | ICD-10-CM | POA: Insufficient documentation

## 2018-05-18 DIAGNOSIS — F329 Major depressive disorder, single episode, unspecified: Secondary | ICD-10-CM | POA: Diagnosis present

## 2018-05-18 LAB — COMPREHENSIVE METABOLIC PANEL
ALBUMIN: 3.1 g/dL — AB (ref 3.5–5.0)
ALK PHOS: 74 U/L (ref 38–126)
ALT: 14 U/L (ref 0–44)
AST: 23 U/L (ref 15–41)
Anion gap: 9 (ref 5–15)
BILIRUBIN TOTAL: 0.9 mg/dL (ref 0.3–1.2)
BUN: 34 mg/dL — AB (ref 8–23)
CALCIUM: 8.8 mg/dL — AB (ref 8.9–10.3)
CO2: 30 mmol/L (ref 22–32)
CREATININE: 1.65 mg/dL — AB (ref 0.44–1.00)
Chloride: 102 mmol/L (ref 98–111)
GFR calc Af Amer: 30 mL/min — ABNORMAL LOW (ref >60)
GFR, EST NON AFRICAN AMERICAN: 26 mL/min — AB (ref >60)
GLUCOSE: 148 mg/dL — AB (ref 70–99)
Potassium: 4.4 mmol/L (ref 3.5–5.1)
Sodium: 141 mmol/L (ref 135–145)
TOTAL PROTEIN: 7.3 g/dL (ref 6.5–8.1)

## 2018-05-18 LAB — CBC WITH DIFFERENTIAL/PLATELET
Abs Immature Granulocytes: 0.03 10*3/uL (ref 0.00–0.07)
Basophils Absolute: 0 10*3/uL (ref 0.0–0.1)
Basophils Relative: 0 %
Eosinophils Absolute: 0.1 10*3/uL (ref 0.0–0.5)
Eosinophils Relative: 2 %
HEMATOCRIT: 44.4 % (ref 36.0–46.0)
HEMOGLOBIN: 12.6 g/dL (ref 12.0–15.0)
Immature Granulocytes: 1 %
LYMPHS PCT: 19 %
Lymphs Abs: 0.9 10*3/uL (ref 0.7–4.0)
MCH: 24.2 pg — ABNORMAL LOW (ref 26.0–34.0)
MCHC: 28.4 g/dL — ABNORMAL LOW (ref 30.0–36.0)
MCV: 85.4 fL (ref 80.0–100.0)
MONO ABS: 0.7 10*3/uL (ref 0.1–1.0)
Monocytes Relative: 15 %
NEUTROS ABS: 3 10*3/uL (ref 1.7–7.7)
Neutrophils Relative %: 63 %
Platelets: 236 10*3/uL (ref 150–400)
RBC: 5.2 MIL/uL — AB (ref 3.87–5.11)
RDW: 15.2 % (ref 11.5–15.5)
WBC: 4.8 10*3/uL (ref 4.0–10.5)
nRBC: 0 % (ref 0.0–0.2)

## 2018-05-18 LAB — TSH: TSH: 1.544 u[IU]/mL (ref 0.350–4.500)

## 2018-05-18 LAB — HEMOGLOBIN A1C
Hgb A1c MFr Bld: 8.2 % — ABNORMAL HIGH (ref 4.8–5.6)
Mean Plasma Glucose: 188.64 mg/dL

## 2018-05-21 ENCOUNTER — Other Ambulatory Visit (HOSPITAL_COMMUNITY)
Admission: RE | Admit: 2018-05-21 | Discharge: 2018-05-21 | Disposition: A | Discharge status: Home / Self Care | Payer: Medicare Other | Source: Skilled Nursing Facility | Attending: Adult Health | Admitting: Adult Health

## 2018-05-21 DIAGNOSIS — G629 Polyneuropathy, unspecified: Secondary | ICD-10-CM | POA: Diagnosis present

## 2018-05-21 DIAGNOSIS — N184 Chronic kidney disease, stage 4 (severe): Secondary | ICD-10-CM | POA: Insufficient documentation

## 2018-05-21 LAB — BASIC METABOLIC PANEL
ANION GAP: 11 (ref 5–15)
BUN: 38 mg/dL — ABNORMAL HIGH (ref 8–23)
CALCIUM: 9.1 mg/dL (ref 8.9–10.3)
CO2: 29 mmol/L (ref 22–32)
Chloride: 99 mmol/L (ref 98–111)
Creatinine, Ser: 1.74 mg/dL — ABNORMAL HIGH (ref 0.44–1.00)
GFR, EST AFRICAN AMERICAN: 29 mL/min — AB (ref >60)
GFR, EST NON AFRICAN AMERICAN: 25 mL/min — AB (ref >60)
GLUCOSE: 168 mg/dL — AB (ref 70–99)
Potassium: 4.2 mmol/L (ref 3.5–5.1)
SODIUM: 139 mmol/L (ref 135–145)

## 2018-05-22 ENCOUNTER — Encounter: Payer: Self-pay | Admitting: Adult Health

## 2018-05-22 ENCOUNTER — Non-Acute Institutional Stay (SKILLED_NURSING_FACILITY): Payer: Medicare Other | Admitting: Adult Health

## 2018-05-22 ENCOUNTER — Encounter (HOSPITAL_COMMUNITY)
Admission: AD | Admit: 2018-05-22 | Discharge: 2018-05-22 | Disposition: A | Discharge status: Home / Self Care | Payer: Medicare Other | Source: Skilled Nursing Facility | Attending: Adult Health | Admitting: Adult Health

## 2018-05-22 DIAGNOSIS — G629 Polyneuropathy, unspecified: Secondary | ICD-10-CM | POA: Insufficient documentation

## 2018-05-22 DIAGNOSIS — E1169 Type 2 diabetes mellitus with other specified complication: Secondary | ICD-10-CM

## 2018-05-22 DIAGNOSIS — I10 Essential (primary) hypertension: Secondary | ICD-10-CM | POA: Insufficient documentation

## 2018-05-22 DIAGNOSIS — F0280 Dementia in other diseases classified elsewhere without behavioral disturbance: Secondary | ICD-10-CM

## 2018-05-22 DIAGNOSIS — E1151 Type 2 diabetes mellitus with diabetic peripheral angiopathy without gangrene: Secondary | ICD-10-CM | POA: Diagnosis not present

## 2018-05-22 DIAGNOSIS — G4700 Insomnia, unspecified: Secondary | ICD-10-CM | POA: Insufficient documentation

## 2018-05-22 DIAGNOSIS — F329 Major depressive disorder, single episode, unspecified: Secondary | ICD-10-CM | POA: Insufficient documentation

## 2018-05-22 DIAGNOSIS — N184 Chronic kidney disease, stage 4 (severe): Secondary | ICD-10-CM

## 2018-05-22 DIAGNOSIS — G301 Alzheimer's disease with late onset: Secondary | ICD-10-CM | POA: Diagnosis not present

## 2018-05-22 DIAGNOSIS — E785 Hyperlipidemia, unspecified: Secondary | ICD-10-CM

## 2018-05-22 DIAGNOSIS — E1122 Type 2 diabetes mellitus with diabetic chronic kidney disease: Secondary | ICD-10-CM | POA: Insufficient documentation

## 2018-05-22 LAB — BASIC METABOLIC PANEL
ANION GAP: 9 (ref 5–15)
BUN: 36 mg/dL — ABNORMAL HIGH (ref 8–23)
CALCIUM: 8.7 mg/dL — AB (ref 8.9–10.3)
CO2: 31 mmol/L (ref 22–32)
Chloride: 100 mmol/L (ref 98–111)
Creatinine, Ser: 1.69 mg/dL — ABNORMAL HIGH (ref 0.44–1.00)
GFR, EST AFRICAN AMERICAN: 30 mL/min — AB (ref >60)
GFR, EST NON AFRICAN AMERICAN: 26 mL/min — AB (ref >60)
Glucose, Bld: 148 mg/dL — ABNORMAL HIGH (ref 70–99)
Potassium: 4.3 mmol/L (ref 3.5–5.1)
SODIUM: 140 mmol/L (ref 135–145)

## 2018-05-22 NOTE — Progress Notes (Signed)
Location:   [place] Room Number: [place] of Service:  SNF (31)   CODE STATUS: DNR  Allergies  Allergen Reactions  . Contrast Media [Iodinated Diagnostic Agents] Other (See Comments)    Unknown-listed on MAR-patient is not aware of these allergies  . Latex Other (See Comments)    Unknown-listed on MAR-patient is not aware of these allergies    Chief Complaint  Patient presents with  . Acute Visit    Lab Follow up    HPI:  She has stage 4 chronic kidney disease. Her GFR is between 25-32. She is not appropriate for tamiflu based upon her renal function. She is on metformin which should be discontinued; she will need to have her namenda lowered. More than likely she is not benefiting from pravachol due to her advanced age.   Past Medical History:  Diagnosis Date  . Acute cystitis   . Dementia ([place])   . Dermatomycosis, unspecified   . DJD (degenerative joint disease)    Spine  . DM (diabetes mellitus) ([place])   . GERD (gastroesophageal reflux disease)   . History of pulmonary embolism   . History of supraventricular tachycardia   . Hyperlipidemia   . Hypertension   . Hypothyroidism   . Long term (current) use of anticoagulants    coumadin  . Obesity   . Other malaise and fatigue   . PVD (peripheral vascular disease) ([place])     Past Surgical History:  Procedure Laterality Date  . ATRIAL ABLATION SURGERY  01/2003   Catheter, to tachic palpitations  . ESOPHAGOGASTRODUODENOSCOPY N/A 09/26/2017   Procedure: ESOPHAGOGASTRODUODENOSCOPY (EGD);  Surgeon: Rogene Houston, MD;  Location[facility];  Service: Endoscopy;  Laterality: N/A;  2:50  . MASTECTOMY  2003   Left  . THYROIDECTOMY, PARTIAL  1962    Social History   Socioeconomic History  . Marital status: Widowed    Spouse name: Not on file  . Number of children: Not on file  . Years of education: Not on file  . Highest education level: Not on file  Occupational History  .  Occupation: Retired    Fish farm manager: RETIRED  Social Needs  . Financial resource strain: Not hard at all  . Food insecurity:    Worry: Never true    Inability: Never true  . Transportation needs:    Medical: No    Non-medical: No  Tobacco Use  . Smoking status: Former Research scientist (life sciences)  . Smokeless tobacco: Never Used  Substance and Sexual Activity  . Alcohol use: No  . Drug use: No  . Sexual activity: Not on file  Lifestyle  . Physical activity:    Days per week: 0 days    Minutes per session: 0 min  . Stress: Not at all  Relationships  . Social connections:    Talks on phone: Never    Gets together: More than three times a week    Attends religious service: Never    Active member of club or organization: No    Attends meetings of clubs or organizations: Never    Relationship status: Widowed  . Intimate partner violence:    Fear of current or ex partner: No    Emotionally abused: No    Physically abused: No    Forced sexual activity: No  Other Topics Concern  . Not on file  Social History Narrative   Widowed   One daughter   Family History  Problem Relation  Age of Onset  . Coronary artery disease Sister   . Coronary artery disease Sister   . Coronary artery disease Sister   . Heart attack Sister   . Alcohol abuse Sister       VITAL SIGNS BP 139/83   Pulse 84   Temp 98 F (36.7 C)   Resp 18   Ht 5\' 9"  (1.753 m)   Wt 227 lb 3.2 oz (103.1 kg)   BMI 33.55 kg/m   Outpatient Encounter Medications as of 05/22/2018  Medication Sig  . acetaminophen (TYLENOL) 325 MG tablet Take 650 mg by mouth 2 (two) times daily.  Marland Kitchen aspirin 81 MG chewable tablet Chew 81 mg by mouth daily.  Roseanne Kaufman Peru-Castor Oil (VENELEX) OINT Apply 1 application topically See admin instructions. Apply to sacrum and bilateral buttocks each shift and prn for prevention   . cilostazol (PLETAL) 50 MG tablet Take 50 mg by mouth 2 (two) times daily.    Marland Kitchen donepezil (ARICEPT) 10 MG tablet Take 10 mg by mouth  daily.   Marland Kitchen escitalopram (LEXAPRO) 5 MG tablet Take 5 mg by mouth at bedtime.   . furosemide (LASIX) 40 MG tablet Take 40 mg by mouth daily.   Marland Kitchen gabapentin (NEURONTIN) 300 MG capsule Take 300 mg by mouth 2 (two) times daily.    Marland Kitchen levothyroxine (SYNTHROID, LEVOTHROID) 50 MCG tablet Take 50 mcg by mouth daily.    . memantine (NAMENDA) 10 MG tablet Take 10 mg by mouth 2 (two) times daily.   . metFORMIN (GLUCOPHAGE) 500 MG tablet Take 500 mg by mouth daily with breakfast.   . metolazone (ZAROXOLYN) 2.5 MG tablet Take 2.5 mg by mouth every Monday, Wednesday, and Friday.   . Multiple Vitamins-Minerals (MULTIVITAMIN WITH MINERALS) tablet Take 1 tablet by mouth daily.  . NON FORMULARY Diet Type:  Consistent CHO Diet  . pravastatin (PRAVACHOL) 80 MG tablet Take 80 mg by mouth at bedtime.    . ranitidine (ZANTAC) 150 MG capsule Take 1 capsule (150 mg total) by mouth 2 (two) times daily.   No facility-administered encounter medications on file as of 05/22/2018.      SIGNIFICANT DIAGNOSTIC EXAMS  LABS REVIEWED TODAY:   12-29-17: urine micro-albumin: 19.7 05-18-18: wbc 4.8; hgb 12.6; hct 44.4; mcv 85.4; plt 236; glucose 148; bun 34; creat 1.65; k+ 4.4; na+= 141; ca 8.8; liver normal albumin 3.1 TSH 1.544; hgb a1c 8.2 05-21-18: glucose 168; bun 38; creat 1.74; k+ 4.2; na++ 139; ca 9.1    Review of Systems  Unable to perform ROS: Dementia (unable to participate )    Physical Exam Constitutional:      General: She is not in acute distress.    Appearance: She is well-developed. She is not diaphoretic.     Comments: Overweight   Neck:     Musculoskeletal: Neck supple.     Thyroid: No thyromegaly.  Cardiovascular:     Rate and Rhythm: Normal rate and regular rhythm.     Pulses: Normal pulses.     Heart sounds: Normal heart sounds.  Pulmonary:     Effort: Pulmonary effort is normal. No respiratory distress.     Breath sounds: Normal breath sounds.  Abdominal:     General: Bowel sounds are  normal. There is no distension.     Palpations: Abdomen is soft.     Tenderness: There is no abdominal tenderness.  Musculoskeletal:     Right lower leg: Edema present.     Left lower  leg: Edema present.     Comments: Is able to move all extremities 2+ bilateral lower extremity edema   Lymphadenopathy:     Cervical: No cervical adenopathy.  Skin:    General: Skin is warm and dry.     Comments: Bilateral lower extremities discolored   Neurological:     Mental Status: She is alert. Mental status is at baseline.  Psychiatric:        Mood and Affect: Mood normal.        ASSESSMENT/ PLAN:  TODAY:   1. CKD stage 4 due to type 2 diabetes mellitus: is without change: bun 38; creat 1.74 will monitor  2.  Diabetes mellitus with peripheral vascular disease: is without change: hgb a1c 8.2: will stop metformin due to poor renal function; will begin glipizide 2.5 mg daily and will monitor her status.   3. Dyslipidemia associated with type 2 diabetes mellitus: is stable will stop pravachol at this time due to her advanced age.   4. Late onset alzheimer's disease without behavorial disturbance; is without change: will lower namenda to 5 mg twice daily due to her poor renal function    Will repeat bmp on 05-29-18    MD is aware of resident's narcotic use and is in agreement with current plan of care. We will attempt to wean resident as apropriate   Ok Edwards NP [facility]  Contact [phone] Monday through Friday 8am- 5pm  After hours call [phone]

## 2018-05-29 ENCOUNTER — Encounter (HOSPITAL_COMMUNITY)
Admission: RE | Admit: 2018-05-29 | Discharge: 2018-05-29 | Disposition: A | Discharge status: Home / Self Care | Payer: Medicare Other | Source: Skilled Nursing Facility | Attending: Internal Medicine | Admitting: Internal Medicine

## 2018-05-29 DIAGNOSIS — G629 Polyneuropathy, unspecified: Secondary | ICD-10-CM | POA: Insufficient documentation

## 2018-05-29 DIAGNOSIS — N184 Chronic kidney disease, stage 4 (severe): Secondary | ICD-10-CM | POA: Insufficient documentation

## 2018-05-29 DIAGNOSIS — I10 Essential (primary) hypertension: Secondary | ICD-10-CM | POA: Insufficient documentation

## 2018-05-30 ENCOUNTER — Encounter (HOSPITAL_COMMUNITY)
Admission: RE | Admit: 2018-05-30 | Discharge: 2018-05-30 | Disposition: A | Discharge status: Home / Self Care | Payer: Medicare Other | Source: Skilled Nursing Facility | Attending: Internal Medicine | Admitting: Internal Medicine

## 2018-05-30 DIAGNOSIS — I129 Hypertensive chronic kidney disease with stage 1 through stage 4 chronic kidney disease, or unspecified chronic kidney disease: Secondary | ICD-10-CM | POA: Insufficient documentation

## 2018-05-30 DIAGNOSIS — N184 Chronic kidney disease, stage 4 (severe): Secondary | ICD-10-CM | POA: Insufficient documentation

## 2018-05-30 LAB — BASIC METABOLIC PANEL
ANION GAP: 8 (ref 5–15)
BUN: 37 mg/dL — ABNORMAL HIGH (ref 8–23)
CO2: 32 mmol/L (ref 22–32)
CREATININE: 1.68 mg/dL — AB (ref 0.44–1.00)
Calcium: 8.5 mg/dL — ABNORMAL LOW (ref 8.9–10.3)
Chloride: 99 mmol/L (ref 98–111)
GFR, EST AFRICAN AMERICAN: 30 mL/min — AB (ref >60)
GFR, EST NON AFRICAN AMERICAN: 26 mL/min — AB (ref >60)
Glucose, Bld: 140 mg/dL — ABNORMAL HIGH (ref 70–99)
Potassium: 4.5 mmol/L (ref 3.5–5.1)
SODIUM: 139 mmol/L (ref 135–145)

## 2018-06-01 ENCOUNTER — Non-Acute Institutional Stay (SKILLED_NURSING_FACILITY): Payer: Medicare Other | Admitting: Adult Health

## 2018-06-01 ENCOUNTER — Encounter: Payer: Self-pay | Admitting: Adult Health

## 2018-06-01 DIAGNOSIS — R600 Localized edema: Secondary | ICD-10-CM

## 2018-06-01 NOTE — Progress Notes (Signed)
Location:   [place] Room Number: [place] of Service:  SNF (31)   CODE STATUS: DNR  Allergies  Allergen Reactions  . Contrast Media [Iodinated Diagnostic Agents] Other (See Comments)    Unknown-listed on MAR-patient is not aware of these allergies  . Latex Other (See Comments)    Unknown-listed on MAR-patient is not aware of these allergies    Chief Complaint  Patient presents with  . Acute Visit    Weight Gain    HPI:  She has gained weight from 227 pounds on 05-21-18 to her weight of 231 pounds on 05-28-18.   Past Medical History:  Diagnosis Date  . Acute cystitis   . Dementia ([place])   . Dermatomycosis, unspecified   . DJD (degenerative joint disease)    Spine  . DM (diabetes mellitus) ([place])   . GERD (gastroesophageal reflux disease)   . History of pulmonary embolism   . History of supraventricular tachycardia   . Hyperlipidemia   . Hypertension   . Hypothyroidism   . Long term (current) use of anticoagulants    coumadin  . Obesity   . Other malaise and fatigue   . PVD (peripheral vascular disease) ([place])     Past Surgical History:  Procedure Laterality Date  . ATRIAL ABLATION SURGERY  01/2003   Catheter, to tachic palpitations  . ESOPHAGOGASTRODUODENOSCOPY N/A 10/19/2017   Procedure: ESOPHAGOGASTRODUODENOSCOPY (EGD);  Surgeon: Rogene Houston, MD;  [facility];  Service: Endoscopy;  Laterality: N/A;  2:50  . MASTECTOMY  2003   Left  . THYROIDECTOMY, PARTIAL  1962    Social History   Socioeconomic History  . Marital status: Widowed    Spouse name: Not on file  . Number of children: Not on file  . Years of education: Not on file  . Highest education level: Not on file  Occupational History  . Occupation: Retired    Fish farm manager: RETIRED  Social Needs  . Financial resource strain: Not hard at all  . Food insecurity:    Worry: Never true    Inability: Never true  . Transportation needs:    Medical: No   Non-medical: No  Tobacco Use  . Smoking status: Former Research scientist (life sciences)  . Smokeless tobacco: Never Used  Substance and Sexual Activity  . Alcohol use: No  . Drug use: No  . Sexual activity: Not on file  Lifestyle  . Physical activity:    Days per week: 0 days    Minutes per session: 0 min  . Stress: Not at all  Relationships  . Social connections:    Talks on phone: Never    Gets together: More than three times a week    Attends religious service: Never    Active member of club or organization: No    Attends meetings of clubs or organizations: Never    Relationship status: Widowed  . Intimate partner violence:    Fear of current or ex partner: No    Emotionally abused: No    Physically abused: No    Forced sexual activity: No  Other Topics Concern  . Not on file  Social History Narrative   Widowed   One daughter   Family History  Problem Relation Age of Onset  . Coronary artery disease Sister   . Coronary artery disease Sister   . Coronary artery disease Sister   . Heart attack Sister   . Alcohol abuse Sister  VITAL SIGNS BP (!) 157/80   Pulse 100   Temp (!) 97.4 F (36.3 C)   Resp 20   Ht 5\' 9"  (1.753 m)   Wt 231 lb 4.8 oz (104.9 kg)   BMI 34.16 kg/m   Outpatient Encounter Medications as of 06/01/2018  Medication Sig  . acetaminophen (TYLENOL) 325 MG tablet Take 650 mg by mouth 2 (two) times daily.  Marland Kitchen aspirin 81 MG chewable tablet Chew 81 mg by mouth daily.  Roseanne Kaufman Peru-Castor Oil (VENELEX) OINT Apply 1 application topically See admin instructions. Apply to sacrum and bilateral buttocks each shift and prn for prevention   . cilostazol (PLETAL) 50 MG tablet Take 50 mg by mouth 2 (two) times daily.    Marland Kitchen donepezil (ARICEPT) 10 MG tablet Take 10 mg by mouth daily.   Marland Kitchen escitalopram (LEXAPRO) 5 MG tablet Take 5 mg by mouth at bedtime.   . furosemide (LASIX) 40 MG tablet Take 40 mg by mouth daily.   Marland Kitchen gabapentin (NEURONTIN) 300 MG capsule Take 300 mg by mouth 2  (two) times daily.    Marland Kitchen glipiZIDE (GLUCOTROL XL) 2.5 MG 24 hr tablet Take 2.5 mg by mouth daily with breakfast.  . levothyroxine (SYNTHROID, LEVOTHROID) 50 MCG tablet Take 50 mcg by mouth daily.    Marland Kitchen linagliptin (TRADJENTA) 5 MG TABS tablet Take 5 mg by mouth daily.  . memantine (NAMENDA) 5 MG tablet Take 5 mg by mouth 2 (two) times daily.   . metolazone (ZAROXOLYN) 2.5 MG tablet Take 2.5 mg by mouth every Monday, Wednesday, and Friday.   . Multiple Vitamins-Minerals (MULTIVITAMIN WITH MINERALS) tablet Take 1 tablet by mouth daily.  . NON FORMULARY Diet Type:  Consistent CHO Diet  . ranitidine (ZANTAC) 150 MG capsule Take 1 capsule (150 mg total) by mouth 2 (two) times daily.  . [DISCONTINUED] metFORMIN (GLUCOPHAGE) 500 MG tablet Take 500 mg by mouth daily with breakfast.   . [DISCONTINUED] pravastatin (PRAVACHOL) 80 MG tablet Take 80 mg by mouth at bedtime.     No facility-administered encounter medications on file as of 06/01/2018.      SIGNIFICANT DIAGNOSTIC EXAMS  LABS REVIEWED PREVIOUS:   12-29-17: urine micro-albumin: 19.7 05-18-18: wbc 4.8; hgb 12.6; hct 44.4; mcv 85.4; plt 236; glucose 148; bun 34; creat 1.65; k+ 4.4; na++ 141; ca 8.8; liver normal albumin 3.1 TSH 1.544; hgb a1c 8.2 05-21-18: glucose 168; bun 38; creat 1.74; k+ 4.2; na++ 139; ca 9.1   TODAY:   05-22-18: glucose 148; bun 36; creat 1.69 k+ 4.3; na++ 140 ca 8.6  05-30-18: glucose 140; bun 376 creat 1.68; k+ 4.5; na++ 139; ca 8.5     Review of Systems  Unable to perform ROS: Dementia (unable to participate )     Physical Exam Constitutional:      General: She is not in acute distress.    Appearance: She is well-developed. She is not diaphoretic.  Neck:     Musculoskeletal: Neck supple.     Thyroid: No thyromegaly.     Comments: History of left mastectomy  Cardiovascular:     Rate and Rhythm: Normal rate and regular rhythm.     Pulses: Normal pulses.     Heart sounds: Normal heart sounds.     Comments:  History of atrial ablation  Pulmonary:     Effort: Pulmonary effort is normal. No respiratory distress.     Breath sounds: Normal breath sounds.  Abdominal:     General: Bowel sounds are  normal. There is no distension.     Palpations: Abdomen is soft.     Tenderness: There is no abdominal tenderness.  Musculoskeletal:     Right lower leg: Edema present.     Left lower leg: Edema present.     Comments:  Is able to move all extremities 2+ bilateral lower extremity edema    Lymphadenopathy:     Cervical: No cervical adenopathy.  Skin:    General: Skin is warm and dry.     Comments: Bilateral lower extremities discolored History of left mastectomy   Neurological:     Mental Status: She is alert. Mental status is at baseline.  Psychiatric:        Mood and Affect: Mood normal.       ASSESSMENT/ PLAN:  TODAY:   1. Bilateral leg edema: is worse will change lasix to 40 mg twice daily and will check bmp 06-09-18    MD is aware of resident's narcotic use and is in agreement with current plan of care. We will attempt to wean resident as apropriate   Ok Edwards NP [facility]  Contact [phone] Monday through Friday 8am- 5pm  After hours call [phone]

## 2018-06-09 ENCOUNTER — Other Ambulatory Visit (HOSPITAL_COMMUNITY)
Admission: RE | Admit: 2018-06-09 | Discharge: 2018-06-09 | Disposition: A | Discharge status: Home / Self Care | Payer: Medicare Other | Source: Other Acute Inpatient Hospital | Attending: Adult Health | Admitting: Adult Health

## 2018-06-09 DIAGNOSIS — N184 Chronic kidney disease, stage 4 (severe): Secondary | ICD-10-CM | POA: Insufficient documentation

## 2018-06-09 LAB — BASIC METABOLIC PANEL
Anion gap: 8 (ref 5–15)
BUN: 43 mg/dL — AB (ref 8–23)
CALCIUM: 8.8 mg/dL — AB (ref 8.9–10.3)
CO2: 34 mmol/L — ABNORMAL HIGH (ref 22–32)
Chloride: 97 mmol/L — ABNORMAL LOW (ref 98–111)
Creatinine, Ser: 2.16 mg/dL — ABNORMAL HIGH (ref 0.44–1.00)
GFR calc Af Amer: 22 mL/min — ABNORMAL LOW (ref >60)
GFR, EST NON AFRICAN AMERICAN: 19 mL/min — AB (ref >60)
Glucose, Bld: 123 mg/dL — ABNORMAL HIGH (ref 70–99)
POTASSIUM: 4.2 mmol/L (ref 3.5–5.1)
SODIUM: 139 mmol/L (ref 135–145)

## 2018-06-12 ENCOUNTER — Non-Acute Institutional Stay (SKILLED_NURSING_FACILITY): Payer: Medicare Other | Admitting: Adult Health

## 2018-06-12 ENCOUNTER — Other Ambulatory Visit: Payer: Self-pay | Admitting: Adult Health

## 2018-06-12 ENCOUNTER — Encounter: Payer: Self-pay | Admitting: Adult Health

## 2018-06-12 DIAGNOSIS — N184 Chronic kidney disease, stage 4 (severe): Secondary | ICD-10-CM

## 2018-06-12 DIAGNOSIS — F0280 Dementia in other diseases classified elsewhere without behavioral disturbance: Secondary | ICD-10-CM

## 2018-06-12 DIAGNOSIS — I129 Hypertensive chronic kidney disease with stage 1 through stage 4 chronic kidney disease, or unspecified chronic kidney disease: Secondary | ICD-10-CM

## 2018-06-12 DIAGNOSIS — E1122 Type 2 diabetes mellitus with diabetic chronic kidney disease: Secondary | ICD-10-CM | POA: Diagnosis not present

## 2018-06-12 DIAGNOSIS — I739 Peripheral vascular disease, unspecified: Secondary | ICD-10-CM

## 2018-06-12 DIAGNOSIS — R600 Localized edema: Secondary | ICD-10-CM

## 2018-06-12 DIAGNOSIS — E1142 Type 2 diabetes mellitus with diabetic polyneuropathy: Secondary | ICD-10-CM

## 2018-06-12 DIAGNOSIS — E1169 Type 2 diabetes mellitus with other specified complication: Secondary | ICD-10-CM | POA: Diagnosis not present

## 2018-06-12 DIAGNOSIS — G301 Alzheimer's disease with late onset: Secondary | ICD-10-CM

## 2018-06-12 DIAGNOSIS — E038 Other specified hypothyroidism: Secondary | ICD-10-CM | POA: Diagnosis not present

## 2018-06-12 DIAGNOSIS — E785 Hyperlipidemia, unspecified: Secondary | ICD-10-CM

## 2018-06-12 NOTE — Progress Notes (Signed)
Location:    [facility] Room Number: 14/W Place of Service:  SNF (31)   CODE STATUS: DNR  Allergies  Allergen Reactions  . Contrast Media [Iodinated Diagnostic Agents] Other (See Comments)    Unknown-listed on MAR-patient is not aware of these allergies  . Latex Other (See Comments)    Unknown-listed on MAR-patient is not aware of these allergies    Chief Complaint  Patient presents with  . Medical Management of Chronic Issues    Hypertension associated with stage 4 chronic kidney disease due to type 2 diabetes mellitus; peripheral vascular disease; controlled type 2 diabetes mellitus with stage 4 chronic kidney disease without long current use of insulin.     HPI:  She is a 85 year old long term resident of this facility being seen for the management of her chronic illnesses: hypertension pvd; diabetes. There are no reports of agitation; anxiety; no uncontrolled pain. There are no reports of fevers present.   Past Medical History:  Diagnosis Date  . Acute cystitis   . Dementia ([place])   . Dermatomycosis, unspecified   . DJD (degenerative joint disease)    Spine  . DM (diabetes mellitus) ([place])   . GERD (gastroesophageal reflux disease)   . History of pulmonary embolism   . History of supraventricular tachycardia   . Hyperlipidemia   . Hypertension   . Hypothyroidism   . Long term (current) use of anticoagulants    coumadin  . Obesity   . Other malaise and fatigue   . PVD (peripheral vascular disease) ([place])     Past Surgical History:  Procedure Laterality Date  . ATRIAL ABLATION SURGERY  01/2003   Catheter, to tachic palpitations  . ESOPHAGOGASTRODUODENOSCOPY N/A 09/25/2017   Procedure: ESOPHAGOGASTRODUODENOSCOPY (EGD);  Surgeon: Rogene Houston, MD;  [facility]: [facility];  Service: Endoscopy;  Laterality: N/A;  2:50  . MASTECTOMY  2003   Left  . THYROIDECTOMY, PARTIAL  1962    Social History   Socioeconomic History  . Marital status:  Widowed    Spouse name: Not on file  . Number of children: Not on file  . Years of education: Not on file  . Highest education level: Not on file  Occupational History  . Occupation: Retired    Fish farm manager: RETIRED  Social Needs  . Financial resource strain: Not hard at all  . Food insecurity:    Worry: Never true    Inability: Never true  . Transportation needs:    Medical: No    Non-medical: No  Tobacco Use  . Smoking status: Former Research scientist (life sciences)  . Smokeless tobacco: Never Used  Substance and Sexual Activity  . Alcohol use: No  . Drug use: No  . Sexual activity: Not on file  Lifestyle  . Physical activity:    Days per week: 0 days    Minutes per session: 0 min  . Stress: Not at all  Relationships  . Social connections:    Talks on phone: Never    Gets together: More than three times a week    Attends religious service: Never    Active member of club or organization: No    Attends meetings of clubs or organizations: Never    Relationship status: Widowed  . Intimate partner violence:    Fear of current or ex partner: No    Emotionally abused: No    Physically abused: No    Forced sexual activity: No  Other Topics Concern  .  Not on file  Social History Narrative   Widowed   One daughter   Family History  Problem Relation Age of Onset  . Coronary artery disease Sister   . Coronary artery disease Sister   . Coronary artery disease Sister   . Heart attack Sister   . Alcohol abuse Sister       VITAL SIGNS BP 107/74   Pulse 100   Temp 98.4 F (36.9 C) (Oral)   Resp 20   Ht 5\' 9"  (1.753 m)   Wt 229 lb (103.9 kg)   SpO2 99%   BMI 33.82 kg/m   Outpatient Encounter Medications as of 06/12/2018  Medication Sig  . acetaminophen (TYLENOL) 325 MG tablet Take 650 mg by mouth 2 (two) times daily.  Marland Kitchen aspirin 81 MG chewable tablet Chew 81 mg by mouth daily.  Roseanne Kaufman Peru-Castor Oil (VENELEX) OINT Apply 1 application topically See admin instructions. Apply to sacrum and  bilateral buttocks each shift and prn for prevention   . cilostazol (PLETAL) 50 MG tablet Take 50 mg by mouth 2 (two) times daily.    Marland Kitchen donepezil (ARICEPT) 10 MG tablet Take 10 mg by mouth daily.   Marland Kitchen escitalopram (LEXAPRO) 5 MG tablet Take 5 mg by mouth at bedtime.   . furosemide (LASIX) 40 MG tablet Take 40 mg by mouth twice daily.   Marland Kitchen gabapentin (NEURONTIN) 300 MG capsule Take 300 mg by mouth 2 (two) times daily.    Marland Kitchen glipiZIDE (GLUCOTROL XL) 2.5 MG 24 hr tablet Take 2.5 mg by mouth daily with breakfast.  . levothyroxine (SYNTHROID, LEVOTHROID) 50 MCG tablet Take 50 mcg by mouth daily.    Marland Kitchen linagliptin (TRADJENTA) 5 MG TABS tablet Take 5 mg by mouth daily.  . memantine (NAMENDA) 5 MG tablet Take 5 mg by mouth 2 (two) times daily.   . metolazone (ZAROXOLYN) 2.5 MG tablet Take 2.5 mg by mouth every Monday, Wednesday, and Friday.   . Multiple Vitamins-Minerals (MULTIVITAMIN WITH MINERALS) tablet Take 1 tablet by mouth daily.  . NON FORMULARY Diet Type:  Consistent CHO Diet  . ranitidine (ZANTAC) 150 MG capsule Take 1 capsule (150 mg total) by mouth 2 (two) times daily.   No facility-administered encounter medications on file as of 06/12/2018.      SIGNIFICANT DIAGNOSTIC EXAMS   LABS REVIEWED PREVIOUS:   12-29-17: urine micro-albumin: 19.7 05-18-18: wbc 4.8; hgb 12.6; hct 44.4; mcv 85.4; plt 236; glucose 148; bun 34; creat 1.65; k+ 4.4; na++ 141; ca 8.8; liver normal albumin 3.1 TSH 1.544; hgb a1c 8.2 05-21-18: glucose 168; bun 38; creat 1.74; k+ 4.2; na++ 139; ca 9.1  05-22-18: glucose 148; bun 36; creat 1.69 k+ 4.3; na++ 140 ca 8.6  05-30-18: glucose 140; bun 376 creat 1.68; k+ 4.5; na++ 139; ca 8.5   TODAY:   06-09-18: glucose 123 bun 43; creat 2.16;  k+ 4.2; na++ 139 ca 8.8   Review of Systems  Unable to perform ROS: Dementia (unable to participate )    Physical Exam Constitutional:      General: She is not in acute distress.    Appearance: She is well-developed. She is not  diaphoretic.  Neck:     Musculoskeletal: Neck supple.     Thyroid: No thyromegaly.     Comments: History of left mastectomy  Cardiovascular:     Rate and Rhythm: Normal rate and regular rhythm.     Pulses: Normal pulses.     Heart sounds: Normal heart  sounds.     Comments: History of atrial ablation  Pulmonary:     Effort: Pulmonary effort is normal. No respiratory distress.     Breath sounds: Normal breath sounds.  Abdominal:     General: Bowel sounds are normal. There is no distension.     Palpations: Abdomen is soft.     Tenderness: There is no abdominal tenderness.  Musculoskeletal:     Right lower leg: Edema present.     Left lower leg: Edema present.     Comments: Is able to move all extremities 2+ bilateral lower extremity edema     Lymphadenopathy:     Cervical: No cervical adenopathy.  Skin:    General: Skin is warm and dry.     Comments: Bilateral lower extremities discolored  Neurological:     Mental Status: She is alert. Mental status is at baseline.  Psychiatric:        Mood and Affect: Mood normal.      ASSESSMENT/ PLAN:  TODAY:   1. Hypertension associated with stage 4 chronic kidney disease due to type 2 diabetes mellitus: is stable b/p 107/74: will continue asa 81 mg daily  2. Peripheral vascular disease: is stable will continue pletal 50 mg twice daily tylenol 650 mg twice daily  and asa 81 mg daily  3. Controlled type 2 diabetes mellitus with stage 4 chronic kidney disase without long term current use of insulin: stable hgb a1c 8.2; will continue glucotrol xl 2.5 mg daily and tradjenta 5 mg daily   4. Dyslipidemia associated with type 2 diabetes mellitus: is stable will monitor  5. Other hypothyroidism: is stable tsh 1.544: will continue synthroid 50 mcg daily   6. CKD stage 4 due to type 2 diabetes mellitus: is worse: bun 43; creat 2.16  7. Bilateral leg edema: is stable will continue lasix 40 mg twice daily will stop zaroxolyn due to her poor  renal function  8. Late onset alzheimer's disease without behavioral disturbance: is without change weight is 229 pounds will continue aricept 10 mg and namenda 5 mg twice daily   9. Diabetic peripheral neuropathy: is stable will continue neurontin 300 mg twice daily and tylenol 650 mg twice daily    Will check bmp on 06-19-18       MD is aware of resident's narcotic use and is in agreement with current plan of care. We will attempt to wean resident as apropriate   Ok Edwards NP [facility]  Contact [phone] Monday through Friday 8am- 5pm  After hours call [phone]

## 2018-06-13 ENCOUNTER — Encounter: Payer: Self-pay | Admitting: Adult Health

## 2018-06-13 ENCOUNTER — Non-Acute Institutional Stay (SKILLED_NURSING_FACILITY): Payer: Medicare Other | Admitting: Adult Health

## 2018-06-13 ENCOUNTER — Encounter (HOSPITAL_COMMUNITY)
Admission: RE | Admit: 2018-06-13 | Discharge: 2018-06-13 | Disposition: A | Discharge status: Home / Self Care | Payer: Medicare Other | Source: Skilled Nursing Facility | Attending: Internal Medicine | Admitting: Internal Medicine

## 2018-06-13 DIAGNOSIS — N184 Chronic kidney disease, stage 4 (severe): Secondary | ICD-10-CM | POA: Diagnosis not present

## 2018-06-13 DIAGNOSIS — E1122 Type 2 diabetes mellitus with diabetic chronic kidney disease: Secondary | ICD-10-CM

## 2018-06-13 DIAGNOSIS — G629 Polyneuropathy, unspecified: Secondary | ICD-10-CM | POA: Diagnosis present

## 2018-06-13 DIAGNOSIS — F0280 Dementia in other diseases classified elsewhere without behavioral disturbance: Secondary | ICD-10-CM | POA: Diagnosis not present

## 2018-06-13 DIAGNOSIS — R600 Localized edema: Secondary | ICD-10-CM | POA: Insufficient documentation

## 2018-06-13 DIAGNOSIS — G301 Alzheimer's disease with late onset: Secondary | ICD-10-CM

## 2018-06-13 DIAGNOSIS — I1310 Hypertensive heart and chronic kidney disease without heart failure, with stage 1 through stage 4 chronic kidney disease, or unspecified chronic kidney disease: Secondary | ICD-10-CM | POA: Diagnosis present

## 2018-06-13 LAB — BASIC METABOLIC PANEL
Anion gap: 10 (ref 5–15)
BUN: 45 mg/dL — ABNORMAL HIGH (ref 8–23)
CHLORIDE: 95 mmol/L — AB (ref 98–111)
CO2: 32 mmol/L (ref 22–32)
CREATININE: 2.07 mg/dL — AB (ref 0.44–1.00)
Calcium: 9 mg/dL (ref 8.9–10.3)
GFR calc non Af Amer: 20 mL/min — ABNORMAL LOW (ref >60)
GFR, EST AFRICAN AMERICAN: 23 mL/min — AB (ref >60)
GLUCOSE: 117 mg/dL — AB (ref 70–99)
Potassium: 4.1 mmol/L (ref 3.5–5.1)
Sodium: 137 mmol/L (ref 135–145)

## 2018-06-13 NOTE — Progress Notes (Signed)
Location:   [place] Room Number: [place] of Service:  SNF (31)   CODE STATUS: DNR  Allergies  Allergen Reactions  . Contrast Media [Iodinated Diagnostic Agents] Other (See Comments)    Unknown-listed on MAR-patient is not aware of these allergies  . Latex Other (See Comments)    Unknown-listed on MAR-patient is not aware of these allergies    Chief Complaint  Patient presents with  . Acute Visit    Care Plan Meeting    HPI:  We have come together for her care plan meeting. Her weight has been stable. There are no reports of changes in appetite; no uncontrolled pain; no reports of agitation or insomnia.   Past Medical History:  Diagnosis Date  . Acute cystitis   . Dementia ([place])   . Dermatomycosis, unspecified   . DJD (degenerative joint disease)    Spine  . DM (diabetes mellitus) ([place])   . GERD (gastroesophageal reflux disease)   . History of pulmonary embolism   . History of supraventricular tachycardia   . Hyperlipidemia   . Hypertension   . Hypothyroidism   . Long term (current) use of anticoagulants    coumadin  . Obesity   . Other malaise and fatigue   . PVD (peripheral vascular disease) ([place])     Past Surgical History:  Procedure Laterality Date  . ATRIAL ABLATION SURGERY  01/2003   Catheter, to tachic palpitations  . ESOPHAGOGASTRODUODENOSCOPY N/A 10/05/2017   Procedure: ESOPHAGOGASTRODUODENOSCOPY (EGD);  Surgeon: Rogene Houston, MD;  [facility];  Service: Endoscopy;  Laterality: N/A;  2:50  . MASTECTOMY  2003   Left  . THYROIDECTOMY, PARTIAL  1962    Social History   Socioeconomic History  . Marital status: Widowed    Spouse name: Not on file  . Number of children: Not on file  . Years of education: Not on file  . Highest education level: Not on file  Occupational History  . Occupation: Retired    Fish farm manager: RETIRED  Social Needs  . Financial resource strain: Not hard at all  . Food  insecurity:    Worry: Never true    Inability: Never true  . Transportation needs:    Medical: No    Non-medical: No  Tobacco Use  . Smoking status: Former Research scientist (life sciences)  . Smokeless tobacco: Never Used  Substance and Sexual Activity  . Alcohol use: No  . Drug use: No  . Sexual activity: Not on file  Lifestyle  . Physical activity:    Days per week: 0 days    Minutes per session: 0 min  . Stress: Not at all  Relationships  . Social connections:    Talks on phone: Never    Gets together: More than three times a week    Attends religious service: Never    Active member of club or organization: No    Attends meetings of clubs or organizations: Never    Relationship status: Widowed  . Intimate partner violence:    Fear of current or ex partner: No    Emotionally abused: No    Physically abused: No    Forced sexual activity: No  Other Topics Concern  . Not on file  Social History Narrative   Widowed   One daughter   Family History  Problem Relation Age of Onset  . Coronary artery disease Sister   . Coronary artery disease Sister   . Coronary artery disease  Sister   . Heart attack Sister   . Alcohol abuse Sister       VITAL SIGNS BP 107/74   Pulse 100   Temp 97.8 F (36.6 C)   Resp 20   Ht 5\' 9"  (1.753 m)   Wt 229 lb (103.9 kg)   BMI 33.82 kg/m   Outpatient Encounter Medications as of 06/13/2018  Medication Sig  . acetaminophen (TYLENOL) 325 MG tablet Take 650 mg by mouth 2 (two) times daily.  Marland Kitchen aspirin 81 MG chewable tablet Chew 81 mg by mouth daily.  Roseanne Kaufman Peru-Castor Oil (VENELEX) OINT Apply 1 application topically See admin instructions. Apply to sacrum and bilateral buttocks each shift and prn for prevention   . cilostazol (PLETAL) 50 MG tablet Take 50 mg by mouth 2 (two) times daily.    Marland Kitchen donepezil (ARICEPT) 10 MG tablet Take 5 mg by mouth at bedtime.   Marland Kitchen escitalopram (LEXAPRO) 5 MG tablet Take 5 mg by mouth at bedtime.   . furosemide (LASIX) 40 MG tablet  Take 40 mg by mouth 2 (two) times daily.   Marland Kitchen gabapentin (NEURONTIN) 300 MG capsule Take 300 mg by mouth 2 (two) times daily.    Marland Kitchen glipiZIDE (GLUCOTROL XL) 2.5 MG 24 hr tablet Take 2.5 mg by mouth daily with breakfast.  . levothyroxine (SYNTHROID, LEVOTHROID) 50 MCG tablet Take 50 mcg by mouth daily.    Marland Kitchen linagliptin (TRADJENTA) 5 MG TABS tablet Take 5 mg by mouth daily.  . memantine (NAMENDA) 5 MG tablet Take 5 mg by mouth 2 (two) times daily.   . Multiple Vitamins-Minerals (MULTIVITAMIN WITH MINERALS) tablet Take 1 tablet by mouth daily.  . NON FORMULARY Diet Type:  Consistent CHO Diet  . ranitidine (ZANTAC) 150 MG capsule Take 1 capsule (150 mg total) by mouth 2 (two) times daily.  . [DISCONTINUED] metolazone (ZAROXOLYN) 2.5 MG tablet Take 2.5 mg by mouth every Monday, Wednesday, and Friday.    No facility-administered encounter medications on file as of 06/13/2018.      SIGNIFICANT DIAGNOSTIC EXAMS   LABS REVIEWED PREVIOUS:   12-29-17: urine micro-albumin: 19.7 05-18-18: wbc 4.8; hgb 12.6; hct 44.4; mcv 85.4; plt 236; glucose 148; bun 34; creat 1.65; k+ 4.4; na++ 141; ca 8.8; liver normal albumin 3.1 TSH 1.544; hgb a1c 8.2 05-21-18: glucose 168; bun 38; creat 1.74; k+ 4.2; na++ 139; ca 9.1  05-22-18: glucose 148; bun 36; creat 1.69 k+ 4.3; na++ 140 ca 8.6  05-30-18: glucose 140; bun 376 creat 1.68; k+ 4.5; na++ 139; ca 8.5  06-09-18: glucose 123 bun 43; creat 2.16;  k+ 4.2; na++ 139 ca 8.8  NO NEW LABS.     Review of Systems  Unable to perform ROS: Dementia (unble to participate )   Physical Exam Constitutional:      General: She is not in acute distress.    Appearance: She is well-developed. She is not diaphoretic.  Neck:     Musculoskeletal: Neck supple.     Thyroid: No thyromegaly.  Cardiovascular:     Rate and Rhythm: Normal rate and regular rhythm.     Heart sounds: Normal heart sounds.     Comments: History of atrial ablation  Pulmonary:     Effort: Pulmonary effort is  normal. No respiratory distress.     Breath sounds: Normal breath sounds.  Abdominal:     General: Bowel sounds are normal. There is no distension.     Palpations: Abdomen is soft.  Tenderness: There is no abdominal tenderness.  Musculoskeletal:     Right lower leg: Edema present.     Left lower leg: Edema present.     Comments: Is able to move all extremities 2+ bilateral lower extremity edema    Lymphadenopathy:     Cervical: No cervical adenopathy.  Skin:    General: Skin is warm and dry.     Comments: History of left mastectomy  Bilateral lower extremities discolored   Neurological:     Mental Status: She is alert. Mental status is at baseline.  Psychiatric:        Mood and Affect: Mood normal.     ASSESSMENT/ PLAN:   TODAY;   1. Late onset alzheimer's disease without behavioral disturbance.  2. CKD stage 4 due to type 2 diabetes mellitus  3. Controlled type 2 diabetes mellitus with stage 4 chronic kidney disease without long term current use of insulin  Will continue her current medication regimen Will continue her current plan of care Will monitor her status.       MD is aware of resident's narcotic use and is in agreement with current plan of care. We will attempt to wean resident as apropriate   Ok Edwards NP [facility]  Contact [phone] Monday through Friday 8am- 5pm  After hours call [phone]

## 2018-06-16 DIAGNOSIS — E1122 Type 2 diabetes mellitus with diabetic chronic kidney disease: Secondary | ICD-10-CM | POA: Insufficient documentation

## 2018-06-16 DIAGNOSIS — G301 Alzheimer's disease with late onset: Secondary | ICD-10-CM

## 2018-06-16 DIAGNOSIS — N184 Chronic kidney disease, stage 4 (severe): Secondary | ICD-10-CM

## 2018-06-16 DIAGNOSIS — F0280 Dementia in other diseases classified elsewhere without behavioral disturbance: Secondary | ICD-10-CM | POA: Insufficient documentation

## 2018-06-16 DIAGNOSIS — I129 Hypertensive chronic kidney disease with stage 1 through stage 4 chronic kidney disease, or unspecified chronic kidney disease: Secondary | ICD-10-CM

## 2018-06-16 DIAGNOSIS — E1142 Type 2 diabetes mellitus with diabetic polyneuropathy: Secondary | ICD-10-CM | POA: Insufficient documentation

## 2018-06-19 ENCOUNTER — Encounter (HOSPITAL_COMMUNITY)
Admission: RE | Admit: 2018-06-19 | Discharge: 2018-06-19 | Disposition: A | Discharge status: Home / Self Care | Payer: Medicare Other | Source: Skilled Nursing Facility | Attending: Adult Health | Admitting: Adult Health

## 2018-06-19 DIAGNOSIS — G629 Polyneuropathy, unspecified: Secondary | ICD-10-CM | POA: Diagnosis present

## 2018-06-19 DIAGNOSIS — R600 Localized edema: Secondary | ICD-10-CM | POA: Diagnosis present

## 2018-06-19 DIAGNOSIS — I1310 Hypertensive heart and chronic kidney disease without heart failure, with stage 1 through stage 4 chronic kidney disease, or unspecified chronic kidney disease: Secondary | ICD-10-CM | POA: Insufficient documentation

## 2018-06-19 LAB — BASIC METABOLIC PANEL
Anion gap: 13 (ref 5–15)
BUN: 53 mg/dL — AB (ref 8–23)
CALCIUM: 8.8 mg/dL — AB (ref 8.9–10.3)
CO2: 26 mmol/L (ref 22–32)
CREATININE: 2.3 mg/dL — AB (ref 0.44–1.00)
Chloride: 99 mmol/L (ref 98–111)
GFR calc Af Amer: 20 mL/min — ABNORMAL LOW (ref >60)
GFR, EST NON AFRICAN AMERICAN: 18 mL/min — AB (ref >60)
GLUCOSE: 85 mg/dL (ref 70–99)
Potassium: 4.4 mmol/L (ref 3.5–5.1)
Sodium: 138 mmol/L (ref 135–145)

## 2018-06-29 ENCOUNTER — Encounter: Payer: Self-pay | Admitting: Internal Medicine

## 2018-06-29 ENCOUNTER — Non-Acute Institutional Stay (SKILLED_NURSING_FACILITY): Payer: Medicare Other | Admitting: Internal Medicine

## 2018-06-29 DIAGNOSIS — E1122 Type 2 diabetes mellitus with diabetic chronic kidney disease: Secondary | ICD-10-CM

## 2018-06-29 DIAGNOSIS — N184 Chronic kidney disease, stage 4 (severe): Secondary | ICD-10-CM

## 2018-06-29 DIAGNOSIS — R600 Localized edema: Secondary | ICD-10-CM

## 2018-06-29 DIAGNOSIS — R21 Rash and other nonspecific skin eruption: Secondary | ICD-10-CM

## 2018-06-29 DIAGNOSIS — I83029 Varicose veins of left lower extremity with ulcer of unspecified site: Secondary | ICD-10-CM | POA: Diagnosis not present

## 2018-06-29 DIAGNOSIS — L97929 Non-pressure chronic ulcer of unspecified part of left lower leg with unspecified severity: Secondary | ICD-10-CM

## 2018-06-29 NOTE — Progress Notes (Signed)
Location:    [facility] Room Number: 149/W Place of Service:  SNF [phone]) Provider: Veleta Miners MD  Brandi Dad, MD  Patient Care Team: Brandi Dad, MD as PCP - General (Internal Medicine) Brandi Fee, NP as Nurse Practitioner (Geriatric Medicine)  Extended Emergency Contact Information Primary Emergency Contact: Brandi Rice Address: [place], [place] Phone: [phone] Work Phone: [phone] Relation: Daughter  Code Status:  DNR Goals of care: Advanced Directive information Advanced Directives 06/29/2018  Does Patient Have a Medical Advance Directive? Yes  Type of Advance Directive Out of facility DNR (pink MOST or yellow form)  Does patient want to make changes to medical advance directive? No - Patient declined  Copy of Brandi Rice in Chart? -     Chief Complaint  Patient presents with  . Acute Visit    Rash    HPI:  Pt is a 85 y.o. female seen today for an acute visit for Rash , Worsening LE Edema and Blisters on her Leg  Patient has H/O Dementia, Diabetes Type 2, LE Edema,Hypothyroidism, Peripheral vascular disease and Chronic Venous stasis, Hyperlipidemia. GERD with Esophagitis.and Depression. Patient was recently started on Tradjenta and glipizide for her high A1c. She has also been on Lasix for lower extremity edema. Nurses wanted me to see her today as they had noticed some rash all over her body to fit in her back and her upper extremities.  Patient seems like it has been itching.  Patient is unable to give any history she says she is fine and has no issues. Patient also continues to have lower extremity edema which is getting worse. She now has a blister in her left lower extremity which is leaking. Denies any chest pain cough or shortness of breath.  She does have dementia.  And has had worsening in past few months.  Her appetite stays good  She  has lost some weight on increased dose of Lasix.     Past Medical History:  Diagnosis Date  . Acute cystitis   . Dementia ([place])   . Dermatomycosis, unspecified   . DJD (degenerative joint disease)    Spine  . DM (diabetes mellitus) ([place])   . GERD (gastroesophageal reflux disease)   . History of pulmonary embolism   . History of supraventricular tachycardia   . Hyperlipidemia   . Hypertension   . Hypothyroidism   . Long term (current) use of anticoagulants    coumadin  . Obesity   . Other malaise and fatigue   . PVD (peripheral vascular disease) ([place])    Past Surgical History:  Procedure Laterality Date  . ATRIAL ABLATION SURGERY  01/2003   Catheter, to tachic palpitations  . ESOPHAGOGASTRODUODENOSCOPY N/A 10/09/2017   Procedure: ESOPHAGOGASTRODUODENOSCOPY (EGD);  Surgeon: Rogene Houston, MD;  [facility];  Service: Endoscopy;  Laterality: N/A;  2:50  . MASTECTOMY  2003   Left  . THYROIDECTOMY, PARTIAL  1962    Allergies  Allergen Reactions  . Contrast Media [Iodinated Diagnostic Agents] Other (See Comments)    Unknown-listed on MAR-patient is not aware of these allergies  . Latex Other (See Comments)    Unknown-listed on MAR-patient is not aware of these allergies    Outpatient Encounter Medications as of 06/29/2018  Medication Sig  . acetaminophen (TYLENOL) 325 MG tablet Take 650 mg by mouth 2 (two) times daily.  Marland Kitchen  aspirin 81 MG chewable tablet Chew 81 mg by mouth daily.  Brandi Rice [place]-Castor Oil (VENELEX) OINT Apply 1 application topically See admin instructions. Apply to sacrum and bilateral buttocks each shift and prn for prevention   . cilostazol (PLETAL) 50 MG tablet Take 50 mg by mouth 2 (two) times daily.    Marland Kitchen donepezil (ARICEPT) 10 MG tablet Take 5 mg by mouth at bedtime.   Marland Kitchen escitalopram (LEXAPRO) 5 MG tablet Take 5 mg by mouth at bedtime.   . furosemide (LASIX) 40 MG tablet Take 40 mg by mouth 2 (two) times daily.   Marland Kitchen gabapentin (NEURONTIN)  300 MG capsule Take 300 mg by mouth 2 (two) times daily.    Marland Kitchen glipiZIDE (GLUCOTROL XL) 2.5 MG 24 hr tablet Take 2.5 mg by mouth daily with breakfast.  . levothyroxine (SYNTHROID, LEVOTHROID) 50 MCG tablet Take 50 mcg by mouth daily.    Marland Kitchen linagliptin (TRADJENTA) 5 MG TABS tablet Take 5 mg by mouth daily.  . memantine (NAMENDA) 5 MG tablet Take 5 mg by mouth 2 (two) times daily.   . Multiple Vitamins-Minerals (MULTIVITAMIN WITH MINERALS) tablet Take 1 tablet by mouth daily.  . NON FORMULARY Diet Type:  Consistent CHO Diet  . ranitidine (ZANTAC) 150 MG capsule Take 1 capsule (150 mg total) by mouth 2 (two) times daily.   No facility-administered encounter medications on file as of 06/29/2018.      Review of Systems  Constitutional: Negative.   HENT: Negative.   Respiratory: Negative.   Cardiovascular: Positive for leg swelling.  Gastrointestinal: Negative.   Genitourinary: Negative.   Musculoskeletal: Negative.   Skin: Positive for rash and wound.  Neurological: Negative.   Psychiatric/Behavioral: Negative.     Immunization History  Administered Date(s) Administered  . Influenza Whole 10/11/2006  . Influenza-Unspecified 03/19/2009, 12/30/2016  . Pneumococcal Conjugate-13 12/30/2016  . Td 01/14/2003  . Tdap 12/15/2016   Pertinent  Health Maintenance Due  Topic Date Due  . PNA vac Low Risk Adult (2 of 2 - PPSV23) 05/02/2019 (Originally 12/30/2017)  . FOOT EXAM  09/26/2018  . INFLUENZA VACCINE  10/27/2018  . HEMOGLOBIN A1C  11/16/2018  . URINE MICROALBUMIN  12/30/2018  . OPHTHALMOLOGY EXAM  01/31/2019  . DEXA SCAN  Completed   Fall Risk  12/28/2017 12/15/2016  Falls in the past year? No No   Functional Status Survey:    There were no vitals filed for this visit. There is no height or weight on file to calculate BMI. Physical Exam Vitals signs reviewed.  Constitutional:      Appearance: Normal appearance.  HENT:     Head: Normocephalic.     Nose: Nose normal.      Mouth/Throat:     Mouth: Mucous membranes are moist.     Pharynx: Oropharynx is clear.  Eyes:     Pupils: Pupils are equal, round, and reactive to light.  Neck:     Musculoskeletal: Neck supple.  Cardiovascular:     Rate and Rhythm: Normal rate and regular rhythm.     Pulses: Normal pulses.     Heart sounds: Normal heart sounds.  Pulmonary:     Effort: Pulmonary effort is normal. No respiratory distress.     Breath sounds: Normal breath sounds. No wheezing.  Abdominal:     General: Abdomen is flat. Bowel sounds are normal. There is no distension.     Palpations: Abdomen is soft.     Tenderness: There is no abdominal tenderness. There is  no guarding.  Musculoskeletal:     Comments: Moderate Edema Bilateral with Clear wound on her Left LE. Base is clear Has Blisters in Right LE also  Skin:    Comments: Has small vesicles all over her body especially in her back and Extremties  Neurological:     General: No focal deficit present.     Mental Status: She is alert.  Psychiatric:        Mood and Affect: Mood normal.        Thought Content: Thought content normal.        Judgment: Judgment normal.     Labs reviewed: Recent Labs    06/09/18 0900 06/13/18 0733 06/19/18 0600  NA 139 137 138  K 4.2 4.1 4.4  CL 97* 95* 99  CO2 34* 32 26  GLUCOSE 123* 117* 85  BUN 43* 45* 53*  CREATININE 2.16* 2.07* 2.30*  CALCIUM 8.8* 9.0 8.8*   Recent Labs    05/18/18 0700  AST 23  ALT 14  ALKPHOS 74  BILITOT 0.9  PROT 7.3  ALBUMIN 3.1*   Recent Labs    09/11/17 0719 12/29/17 0700 05/18/18 0700  WBC 4.7 4.5 4.8  NEUTROABS 2.5 2.5 3.0  HGB 12.6 12.4 12.6  HCT 42.1 41.0 44.4  MCV 82.5 86.0 85.4  PLT 183 221 236   Lab Results  Component Value Date   TSH 1.544 05/18/2018   Lab Results  Component Value Date   HGBA1C 8.2 (H) 05/18/2018   Lab Results  Component Value Date   CHOL 150 11/26/2016   HDL 45 11/26/2016   LDLCALC 85 11/26/2016   TRIG 99 11/26/2016   CHOLHDL  3.3 11/26/2016    Significant Diagnostic Results in last 30 days:  No results found.  Assessment/Plan Lower extremity edema with now Blisters and Drainage Will restart her on Metolazone 2.5 mg QD for 3 days and then 3/week Repeat BMP in few days LE blisters D/W Wound care nurse Will Use Xeroform and light Wrapping of her legs Rash Not sure about the origin but it seems it has been going on for few days and probably go unnoticed as patient doe snot complain Will Discontinue Tradjenta as it is new drug for her Also will start her on Prednisone 10 mg for 5 days  If not better consider discontinuing Glipizide Diabetes Mellitus Will continue Glipizide  Follow BS closely  Other issues are stable  Essential hypertension BP stable Peripheral vascular disease Onaspirin Also on [place] Statin Hypothyroidism TSH normal in02/20  Alzheimer's dementia Stable on Aricept and Namenda Depression Will Continue Lexapro Remeron discontinued due to weight gain Hyperlipidemia Continue pravastatin. LDL 85 in 09/18  GERDWith Esophagitis Per EGD On H2 blockers    DEXA scan was Tscore -.1.Marland Kitchen12 in 08/18     Family/ staff Communication:   Labs/tests ordered:  BMP Total time spent in this patient care encounter was 45_ minutes; greater than 50% of the visit spent d/w Nurses and  reviewing records , Labs and coordinating care for problems addressed at this encounter.

## 2018-06-30 ENCOUNTER — Encounter: Payer: Self-pay | Admitting: Internal Medicine

## 2018-06-30 DIAGNOSIS — R21 Rash and other nonspecific skin eruption: Secondary | ICD-10-CM | POA: Insufficient documentation

## 2018-06-30 DIAGNOSIS — L97929 Non-pressure chronic ulcer of unspecified part of left lower leg with unspecified severity: Secondary | ICD-10-CM

## 2018-06-30 DIAGNOSIS — I83029 Varicose veins of left lower extremity with ulcer of unspecified site: Secondary | ICD-10-CM | POA: Insufficient documentation

## 2018-07-02 ENCOUNTER — Non-Acute Institutional Stay (SKILLED_NURSING_FACILITY): Payer: Medicare Other | Admitting: Internal Medicine

## 2018-07-02 ENCOUNTER — Encounter: Payer: Self-pay | Admitting: Internal Medicine

## 2018-07-02 ENCOUNTER — Encounter (HOSPITAL_COMMUNITY)
Admission: RE | Admit: 2018-07-02 | Discharge: 2018-07-02 | Disposition: A | Discharge status: Home / Self Care | Payer: Medicare Other | Source: Skilled Nursing Facility | Attending: Internal Medicine | Admitting: Internal Medicine

## 2018-07-02 DIAGNOSIS — I10 Essential (primary) hypertension: Secondary | ICD-10-CM | POA: Insufficient documentation

## 2018-07-02 DIAGNOSIS — I83029 Varicose veins of left lower extremity with ulcer of unspecified site: Secondary | ICD-10-CM

## 2018-07-02 DIAGNOSIS — L97929 Non-pressure chronic ulcer of unspecified part of left lower leg with unspecified severity: Secondary | ICD-10-CM | POA: Diagnosis not present

## 2018-07-02 DIAGNOSIS — N289 Disorder of kidney and ureter, unspecified: Secondary | ICD-10-CM

## 2018-07-02 DIAGNOSIS — R600 Localized edema: Secondary | ICD-10-CM | POA: Diagnosis not present

## 2018-07-02 DIAGNOSIS — F329 Major depressive disorder, single episode, unspecified: Secondary | ICD-10-CM | POA: Insufficient documentation

## 2018-07-02 DIAGNOSIS — N189 Chronic kidney disease, unspecified: Secondary | ICD-10-CM

## 2018-07-02 DIAGNOSIS — G629 Polyneuropathy, unspecified: Secondary | ICD-10-CM | POA: Insufficient documentation

## 2018-07-02 LAB — BASIC METABOLIC PANEL
Anion gap: 14 (ref 5–15)
BUN: 85 mg/dL — ABNORMAL HIGH (ref 8–23)
CO2: 31 mmol/L (ref 22–32)
Calcium: 9.3 mg/dL (ref 8.9–10.3)
Chloride: 95 mmol/L — ABNORMAL LOW (ref 98–111)
Creatinine, Ser: 3.42 mg/dL — ABNORMAL HIGH (ref 0.44–1.00)
GFR calc Af Amer: 13 mL/min — ABNORMAL LOW (ref >60)
GFR calc non Af Amer: 11 mL/min — ABNORMAL LOW (ref >60)
Glucose, Bld: 102 mg/dL — ABNORMAL HIGH (ref 70–99)
Potassium: 3.6 mmol/L (ref 3.5–5.1)
Sodium: 140 mmol/L (ref 135–145)

## 2018-07-02 NOTE — Progress Notes (Signed)
Location:  [facility] Room Number: [place] of Service:  SNF [phone]) Provider:  Veleta Miners, MD  [name], MD  Patient Care Team: [name], MD as PCP - General (Internal Medicine) [name], NP as Nurse Practitioner (Geriatric Medicine)  Extended Emergency Contact Information Primary Emergency Contact: Valarie Merino Address: [address] [place], [place] Phone: [phone] Work Phone: [phone] Relation: Daughter  Code Status:  DNR Goals of care: Advanced Directive information Advanced Directives 06/29/2018  Does Patient Have a Medical Advance Directive? Yes  Type of Advance Directive Out of facility DNR (pink MOST or yellow form)  Does patient want to make changes to medical advance directive? No - Patient declined  Copy of [place] in Chart? -     Chief Complaint  Patient presents with  . Acute Visit    Acute Renal Insuffiency    HPI:  Pt is a 85 y.o. female seen today for an acute visit for Worsening Renal Insufficiency Patient has H/O Dementia, Diabetes Type 2, LE Edema,Hypothyroidism, Peripheral vascular disease and Chronic Venous stasis, Hyperlipidemia. GERD with Esophagitis.and Depression. Patient was seen today due to worsening of her Renal Function and also LE Edema with Blisters. She also has rash with Itching. Patient was seen on Fri due to her LE edema and Blisters I had started her on Metolazone with her lasix Today her edema is better . She has lost few pounds since then But now she has Blister on her right leg also She also continues to have itching Denies any SOB or Cough. She always denies any complains     Past Medical History:  Diagnosis Date  . Acute cystitis   . Dementia ([place])   . Dermatomycosis, unspecified   . DJD (degenerative joint disease)    Spine  . DM (diabetes mellitus) ([place])   . GERD (gastroesophageal reflux  disease)   . History of pulmonary embolism   . History of supraventricular tachycardia   . Hyperlipidemia   . Hypertension   . Hypothyroidism   . Long term (current) use of anticoagulants    coumadin  . Obesity   . Other malaise and fatigue   . PVD (peripheral vascular disease) ([place])    Past Surgical History:  Procedure Laterality Date  . ATRIAL ABLATION SURGERY  01/2003   Catheter, to tachic palpitations  . ESOPHAGOGASTRODUODENOSCOPY N/A 10/01/2017   Procedure: ESOPHAGOGASTRODUODENOSCOPY (EGD);  Surgeon: Rogene Houston, MD;  [facility];  Service: Endoscopy;  Laterality: N/A;  2:50  . MASTECTOMY  2003   Left  . THYROIDECTOMY, PARTIAL  1962    Allergies  Allergen Reactions  . Contrast Media [Iodinated Diagnostic Agents] Other (See Comments)    Unknown-listed on MAR-patient is not aware of these allergies  . Latex Other (See Comments)    Unknown-listed on MAR-patient is not aware of these allergies    Outpatient Encounter Medications as of 07/02/2018  Medication Sig  . acetaminophen (TYLENOL) 325 MG tablet Take 650 mg by mouth 2 (two) times daily.  Marland Kitchen aspirin 81 MG chewable tablet Chew 81 mg by mouth daily.  Roseanne Kaufman Peru-Castor Oil (VENELEX) OINT Apply 1 application topically See admin instructions. Apply to sacrum and bilateral buttocks each shift and prn for prevention   . cilostazol (PLETAL) 50 MG tablet Take 50 mg by mouth 2 (two) times daily.    Marland Kitchen  escitalopram (LEXAPRO) 5 MG tablet Take 5 mg by mouth at bedtime.   . furosemide (LASIX) 40 MG tablet Take 40 mg by mouth daily.   Marland Kitchen gabapentin (NEURONTIN) 300 MG capsule Take 300 mg by mouth 2 (two) times daily.    Marland Kitchen levothyroxine (SYNTHROID, LEVOTHROID) 50 MCG tablet Take 50 mcg by mouth daily.    . memantine (NAMENDA) 5 MG tablet Take 5 mg by mouth 2 (two) times daily.   . Multiple Vitamins-Minerals (MULTIVITAMIN WITH MINERALS) tablet Take 1 tablet by mouth daily.  . NON FORMULARY Diet Type:  Consistent CHO Diet   . predniSONE (DELTASONE) 10 MG tablet Take 10 mg by mouth daily with breakfast. X 5 days, ending on 07/04/18  . ranitidine (ZANTAC) 150 MG capsule Take 1 capsule (150 mg total) by mouth 2 (two) times daily.  . [DISCONTINUED] donepezil (ARICEPT) 10 MG tablet Take 5 mg by mouth at bedtime.   . [DISCONTINUED] glipiZIDE (GLUCOTROL XL) 2.5 MG 24 hr tablet Take 2.5 mg by mouth daily with breakfast.  . [DISCONTINUED] linagliptin (TRADJENTA) 5 MG TABS tablet Take 5 mg by mouth daily.   No facility-administered encounter medications on file as of 07/02/2018.     Review of Systems  Constitutional: Negative.   HENT: Negative.   Respiratory: Negative.   Cardiovascular: Positive for leg swelling.  Gastrointestinal: Negative.   Genitourinary: Negative.   Musculoskeletal: Negative.   Skin: Positive for rash and wound.  Neurological: Negative.   Psychiatric/Behavioral: Negative.     Immunization History  Administered Date(s) Administered  . Influenza Whole 10/11/2006  . Influenza-Unspecified 03/19/2009, 12/30/2016  . Pneumococcal Conjugate-13 12/30/2016  . Td 01/14/2003  . Tdap 12/15/2016   Pertinent  Health Maintenance Due  Topic Date Due  . PNA vac Low Risk Adult (2 of 2 - PPSV23) 05/02/2019 (Originally 12/30/2017)  . FOOT EXAM  09/26/2018  . INFLUENZA VACCINE  10/27/2018  . HEMOGLOBIN A1C  11/16/2018  . URINE MICROALBUMIN  12/30/2018  . OPHTHALMOLOGY EXAM  01/31/2019  . DEXA SCAN  Completed   Fall Risk  12/28/2017 12/15/2016  Falls in the past year? No No   Functional Status Survey:    Vitals:   07/02/18 1223  BP: 109/70  Pulse: (!) 101  Resp: 16  Temp: 98.2 F (36.8 C)  SpO2: 99%  Weight: 225 lb (102.1 kg)  Height: 5\' 9"  (1.753 m)   Body mass index is 33.23 kg/m. Physical Exam Vitals signs reviewed.  Constitutional:      Appearance: Normal appearance.  HENT:     Head: Normocephalic.     Nose: Nose normal.     Mouth/Throat:     Mouth: Mucous membranes are moist.      Pharynx: Oropharynx is clear.  Eyes:     Pupils: Pupils are equal, round, and reactive to light.  Neck:     Musculoskeletal: Neck supple.  Cardiovascular:     Rate and Rhythm: Normal rate and regular rhythm.     Pulses: Normal pulses.     Heart sounds: Normal heart sounds.  Pulmonary:     Effort: Pulmonary effort is normal. No respiratory distress.     Breath sounds: Normal breath sounds. No wheezing.  Abdominal:     General: Abdomen is flat. Bowel sounds are normal. There is no distension.     Palpations: Abdomen is soft.     Tenderness: There is no abdominal tenderness. There is no guarding.  Musculoskeletal:     Comments: Moderate Edema Bilateral with  Clear wound on her Left LE. Base is clear Has Blisters in Right LE also  Skin:    Comments: Has small vesicles all over her body especially in her back and Extremties  Neurological:     General: No focal deficit present.     Mental Status: She is alert.  Psychiatric:        Mood and Affect: Mood normal.        Thought Content: Thought content normal.        Judgment: Judgment normal.     Labs reviewed: Recent Labs    06/13/18 0733 06/19/18 0600 07/02/18 0548  NA 137 138 140  K 4.1 4.4 3.6  CL 95* 99 95*  CO2 32 26 31  GLUCOSE 117* 85 102*  BUN 45* 53* 85*  CREATININE 2.07* 2.30* 3.42*  CALCIUM 9.0 8.8* 9.3   Recent Labs    05/18/18 0700  AST 23  ALT 14  ALKPHOS 74  BILITOT 0.9  PROT 7.3  ALBUMIN 3.1*   Recent Labs    09/11/17 0719 12/29/17 0700 05/18/18 0700  WBC 4.7 4.5 4.8  NEUTROABS 2.5 2.5 3.0  HGB 12.6 12.4 12.6  HCT 42.1 41.0 44.4  MCV 82.5 86.0 85.4  PLT 183 221 236   Lab Results  Component Value Date   TSH 1.544 05/18/2018   Lab Results  Component Value Date   HGBA1C 8.2 (H) 05/18/2018   Lab Results  Component Value Date   CHOL 150 11/26/2016   HDL 45 11/26/2016   LDLCALC 85 11/26/2016   TRIG 99 11/26/2016   CHOLHDL 3.3 11/26/2016    Significant Diagnostic Results in last 30  days:  No results found.  Assessment/Plan Acute renal Insufficiency Most likely due to aggressive Diuresis Will stop her Metolazone Continue on Lasix 40 mg QD Repeat Bmp in few days  LE edema with Blisters Edema somewhat better Continue Xeroform dressing Bilateral Try to keep her legs elevated Rash Not sure about the origin but it seems it has been going on for few days and probably go unnoticed as patient does not complain Will discontinue Glipizide . [place] was already discontinued due to Rash ACP Patient daughter was informed of worsening clinically of patient especially in light of these new Blisters She doe snot want any aggressive treatment for patient  At this time will also discontinue her Aricept and Programmer, applications Communication: her Daughter  Labs/tests ordered:  BMP and CBC

## 2018-07-03 ENCOUNTER — Encounter: Payer: Self-pay | Admitting: Internal Medicine

## 2018-07-03 ENCOUNTER — Non-Acute Institutional Stay (SKILLED_NURSING_FACILITY): Payer: Medicare Other | Admitting: Internal Medicine

## 2018-07-03 ENCOUNTER — Inpatient Hospital Stay (HOSPITAL_COMMUNITY): Payer: Medicare Other | Attending: Internal Medicine

## 2018-07-03 DIAGNOSIS — E1122 Type 2 diabetes mellitus with diabetic chronic kidney disease: Secondary | ICD-10-CM | POA: Diagnosis not present

## 2018-07-03 DIAGNOSIS — N184 Chronic kidney disease, stage 4 (severe): Secondary | ICD-10-CM | POA: Diagnosis not present

## 2018-07-03 DIAGNOSIS — R0902 Hypoxemia: Secondary | ICD-10-CM | POA: Diagnosis not present

## 2018-07-03 DIAGNOSIS — R531 Weakness: Secondary | ICD-10-CM

## 2018-07-03 NOTE — Progress Notes (Signed)
Location:   [facility] Room Number: [place] of Service:  SNF [phone]) Provider:  Freddi Starr, MD  Patient Care Team: [name], MD as PCP - General (Internal Medicine) [name], NP as Nurse Practitioner (Geriatric Medicine)  Extended Emergency Contact Information Primary Emergency Contact: Valarie Merino Address: [place], [place] Phone: [phone] Work Phone: [phone] Relation: Daughter  Code Status:  DNR Goals of care: Advanced Directive information Advanced Directives 07/03/2018  Does Patient Have a Medical Advance Directive? Yes  Type of Advance Directive Out of facility DNR (pink MOST or yellow form)  Does patient want to make changes to medical advance directive? No - Patient declined  Copy of [place] in Chart? -  Pre-existing out of facility DNR order (yellow form or pink MOST form) Yellow form placed in chart (order not valid for inpatient use)     Chief Complaint  Patient presents with   Acute Visit    Transitory Hypoxia    HPI:  Pt is a 85 y.o. female seen today for an acute visit for apparently transitory hypoxia.  Patient is a long-term resident of facility with a history of worsening renal insufficiency she also has a history of dementia type 2 diabetes lower extremity edema hypothyroidism peripheral vascular disease and chronic venous stasis as well as hyperlipidemia and GERD with esophagitis and depression.  She was seen yesterday for worsening of her renal function creatinine rising to 3.42 also lower extremity edema with blisters and a rash with itching.  She had been started on metolazone with her Lasix for her edema and edema appear to be improved yesterday and her metolazone was discontinued and Lasix was decreased to once a day 40 mg a day.  Apparently she has had some transitory hypoxia with oxygen  saturations in the low 80s that come up with oxygen into the 90s.  She is somewhat of a poor historian but when I talked with her today she did not complain of any shortness of breath   She was resting in bed comfortably appeared to be weaker than when I saw her several weeks ago.  Vital signs were significant for an O2 saturation around 80 appears her oxygen was off when I did replace it it it rose to the low 90s area     Past Medical History:  Diagnosis Date   Acute cystitis    Dementia (HCC)    Dermatomycosis, unspecified    DJD (degenerative joint disease)    Spine   DM (diabetes mellitus) (HCC)    GERD (gastroesophageal reflux disease)    History of pulmonary embolism    History of supraventricular tachycardia    Hyperlipidemia    Hypertension    Hypothyroidism    Long term (current) use of anticoagulants    coumadin   Obesity    Other malaise and fatigue    PVD (peripheral vascular disease) ([place])    Past Surgical History:  Procedure Laterality Date   ATRIAL ABLATION SURGERY  01/2003   Catheter, to tachic palpitations   ESOPHAGOGASTRODUODENOSCOPY N/A 10/14/2017   Procedure: ESOPHAGOGASTRODUODENOSCOPY (EGD);  Surgeon: Rogene Houston, MD;  [facility];  Service: Endoscopy;  Laterality: N/A;  2:50   MASTECTOMY  2003   Left   THYROIDECTOMY, PARTIAL  1962    Allergies  Allergen Reactions   Contrast  Media [Iodinated Diagnostic Agents] Other (See Comments)    Unknown-listed on MAR-patient is not aware of these allergies   Latex Other (See Comments)    Unknown-listed on MAR-patient is not aware of these allergies    Outpatient Encounter Medications as of 07/03/2018  Medication Sig   acetaminophen (TYLENOL) 325 MG tablet Take 650 mg by mouth 2 (two) times daily.   aspirin 81 MG chewable tablet Chew 81 mg by mouth daily.   Balsam [place]-Castor Oil (VENELEX) OINT Apply 1 application topically See admin instructions. Apply to sacrum and  bilateral buttocks each shift and prn for prevention    cilostazol (PLETAL) 50 MG tablet Take 50 mg by mouth 2 (two) times daily.     escitalopram (LEXAPRO) 5 MG tablet Take 5 mg by mouth at bedtime.    furosemide (LASIX) 40 MG tablet Take 40 mg by mouth daily.    gabapentin (NEURONTIN) 300 MG capsule Take 300 mg by mouth 2 (two) times daily.     levothyroxine (SYNTHROID, LEVOTHROID) 50 MCG tablet Take 50 mcg by mouth daily.     memantine (NAMENDA) 5 MG tablet Take 5 mg by mouth 2 (two) times daily.    Multiple Vitamins-Minerals (MULTIVITAMIN WITH MINERALS) tablet Take 1 tablet by mouth daily.   NON FORMULARY Diet Type:  Consistent CHO Diet   OXYGEN Inhale 2 L/min into the lungs continuous. Titrate as needed to maintain saturation > 90%   predniSONE (DELTASONE) 10 MG tablet Take 10 mg by mouth daily with breakfast. X 5 days, ending on 07/04/18   ranitidine (ZANTAC) 150 MG capsule Take 1 capsule (150 mg total) by mouth 2 (two) times daily.   No facility-administered encounter medications on file as of 07/03/2018.     Review of Systems  In general she is denying any fever or chills.  Says she feels all right.  Skin does have a history of blisters legs that are currently covered being treated by wound care is not complaining of itching today.  Head ears eyes nose mouth and throat does not complain of visual changes or sore throat.  Respiratory she denies being short of breath or having a cough she has had some hypoxia it appears off oxygen.  Cardiac does not complain of chest pain continues to have some edema apparently this has improved however with the aggressive diuretics.  GI is not complaining of abdominal pain nausea vomiting diarrhea constipation.  Musculoskeletal does not complain of joint pain appears to be generally weaker than when I saw her several weeks ago.  Neurologic positive for weakness does not complain of headache dizziness or syncope.   psych she continues  to be pleasant- is not noted any increased anxiety or behaviors  Immunization History  Administered Date(s) Administered   Influenza Whole 10/11/2006   Influenza-Unspecified 03/19/2009, 12/30/2016   Pneumococcal Conjugate-13 12/30/2016, 06/22/2018   Td 01/14/2003   Tdap 12/15/2016   Pertinent  Health Maintenance Due  Topic Date Due   PNA vac Low Risk Adult (2 of 2 - PPSV23) 05/02/2019 (Originally 12/30/2017)   FOOT EXAM  09/26/2018   INFLUENZA VACCINE  10/27/2018   HEMOGLOBIN A1C  11/16/2018   URINE MICROALBUMIN  12/30/2018   OPHTHALMOLOGY EXAM  01/31/2019   DEXA SCAN  Completed   Fall Risk  12/28/2017 12/15/2016  Falls in the past year? No No   Functional Status Survey:    Vitals:   07/03/18 1513  BP: 102/73  Pulse: (!) 101  Resp: 16  Temp: Marland Kitchen)  96.6 F (35.9 C)  Weight: 224 lb 6.4 oz (101.8 kg)  Height: 5\' 9"  (1.753 m)  Oxygen saturation initially around 80 did rise to the low 90s on 2 L of oxygen Body mass index is 33.14 kg/m. Physical Exam   In general this is a pleasant elderly female in no distress she was sleeping when I saw her but was easily arousable.  Does appear to be weaker than when I have seen her previously  Her skin is warm and dry there is covering over her shins bilaterally  Eyes visual Acuity appears to be intact sclera and conjunctive are clear  Oropharynx clear mucous membranes moist.  Chest she does have some mild wheezing more anterior lung fields there is no labored breathing she is able to speak in full sentences  Heart is regular rate and rhythm with occasional irregular beats she has moderate lower extremity edema bilaterally.  Abdomen is somewhat obese soft nontender with positive bowel sounds.  Musculoskeletal Limited exam since she is in bed but appears able to move her extremities x4 with generalized weakness   Neurologic is grossly intact she is alert could not appreciate lateralizing findings.  Psych she is oriented  to self appears to have her baseline personality but appears weaker than when I have seen her previously several weeks ago    Labs reviewed: Recent Labs    06/13/18 0733 06/19/18 0600 07/02/18 0548  NA 137 138 140  K 4.1 4.4 3.6  CL 95* 99 95*  CO2 32 26 31  GLUCOSE 117* 85 102*  BUN 45* 53* 85*  CREATININE 2.07* 2.30* 3.42*  CALCIUM 9.0 8.8* 9.3   Recent Labs    05/18/18 0700  AST 23  ALT 14  ALKPHOS 74  BILITOT 0.9  PROT 7.3  ALBUMIN 3.1*   Recent Labs    09/11/17 0719 12/29/17 0700 05/18/18 0700  WBC 4.7 4.5 4.8  NEUTROABS 2.5 2.5 3.0  HGB 12.6 12.4 12.6  HCT 42.1 41.0 44.4  MCV 82.5 86.0 85.4  PLT 183 221 236   Lab Results  Component Value Date   TSH 1.544 05/18/2018   Lab Results  Component Value Date   HGBA1C 8.2 (H) 05/18/2018   Lab Results  Component Value Date   CHOL 150 11/26/2016   HDL 45 11/26/2016   LDLCALC 85 11/26/2016   TRIG 99 11/26/2016   CHOLHDL 3.3 11/26/2016    Significant Diagnostic Results in last 30 days:  No results found.  Assessment/Plan  #1 transitory hypoxia that appears to rise with oxygen- this was discussed with Dr. Lyndel Safe via phone who is very familiar with her-we will order a stat chest x-ray in the hospital also will give her an extra dose of Lasix 40 mg-to make 40 mg twice daily today.  Also monitor vital signs pulse ox every 4 hours and continue supplemental oxygen to keep stats 90 or above.  Also will add PRN DuoNeb's every 6 hours as needed.  Currently there is no sign of distress but she does appear to be getting weaker and declining.  2.  History of worsening renal insufficiency with creatinine rising to 3.42 yesterday BUN of 85- again her diuretics were reduced but we are giving her extra dose of Lasix today because of her possible increased chest congestion- with some hypoxia  .  [id number]

## 2018-07-04 ENCOUNTER — Encounter: Payer: Self-pay | Admitting: Internal Medicine

## 2018-07-04 NOTE — Progress Notes (Deleted)
Location:  [facility] Room Number: [place] of Service:  SNF [phone]) Provider:  Granville Lewis, PA-C  Brandi Dad, MD  Patient Care Team: Brandi Dad, MD as PCP - General (Internal Medicine) Brandi Fee, NP as Nurse Practitioner (Geriatric Medicine)  Extended Emergency Contact Information Primary Emergency Contact: Valarie Merino Address: [place], [place] of [place] Phone: [phone] Work Phone: [phone] Relation: Daughter  Code Status:  DNR (copy in [place]) Goals of care: Advanced Directive information Advanced Directives 07/03/2018  Does Patient Have a Medical Advance Directive? Yes  Type of Advance Directive Out of facility DNR (pink MOST or yellow form)  Does patient want to make changes to medical advance directive? No - Patient declined  Copy of [place] in Chart? -  Pre-existing out of facility DNR order (yellow form or pink MOST form) Yellow form placed in chart (order not valid for inpatient use)     Chief Complaint  Patient presents with  . Acute Visit    Follow up Hypoxia    HPI:  Pt is a 85 y.o. female seen today for an acute visit for    Past Medical History:  Diagnosis Date  . Acute cystitis   . Dementia ([place])   . Dermatomycosis, unspecified   . DJD (degenerative joint disease)    Spine  . DM (diabetes mellitus) ([place])   . GERD (gastroesophageal reflux disease)   . History of pulmonary embolism   . History of supraventricular tachycardia   . Hyperlipidemia   . Hypertension   . Hypothyroidism   . Long term (current) use of anticoagulants    coumadin  . Obesity   . Other malaise and fatigue   . PVD (peripheral vascular disease) ([place])    Past Surgical History:  Procedure Laterality Date  . ATRIAL ABLATION SURGERY  01/2003   Catheter, to tachic palpitations  . ESOPHAGOGASTRODUODENOSCOPY N/A 10/23/2017   Procedure: ESOPHAGOGASTRODUODENOSCOPY  (EGD);  Surgeon: Rogene Houston, MD;  [facility];  Service: Endoscopy;  Laterality: N/A;  2:50  . MASTECTOMY  2003   Left  . THYROIDECTOMY, PARTIAL  1962    Allergies  Allergen Reactions  . Contrast Media [Iodinated Diagnostic Agents] Other (See Comments)    Unknown-listed on MAR-patient is not aware of these allergies  . Latex Other (See Comments)    Unknown-listed on MAR-patient is not aware of these allergies    Outpatient Encounter Medications as of 07/04/2018  Medication Sig  . acetaminophen (TYLENOL) 325 MG tablet Take 650 mg by mouth 2 (two) times daily.  Marland Kitchen aspirin 81 MG chewable tablet Chew 81 mg by mouth daily.  Roseanne Kaufman Peru-Castor Oil (VENELEX) OINT Apply 1 application topically See admin instructions. Apply to sacrum and bilateral buttocks each shift and prn for prevention   . cilostazol (PLETAL) 50 MG tablet Take 50 mg by mouth 2 (two) times daily.    Marland Kitchen escitalopram (LEXAPRO) 5 MG tablet Take 5 mg by mouth at bedtime.   . furosemide (LASIX) 40 MG tablet Take 40 mg by mouth daily.   Marland Kitchen gabapentin (NEURONTIN) 300 MG capsule Take 300 mg by mouth 2 (two) times daily.    Marland Kitchen ipratropium (ATROVENT) 0.02 % nebulizer solution Take 0.5 mg by nebulization every 6 (six) hours as needed for wheezing or shortness of breath.  . levothyroxine (SYNTHROID, LEVOTHROID) 50 MCG tablet Take 50 mcg  by mouth daily.    . memantine (NAMENDA) 5 MG tablet Take 5 mg by mouth 2 (two) times daily.   . Multiple Vitamins-Minerals (MULTIVITAMIN WITH MINERALS) tablet Take 1 tablet by mouth daily.  . NON FORMULARY Diet Type:  Consistent CHO Diet  . OXYGEN Inhale 2 L/min into the lungs continuous. Titrate as needed to maintain saturation > 90%  . predniSONE (DELTASONE) 10 MG tablet Take 10 mg by mouth daily with breakfast. X 5 days, ending on 07/04/18  . ranitidine (ZANTAC) 150 MG capsule Take 1 capsule (150 mg total) by mouth 2 (two) times daily.   No facility-administered encounter medications on  file as of 07/04/2018.     Review of Systems  Immunization History  Administered Date(s) Administered  . Influenza Whole 10/11/2006  . Influenza-Unspecified 03/19/2009, 12/30/2016  . Pneumococcal Conjugate-13 12/30/2016, 06/22/2018  . Td 01/14/2003  . Tdap 12/15/2016   Pertinent  Health Maintenance Due  Topic Date Due  . FOOT EXAM  09/26/2018  . INFLUENZA VACCINE  10/27/2018  . HEMOGLOBIN A1C  11/16/2018  . URINE MICROALBUMIN  12/30/2018  . OPHTHALMOLOGY EXAM  01/31/2019  . PNA vac Low Risk Adult (2 of 2 - PPSV23) 06/22/2019  . DEXA SCAN  Completed   Fall Risk  12/28/2017 12/15/2016  Falls in the past year? No No   Functional Status Survey:    Vitals:   07/04/18 0857  BP: 113/69  Pulse: 75  Resp: 19  Temp: (!) 97.1 F (36.2 C)  SpO2: 98%  Weight: 224 lb 6.4 oz (101.8 kg)  Height: 5\' 9"  (1.753 m)   Body mass index is 33.14 kg/m. Physical Exam  Labs reviewed: Recent Labs    06/13/18 0733 06/19/18 0600 07/02/18 0548  NA 137 138 140  K 4.1 4.4 3.6  CL 95* 99 95*  CO2 32 26 31  GLUCOSE 117* 85 102*  BUN 45* 53* 85*  CREATININE 2.07* 2.30* 3.42*  CALCIUM 9.0 8.8* 9.3   Recent Labs    05/18/18 0700  AST 23  ALT 14  ALKPHOS 74  BILITOT 0.9  PROT 7.3  ALBUMIN 3.1*   Recent Labs    09/11/17 0719 12/29/17 0700 05/18/18 0700  WBC 4.7 4.5 4.8  NEUTROABS 2.5 2.5 3.0  HGB 12.6 12.4 12.6  HCT 42.1 41.0 44.4  MCV 82.5 86.0 85.4  PLT 183 221 236   Lab Results  Component Value Date   TSH 1.544 05/18/2018   Lab Results  Component Value Date   HGBA1C 8.2 (H) 05/18/2018   Lab Results  Component Value Date   CHOL 150 11/26/2016   HDL 45 11/26/2016   LDLCALC 85 11/26/2016   TRIG 99 11/26/2016   CHOLHDL 3.3 11/26/2016    Significant Diagnostic Results in last 30 days:  Dg Chest 2 View  Result Date: 07/03/2018 CLINICAL DATA:  Shortness of breath and cough EXAM: CHEST - 2 VIEW COMPARISON:  Chest radiograph June 18, 2015 and chest CT July 19, 2017  FINDINGS: There is no edema or consolidation. There is a small left pleural effusion. Heart is mildly enlarged. There is prominence of the main pulmonary vessels with rapid peripheral tapering, likely indicative of pulmonary arterial hypertension. There is aortic atherosclerosis. No adenopathy evident. There is postoperative change with clips in the left axillary region. There are foci of degenerative change in the thoracic spine. IMPRESSION: Mild cardiomegaly with evidence of pulmonary arterial hypertension. No edema or consolidation. Small left pleural effusion. Aortic Atherosclerosis (ICD10-I70.0). Electronically  Signed   By: Lowella Grip III M.D.   On: 07/03/2018 19:43    Assessment/Plan There are no diagnoses linked to this encounter.   Family/ staff Communication: ***  Labs/tests ordered:  ***

## 2018-07-05 ENCOUNTER — Non-Acute Institutional Stay (SKILLED_NURSING_FACILITY): Payer: Medicare Other | Admitting: Internal Medicine

## 2018-07-05 ENCOUNTER — Encounter: Payer: Self-pay | Admitting: Internal Medicine

## 2018-07-05 ENCOUNTER — Encounter (HOSPITAL_COMMUNITY)
Admission: RE | Admit: 2018-07-05 | Discharge: 2018-07-05 | Disposition: A | Discharge status: Home / Self Care | Payer: Medicare Other | Source: Skilled Nursing Facility | Attending: Internal Medicine | Admitting: Internal Medicine

## 2018-07-05 DIAGNOSIS — G629 Polyneuropathy, unspecified: Secondary | ICD-10-CM | POA: Diagnosis not present

## 2018-07-05 DIAGNOSIS — R21 Rash and other nonspecific skin eruption: Secondary | ICD-10-CM | POA: Diagnosis not present

## 2018-07-05 DIAGNOSIS — K219 Gastro-esophageal reflux disease without esophagitis: Secondary | ICD-10-CM | POA: Diagnosis not present

## 2018-07-05 DIAGNOSIS — R600 Localized edema: Secondary | ICD-10-CM | POA: Insufficient documentation

## 2018-07-05 DIAGNOSIS — N184 Chronic kidney disease, stage 4 (severe): Secondary | ICD-10-CM | POA: Diagnosis not present

## 2018-07-05 DIAGNOSIS — Z7982 Long term (current) use of aspirin: Secondary | ICD-10-CM | POA: Insufficient documentation

## 2018-07-05 DIAGNOSIS — L97929 Non-pressure chronic ulcer of unspecified part of left lower leg with unspecified severity: Secondary | ICD-10-CM | POA: Diagnosis not present

## 2018-07-05 DIAGNOSIS — I83029 Varicose veins of left lower extremity with ulcer of unspecified site: Secondary | ICD-10-CM | POA: Insufficient documentation

## 2018-07-05 DIAGNOSIS — I739 Peripheral vascular disease, unspecified: Secondary | ICD-10-CM | POA: Diagnosis not present

## 2018-07-05 DIAGNOSIS — I1310 Hypertensive heart and chronic kidney disease without heart failure, with stage 1 through stage 4 chronic kidney disease, or unspecified chronic kidney disease: Secondary | ICD-10-CM | POA: Insufficient documentation

## 2018-07-05 DIAGNOSIS — E1122 Type 2 diabetes mellitus with diabetic chronic kidney disease: Secondary | ICD-10-CM | POA: Diagnosis not present

## 2018-07-05 DIAGNOSIS — Z79899 Other long term (current) drug therapy: Secondary | ICD-10-CM | POA: Diagnosis not present

## 2018-07-05 LAB — CBC WITH DIFFERENTIAL/PLATELET
Abs Immature Granulocytes: 0.02 10*3/uL (ref 0.00–0.07)
Basophils Absolute: 0 10*3/uL (ref 0.0–0.1)
Basophils Relative: 0 %
Eosinophils Absolute: 0 10*3/uL (ref 0.0–0.5)
Eosinophils Relative: 1 %
HCT: 40.4 % (ref 36.0–46.0)
Hemoglobin: 11.7 g/dL — ABNORMAL LOW (ref 12.0–15.0)
Immature Granulocytes: 0 %
Lymphocytes Relative: 14 %
Lymphs Abs: 0.8 10*3/uL (ref 0.7–4.0)
MCH: 23.2 pg — ABNORMAL LOW (ref 26.0–34.0)
MCHC: 29 g/dL — ABNORMAL LOW (ref 30.0–36.0)
MCV: 80 fL (ref 80.0–100.0)
Monocytes Absolute: 1.1 10*3/uL — ABNORMAL HIGH (ref 0.1–1.0)
Monocytes Relative: 18 %
Neutro Abs: 4 10*3/uL (ref 1.7–7.7)
Neutrophils Relative %: 67 %
Platelets: 149 10*3/uL — ABNORMAL LOW (ref 150–400)
RBC: 5.05 MIL/uL (ref 3.87–5.11)
RDW: 17.1 % — ABNORMAL HIGH (ref 11.5–15.5)
WBC: 5.9 10*3/uL (ref 4.0–10.5)
nRBC: 0 % (ref 0.0–0.2)

## 2018-07-05 LAB — BASIC METABOLIC PANEL
Anion gap: 13 (ref 5–15)
BUN: 90 mg/dL — ABNORMAL HIGH (ref 8–23)
CO2: 35 mmol/L — ABNORMAL HIGH (ref 22–32)
Calcium: 9.3 mg/dL (ref 8.9–10.3)
Chloride: 94 mmol/L — ABNORMAL LOW (ref 98–111)
Creatinine, Ser: 2.83 mg/dL — ABNORMAL HIGH (ref 0.44–1.00)
GFR calc Af Amer: 16 mL/min — ABNORMAL LOW (ref >60)
GFR calc non Af Amer: 14 mL/min — ABNORMAL LOW (ref >60)
Glucose, Bld: 91 mg/dL (ref 70–99)
Potassium: 3.9 mmol/L (ref 3.5–5.1)
Sodium: 142 mmol/L (ref 135–145)

## 2018-07-05 NOTE — Progress Notes (Signed)
Location:    [facility] Room Number: 149/W Place of Service:  SNF [phone]) Provider: Veleta Miners MD  [name], MD  Patient Care Team: [name], MD as PCP - General (Internal Medicine) [name], NP as Nurse Practitioner (Geriatric Medicine)  Extended Emergency Contact Information Primary Emergency Contact: Valarie Merino Address: [place], [place] Phone: [phone] Work Phone: [phone] Relation: Daughter  Code Status:  DNR Goals of care: Advanced Directive information Advanced Directives 07/05/2018  Does Patient Have a Medical Advance Directive? Yes  Type of Advance Directive Out of facility DNR (pink MOST or yellow form)  Does patient want to make changes to medical advance directive? No - Patient declined  Copy of [place] in Chart? -  Pre-existing out of facility DNR order (yellow form or pink MOST form) -     Chief Complaint  Patient presents with  . Acute Visit    Blisters and Renal Insuffciency    HPI:  Pt is a 85 y.o. female seen today for an acute visit for Follow up of her Hypoxia, Skin Rash and Renal Insufficiency  Patient has H/O Dementia, Diabetes Type 2, LE Edema,Hypothyroidism, Peripheral vascular disease and Chronic Venous stasis, Hyperlipidemia. GERD with Esophagitis.and Depression.  A week ago patient was seen because Developed Vesicular  rash throughout her body with itching, worsening lower extremity edema with blisters which were leaking  We  stopped her Tradjenta and eventually glipizide which were the 2 new medications added recently .   Added metolazone to help her edema. Patient edema did get better. she lost almost 5 pounds Due to diuresis patient started having worsening of her renal function.  Today her creatinine is better but BUN is still high. For Her Rash she was started on low dose of Prednisone  It is Almost  resolved. During this time patient also had problems with hypoxia with pulse ox dropping to 80s here.  We did the chest x-ray which was negative for any CHF.  She did have some wheezing on exam and now she is on some DuoNebs Patient continues to be clinically stable.  Eating well no nausea or vomiting or abdominal pain or diarrhea she denies any shortness of breath or cough or chest pain. No Fever Her Daughter does not want any aggressive measures   Past Medical History:  Diagnosis Date  . Acute cystitis   . Dementia ([place])   . Dermatomycosis, unspecified   . DJD (degenerative joint disease)    Spine  . DM (diabetes mellitus) ([place])   . GERD (gastroesophageal reflux disease)   . History of pulmonary embolism   . History of supraventricular tachycardia   . Hyperlipidemia   . Hypertension   . Hypothyroidism   . Long term (current) use of anticoagulants    coumadin  . Obesity   . Other malaise and fatigue   . PVD (peripheral vascular disease) ([place])    Past Surgical History:  Procedure Laterality Date  . ATRIAL ABLATION SURGERY  01/2003   Catheter, to tachic palpitations  . ESOPHAGOGASTRODUODENOSCOPY N/A 09/26/2017   Procedure: ESOPHAGOGASTRODUODENOSCOPY (EGD);  Surgeon: Rogene Houston, MD;  [facility];  Service: Endoscopy;  Laterality: N/A;  2:50  . MASTECTOMY  2003   Left  . THYROIDECTOMY, PARTIAL  1962    Allergies  Allergen Reactions  . Contrast Media [Iodinated Diagnostic Agents] Other (  See Comments)    Unknown-listed on MAR-patient is not aware of these allergies  . Latex Other (See Comments)    Unknown-listed on MAR-patient is not aware of these allergies    Outpatient Encounter Medications as of 07/05/2018  Medication Sig  . acetaminophen (TYLENOL) 325 MG tablet Take 650 mg by mouth 2 (two) times daily.  Marland Kitchen aspirin 81 MG chewable tablet Chew 81 mg by mouth daily.  Roseanne Kaufman Peru-Castor Oil (VENELEX) OINT Apply 1 application topically See admin instructions.  Apply to sacrum and bilateral buttocks each shift and prn for prevention   . cilostazol (PLETAL) 50 MG tablet Take 50 mg by mouth 2 (two) times daily.    Marland Kitchen escitalopram (LEXAPRO) 5 MG tablet Take 5 mg by mouth at bedtime.   . gabapentin (NEURONTIN) 300 MG capsule Take 300 mg by mouth 2 (two) times daily.    Marland Kitchen ipratropium (ATROVENT) 0.02 % nebulizer solution Take 0.5 mg by nebulization every 6 (six) hours as needed for wheezing or shortness of breath.  . levothyroxine (SYNTHROID, LEVOTHROID) 50 MCG tablet Take 50 mcg by mouth daily.    . memantine (NAMENDA) 5 MG tablet Take 5 mg by mouth 2 (two) times daily.   . Multiple Vitamins-Minerals (MULTIVITAMIN WITH MINERALS) tablet Take 1 tablet by mouth daily.  . NON FORMULARY Diet Type:  Consistent CHO Diet  . OXYGEN Inhale 2 L/min into the lungs continuous. Titrate as needed to maintain saturation > 90%  . ranitidine (ZANTAC) 150 MG capsule Take 1 capsule (150 mg total) by mouth 2 (two) times daily.  . [DISCONTINUED] furosemide (LASIX) 40 MG tablet Take 40 mg by mouth daily.    No facility-administered encounter medications on file as of 07/05/2018.      Review of Systems  Constitutional: Negative.   HENT: Negative.   Respiratory: Negative.   Cardiovascular: Positive for leg swelling.  Gastrointestinal: Negative.   Genitourinary: Negative.   Musculoskeletal: Negative.   Skin: Positive for rash and wound.  Neurological: Negative.   Psychiatric/Behavioral: Negative.     Immunization History  Administered Date(s) Administered  . Influenza Whole 10/11/2006  . Influenza-Unspecified 03/19/2009, 12/30/2016  . Pneumococcal Conjugate-13 12/30/2016, 06/22/2018  . Td 01/14/2003  . Tdap 12/15/2016   Pertinent  Health Maintenance Due  Topic Date Due  . FOOT EXAM  09/26/2018  . INFLUENZA VACCINE  10/27/2018  . HEMOGLOBIN A1C  11/16/2018  . URINE MICROALBUMIN  12/30/2018  . OPHTHALMOLOGY EXAM  01/31/2019  . PNA vac Low Risk Adult (2 of 2 -  PPSV23) 06/22/2019  . DEXA SCAN  Completed   Fall Risk  12/28/2017 12/15/2016  Falls in the past year? No No   Functional Status Survey:    There were no vitals filed for this visit. There is no height or weight on file to calculate BMI. Physical Exam Vitals signs reviewed.  Constitutional:      Appearance: Normal appearance.  HENT:     Head: Normocephalic.     Nose: Nose normal.     Mouth/Throat:     Mouth: Mucous membranes are moist.     Pharynx: Oropharynx is clear.  Eyes:     Pupils: Pupils are equal, round, and reactive to light.  Neck:     Musculoskeletal: Neck supple.  Cardiovascular:     Rate and Rhythm: Normal rate and regular rhythm.     Pulses: Normal pulses.     Heart sounds: Normal heart sounds.  Pulmonary:     Effort: Pulmonary  effort is normal. No respiratory distress.     Breath sounds: Wheezing present.     Comments: Have Expiratory Wheezing Abdominal:     General: Abdomen is flat. Bowel sounds are normal. There is no distension.     Palpations: Abdomen is soft.     Tenderness: There is no abdominal tenderness. There is no guarding.  Musculoskeletal:     Comments: Mild Edema Bilateral with Clear wound on her Left LE. Base is clear Has Blisters in Right LE also  Skin:    Comments: Has small vesicles all over her body especially in her back and Extremities but they have healing now  Neurological:     General: No focal deficit present.     Mental Status: She is alert.  Psychiatric:        Mood and Affect: Mood normal.        Thought Content: Thought content normal.        Judgment: Judgment normal.     Labs reviewed: Recent Labs    06/19/18 0600 07/02/18 0548 07/05/18 0420  NA 138 140 142  K 4.4 3.6 3.9  CL 99 95* 94*  CO2 26 31 35*  GLUCOSE 85 102* 91  BUN 53* 85* 90*  CREATININE 2.30* 3.42* 2.83*  CALCIUM 8.8* 9.3 9.3   Recent Labs    05/18/18 0700  AST 23  ALT 14  ALKPHOS 74  BILITOT 0.9  PROT 7.3  ALBUMIN 3.1*   Recent Labs     12/29/17 0700 05/18/18 0700 07/05/18 0420  WBC 4.5 4.8 5.9  NEUTROABS 2.5 3.0 4.0  HGB 12.4 12.6 11.7*  HCT 41.0 44.4 40.4  MCV 86.0 85.4 80.0  PLT 221 236 149*   Lab Results  Component Value Date   TSH 1.544 05/18/2018   Lab Results  Component Value Date   HGBA1C 8.2 (H) 05/18/2018   Lab Results  Component Value Date   CHOL 150 11/26/2016   HDL 45 11/26/2016   LDLCALC 85 11/26/2016   TRIG 99 11/26/2016   CHOLHDL 3.3 11/26/2016    Significant Diagnostic Results in last 30 days:  Dg Chest 2 View  Result Date: 07/03/2018 CLINICAL DATA:  Shortness of breath and cough EXAM: CHEST - 2 VIEW COMPARISON:  Chest radiograph June 18, 2015 and chest CT July 19, 2017 FINDINGS: There is no edema or consolidation. There is a small left pleural effusion. Heart is mildly enlarged. There is prominence of the main pulmonary vessels with rapid peripheral tapering, likely indicative of pulmonary arterial hypertension. There is aortic atherosclerosis. No adenopathy evident. There is postoperative change with clips in the left axillary region. There are foci of degenerative change in the thoracic spine. IMPRESSION: Mild cardiomegaly with evidence of pulmonary arterial hypertension. No edema or consolidation. Small left pleural effusion. Aortic Atherosclerosis (ICD10-I70.0). Electronically Signed   By: Lowella Grip III M.D.   On: 07/03/2018 19:43    Assessment/Plan  LE edema It is much better Continue Xeroform and Light Wrapping of her LE Edema is much better and Not leaking Acute renal Insufficiency Will discontinue her Lasix also Metolazone also discontinued after only 3 doses Will repeat BMP in few days  Vesicula Rash Better on Low dose of Prednisone Also Tradjenta and Glipizide Discontinued  Hypoxia Chest Xray was negative She is 90 % Today on RA She refuses to keep her oxygen anyway Continue Duonebs Dementia I have also discontinued Aricept and Namenda right now Will  continue with supportive care Family/ staff  Communication:   Labs/tests ordered:  BMP Total time spent in this patient care encounter was _25 minutes; greater than 50% of the visit spent counseling patient, reviewing records , Labs and coordinating care for problems addressed at this encounter.

## 2018-07-09 ENCOUNTER — Encounter (HOSPITAL_COMMUNITY)
Admission: RE | Admit: 2018-07-09 | Discharge: 2018-07-09 | Disposition: A | Discharge status: Home / Self Care | Payer: Medicare Other | Source: Skilled Nursing Facility | Attending: *Deleted | Admitting: *Deleted

## 2018-07-09 DIAGNOSIS — F329 Major depressive disorder, single episode, unspecified: Secondary | ICD-10-CM | POA: Diagnosis present

## 2018-07-09 DIAGNOSIS — G629 Polyneuropathy, unspecified: Secondary | ICD-10-CM | POA: Diagnosis not present

## 2018-07-09 DIAGNOSIS — I10 Essential (primary) hypertension: Secondary | ICD-10-CM | POA: Diagnosis present

## 2018-07-09 LAB — BASIC METABOLIC PANEL
Anion gap: 10 (ref 5–15)
BUN: 72 mg/dL — ABNORMAL HIGH (ref 8–23)
CO2: 35 mmol/L — ABNORMAL HIGH (ref 22–32)
Calcium: 9 mg/dL (ref 8.9–10.3)
Chloride: 98 mmol/L (ref 98–111)
Creatinine, Ser: 2.1 mg/dL — ABNORMAL HIGH (ref 0.44–1.00)
GFR calc Af Amer: 23 mL/min — ABNORMAL LOW (ref >60)
GFR calc non Af Amer: 20 mL/min — ABNORMAL LOW (ref >60)
Glucose, Bld: 100 mg/dL — ABNORMAL HIGH (ref 70–99)
Potassium: 3.7 mmol/L (ref 3.5–5.1)
Sodium: 143 mmol/L (ref 135–145)

## 2018-07-12 ENCOUNTER — Non-Acute Institutional Stay (SKILLED_NURSING_FACILITY): Payer: Medicare Other | Admitting: Internal Medicine

## 2018-07-12 ENCOUNTER — Encounter: Payer: Self-pay | Admitting: Internal Medicine

## 2018-07-12 DIAGNOSIS — N184 Chronic kidney disease, stage 4 (severe): Secondary | ICD-10-CM

## 2018-07-12 DIAGNOSIS — G301 Alzheimer's disease with late onset: Secondary | ICD-10-CM | POA: Diagnosis not present

## 2018-07-12 DIAGNOSIS — E1122 Type 2 diabetes mellitus with diabetic chronic kidney disease: Secondary | ICD-10-CM

## 2018-07-12 DIAGNOSIS — L97929 Non-pressure chronic ulcer of unspecified part of left lower leg with unspecified severity: Secondary | ICD-10-CM

## 2018-07-12 DIAGNOSIS — F0280 Dementia in other diseases classified elsewhere without behavioral disturbance: Secondary | ICD-10-CM

## 2018-07-12 DIAGNOSIS — I10 Essential (primary) hypertension: Secondary | ICD-10-CM | POA: Diagnosis not present

## 2018-07-12 DIAGNOSIS — E1151 Type 2 diabetes mellitus with diabetic peripheral angiopathy without gangrene: Secondary | ICD-10-CM | POA: Diagnosis not present

## 2018-07-12 DIAGNOSIS — I83029 Varicose veins of left lower extremity with ulcer of unspecified site: Secondary | ICD-10-CM

## 2018-07-12 NOTE — Progress Notes (Signed)
Location:  [facility] Room Number: [place] of Service:  SNF [phone]) Provider:  Veleta Miners, MD  [name], MD  Patient Care Team: [name], MD as PCP - General (Internal Medicine) [name], NP as Nurse Practitioner (Geriatric Medicine)  Extended Emergency Contact Information Primary Emergency Contact: Valarie Merino Address: [place], [place] Phone: [phone] Work Phone: [phone] Relation: Daughter  Code Status:  DNR Goals of care: Advanced Directive information Advanced Directives 07/12/2018  Does Patient Have a Medical Advance Directive? Yes  Type of Advance Directive Out of facility DNR (pink MOST or yellow form)  Does patient want to make changes to medical advance directive? No - Patient declined  Copy of [place] in Chart? -  Pre-existing out of facility DNR order (yellow form or pink MOST form) Yellow form placed in chart (order not valid for inpatient use)     Chief Complaint  Patient presents with  . Medical Management of Chronic Issues    Hypertension, Diabetes Mellitus Type 2    HPI:  Pt is a 85 y.o. female seen today for medical management of chronic diseases.    Patient has H/O Dementia, Diabetes Type 2, LE Edema,Hypothyroidism, Peripheral vascular disease and Chronic Venous stasis, Hyperlipidemia. GERD with Esophagitis.and Depression. Patient is long term resident of facility  She has had recent decline with Worsening LE edema with blisters. She also had Rash all over her body. Her 2 new meds  Tradjenta and Glipizide were stopped. She also was treated with Lasix and metalazone Her edema is now better but due to worsening renal function we had to stop her Diuretics. Her blisters are dryer. And Nurses are trying to keep her Legs elevated to keep Edema down. But atleast it has stopped drainage. Her Renal function has improved .  Her Daughter does not want any aggressive measures Her weight is actually low. Her appetite is also poor.   Past Medical History:  Diagnosis Date  . Acute cystitis   . Dementia ([place])   . Dermatomycosis, unspecified   . DJD (degenerative joint disease)    Spine  . DM (diabetes mellitus) ([place])   . GERD (gastroesophageal reflux disease)   . History of pulmonary embolism   . History of supraventricular tachycardia   . Hyperlipidemia   . Hypertension   . Hypothyroidism   . Long term (current) use of anticoagulants    coumadin  . Obesity   . Other malaise and fatigue   . PVD (peripheral vascular disease) ([place])    Past Surgical History:  Procedure Laterality Date  . ATRIAL ABLATION SURGERY  01/2003   Catheter, to tachic palpitations  . ESOPHAGOGASTRODUODENOSCOPY N/A 10/16/2017   Procedure: ESOPHAGOGASTRODUODENOSCOPY (EGD);  Surgeon: Rogene Houston, MD;  [facility];  Service: Endoscopy;  Laterality: N/A;  2:50  . MASTECTOMY  2003   Left  . THYROIDECTOMY, PARTIAL  1962    Allergies  Allergen Reactions  . Contrast Media [Iodinated Diagnostic Agents] Other (See Comments)    Unknown-listed on MAR-patient is not aware of these allergies  . Latex Other (See Comments)    Unknown-listed on MAR-patient is not aware of these allergies    Outpatient Encounter Medications as of 07/12/2018  Medication Sig  . acetaminophen (TYLENOL) 325 MG tablet Take 650 mg by mouth 2 (two) times daily.  Marland Kitchen aspirin 81 MG chewable  tablet Chew 81 mg by mouth daily.  Roseanne Kaufman [place]-Castor Oil (VENELEX) OINT Apply 1 application topically See admin instructions. Apply to sacrum and bilateral buttocks each shift and prn for prevention   . cilostazol (PLETAL) 50 MG tablet Take 50 mg by mouth 2 (two) times daily.    Marland Kitchen escitalopram (LEXAPRO) 5 MG tablet Take 5 mg by mouth at bedtime.   . gabapentin (NEURONTIN) 300 MG capsule Take 300 mg by mouth 2 (two) times daily.    Marland Kitchen ipratropium (ATROVENT) 0.02 %  nebulizer solution Take 0.5 mg by nebulization every 6 (six) hours as needed for wheezing or shortness of breath.  . levothyroxine (SYNTHROID, LEVOTHROID) 50 MCG tablet Take 50 mcg by mouth daily.    . memantine (NAMENDA) 5 MG tablet Take 5 mg by mouth 2 (two) times daily.   . Multiple Vitamins-Minerals (MULTIVITAMIN WITH MINERALS) tablet Take 1 tablet by mouth daily.  . NON FORMULARY Diet Type:  Consistent CHO Diet  . OXYGEN Inhale 2 L/min into the lungs continuous. Titrate as needed to maintain saturation > 90%  . ranitidine (ZANTAC) 150 MG capsule Take 1 capsule (150 mg total) by mouth 2 (two) times daily.   No facility-administered encounter medications on file as of 07/12/2018.     Review of Systems  Unable to perform ROS: Dementia    Immunization History  Administered Date(s) Administered  . Influenza Whole 10/11/2006  . Influenza-Unspecified 03/19/2009, 12/30/2016  . Pneumococcal Conjugate-13 12/30/2016, 06/22/2018  . Td 01/14/2003  . Tdap 12/15/2016   Pertinent  Health Maintenance Due  Topic Date Due  . FOOT EXAM  09/26/2018  . INFLUENZA VACCINE  10/27/2018  . HEMOGLOBIN A1C  11/16/2018  . URINE MICROALBUMIN  12/30/2018  . OPHTHALMOLOGY EXAM  01/31/2019  . PNA vac Low Risk Adult (2 of 2 - PPSV23) 06/22/2019  . DEXA SCAN  Completed   Fall Risk  12/28/2017 12/15/2016  Falls in the past year? No No   Functional Status Survey:    Vitals:   07/12/18 1009  BP: 116/71  Pulse: 84  Resp: 18  Temp: (!) 97.4 F (36.3 C)  Weight: 224 lb 6.4 oz (101.8 kg)  Height: 5\' 9"  (1.753 m)   Body mass index is 33.14 kg/m. Physical Exam Vitals signs reviewed.  Constitutional:      Appearance: Normal appearance.  HENT:     Head: Normocephalic.     Nose: Nose normal.     Mouth/Throat:     Mouth: Mucous membranes are moist.     Pharynx: Oropharynx is clear.  Eyes:     Pupils: Pupils are equal, round, and reactive to light.  Neck:     Musculoskeletal: Neck supple.   Cardiovascular:     Rate and Rhythm: Normal rate and regular rhythm.     Pulses: Normal pulses.     Heart sounds: Normal heart sounds.  Pulmonary:     Effort: Pulmonary effort is normal. No respiratory distress.     Breath sounds: No wheezing.  Abdominal:     General: Abdomen is flat. Bowel sounds are normal. There is no distension.     Palpations: Abdomen is soft.     Tenderness: There is no abdominal tenderness. There is no guarding.  Musculoskeletal:     Comments: Mild Edema Bilateral with Clear wound on her Left LE. Base is clear Has open  Blisters in Right LE also. But they are dry and Clean  Skin:    Comments: Has small vesicles  all over her body especially in her back and Extremities but they have healing now and are dry  Neurological:     General: No focal deficit present.     Mental Status: She is alert.  Psychiatric:        Mood and Affect: Mood normal.        Thought Content: Thought content normal.        Judgment: Judgment normal.     Labs reviewed: Recent Labs    07/02/18 0548 07/05/18 0420 07/09/18 0700  NA 140 142 143  K 3.6 3.9 3.7  CL 95* 94* 98  CO2 31 35* 35*  GLUCOSE 102* 91 100*  BUN 85* 90* 72*  CREATININE 3.42* 2.83* 2.10*  CALCIUM 9.3 9.3 9.0   Recent Labs    05/18/18 0700  AST 23  ALT 14  ALKPHOS 74  BILITOT 0.9  PROT 7.3  ALBUMIN 3.1*   Recent Labs    12/29/17 0700 05/18/18 0700 07/05/18 0420  WBC 4.5 4.8 5.9  NEUTROABS 2.5 3.0 4.0  HGB 12.4 12.6 11.7*  HCT 41.0 44.4 40.4  MCV 86.0 85.4 80.0  PLT 221 236 149*   Lab Results  Component Value Date   TSH 1.544 05/18/2018   Lab Results  Component Value Date   HGBA1C 8.2 (H) 05/18/2018   Lab Results  Component Value Date   CHOL 150 11/26/2016   HDL 45 11/26/2016   LDLCALC 85 11/26/2016   TRIG 99 11/26/2016   CHOLHDL 3.3 11/26/2016    Significant Diagnostic Results in last 30 days:  Dg Chest 2 View  Result Date: 07/03/2018 CLINICAL DATA:  Shortness of breath and  cough EXAM: CHEST - 2 VIEW COMPARISON:  Chest radiograph June 18, 2015 and chest CT July 19, 2017 FINDINGS: There is no edema or consolidation. There is a small left pleural effusion. Heart is mildly enlarged. There is prominence of the main pulmonary vessels with rapid peripheral tapering, likely indicative of pulmonary arterial hypertension. There is aortic atherosclerosis. No adenopathy evident. There is postoperative change with clips in the left axillary region. There are foci of degenerative change in the thoracic spine. IMPRESSION: Mild cardiomegaly with evidence of pulmonary arterial hypertension. No edema or consolidation. Small left pleural effusion. Aortic Atherosclerosis (ICD10-I70.0). Electronically Signed   By: Lowella Grip III M.D.   On: 07/03/2018 19:43    Assessment/Plan LE edema It is much better Continue Xeroform and Light Wrapping of her LE Edema is much better and her blisters are dry Acute renal Insufficiency Metolazone also discontinued after only 3 doses Lasix also discontinued for now Follow weight closely  Repeat BMP pending Diabetes Mellitus We had to discontinue both Tradjenta and Glipizide for now Hba1C is mildily high but her sugars are less then 200 Will try on glipizide when her rash is better and her appetite picks up Vesicula Rash Better with Low dose of Prednisone She is off prednisone now Also Tradjenta and Glipizide Discontinued  Hypoxia resolved now ? COPD Chest Xray was negative She is 90 % Today on RA She refuses to keep her oxygen anyway Continue Duonebs PRN Dementia I have also discontinued Aricept and Namenda right now Will continue with supportive care Her daughter does not want any aggressive measures  Other Issues Peripheral vascular disease Onaspirin Also on [place] Statin Hypothyroidism TSH normal in02/20  Depression Will Continue Lexapro Remeron discontinued due to weight gain GERDWith Esophagitis Per EGD On  Ranitidine BID  Family/ staff Communication:  Labs/tests  ordered: BMP Total time spent in this patient care encounter was  25_  minutes; greater than 50% of the visit spent counseling patient and staff, reviewing records , Labs and coordinating care for problems addressed at this encounter.

## 2018-07-16 ENCOUNTER — Encounter (HOSPITAL_COMMUNITY)
Admission: RE | Admit: 2018-07-16 | Discharge: 2018-07-16 | Disposition: A | Discharge status: Home / Self Care | Payer: Medicare Other | Source: Skilled Nursing Facility | Attending: Internal Medicine | Admitting: Internal Medicine

## 2018-07-16 DIAGNOSIS — G629 Polyneuropathy, unspecified: Secondary | ICD-10-CM | POA: Insufficient documentation

## 2018-07-16 DIAGNOSIS — I1310 Hypertensive heart and chronic kidney disease without heart failure, with stage 1 through stage 4 chronic kidney disease, or unspecified chronic kidney disease: Secondary | ICD-10-CM | POA: Insufficient documentation

## 2018-07-16 DIAGNOSIS — R600 Localized edema: Secondary | ICD-10-CM | POA: Diagnosis not present

## 2018-07-16 LAB — BASIC METABOLIC PANEL
Anion gap: 12 (ref 5–15)
BUN: 48 mg/dL — ABNORMAL HIGH (ref 8–23)
CO2: 33 mmol/L — ABNORMAL HIGH (ref 22–32)
Calcium: 8.8 mg/dL — ABNORMAL LOW (ref 8.9–10.3)
Chloride: 100 mmol/L (ref 98–111)
Creatinine, Ser: 1.84 mg/dL — ABNORMAL HIGH (ref 0.44–1.00)
GFR calc Af Amer: 27 mL/min — ABNORMAL LOW (ref >60)
GFR calc non Af Amer: 23 mL/min — ABNORMAL LOW (ref >60)
Glucose, Bld: 89 mg/dL (ref 70–99)
Potassium: 3.8 mmol/L (ref 3.5–5.1)
Sodium: 145 mmol/L (ref 135–145)

## 2018-07-26 NOTE — Progress Notes (Signed)
This encounter was created in error - please disregard.

## 2018-07-26 NOTE — Progress Notes (Deleted)
This encounter was created in error - please disregard.

## 2018-07-31 ENCOUNTER — Encounter: Payer: Self-pay | Admitting: Adult Health

## 2018-07-31 ENCOUNTER — Other Ambulatory Visit (HOSPITAL_COMMUNITY)
Admission: RE | Admit: 2018-07-31 | Discharge: 2018-07-31 | Disposition: A | Discharge status: Home / Self Care | Payer: Medicare Other | Source: Intra-hospital | Attending: Adult Health | Admitting: Adult Health

## 2018-07-31 ENCOUNTER — Non-Acute Institutional Stay (SKILLED_NURSING_FACILITY): Payer: Medicare Other | Admitting: Adult Health

## 2018-07-31 DIAGNOSIS — I1310 Hypertensive heart and chronic kidney disease without heart failure, with stage 1 through stage 4 chronic kidney disease, or unspecified chronic kidney disease: Secondary | ICD-10-CM | POA: Diagnosis present

## 2018-07-31 DIAGNOSIS — R634 Abnormal weight loss: Secondary | ICD-10-CM | POA: Diagnosis not present

## 2018-07-31 DIAGNOSIS — E1122 Type 2 diabetes mellitus with diabetic chronic kidney disease: Secondary | ICD-10-CM

## 2018-07-31 DIAGNOSIS — N184 Chronic kidney disease, stage 4 (severe): Secondary | ICD-10-CM

## 2018-07-31 DIAGNOSIS — F339 Major depressive disorder, recurrent, unspecified: Secondary | ICD-10-CM | POA: Insufficient documentation

## 2018-07-31 DIAGNOSIS — F0280 Dementia in other diseases classified elsewhere without behavioral disturbance: Secondary | ICD-10-CM

## 2018-07-31 DIAGNOSIS — G301 Alzheimer's disease with late onset: Secondary | ICD-10-CM | POA: Diagnosis not present

## 2018-07-31 LAB — CBC WITH DIFFERENTIAL/PLATELET
Abs Immature Granulocytes: 0.01 10*3/uL (ref 0.00–0.07)
Basophils Absolute: 0 10*3/uL (ref 0.0–0.1)
Basophils Relative: 1 %
Eosinophils Absolute: 0.1 10*3/uL (ref 0.0–0.5)
Eosinophils Relative: 2 %
HCT: 44 % (ref 36.0–46.0)
Hemoglobin: 12.3 g/dL (ref 12.0–15.0)
Immature Granulocytes: 0 %
Lymphocytes Relative: 24 %
Lymphs Abs: 0.9 10*3/uL (ref 0.7–4.0)
MCH: 22.7 pg — ABNORMAL LOW (ref 26.0–34.0)
MCHC: 28 g/dL — ABNORMAL LOW (ref 30.0–36.0)
MCV: 81 fL (ref 80.0–100.0)
Monocytes Absolute: 0.6 10*3/uL (ref 0.1–1.0)
Monocytes Relative: 17 %
Neutro Abs: 2 10*3/uL (ref 1.7–7.7)
Neutrophils Relative %: 56 %
Platelets: 134 10*3/uL — ABNORMAL LOW (ref 150–400)
RBC: 5.43 MIL/uL — ABNORMAL HIGH (ref 3.87–5.11)
RDW: 20.5 % — ABNORMAL HIGH (ref 11.5–15.5)
WBC: 3.6 10*3/uL — ABNORMAL LOW (ref 4.0–10.5)
nRBC: 0 % (ref 0.0–0.2)

## 2018-07-31 LAB — COMPREHENSIVE METABOLIC PANEL
ALT: 12 U/L (ref 0–44)
AST: 21 U/L (ref 15–41)
Albumin: 3.2 g/dL — ABNORMAL LOW (ref 3.5–5.0)
Alkaline Phosphatase: 41 U/L (ref 38–126)
Anion gap: 10 (ref 5–15)
BUN: 26 mg/dL — ABNORMAL HIGH (ref 8–23)
CO2: 36 mmol/L — ABNORMAL HIGH (ref 22–32)
Calcium: 9 mg/dL (ref 8.9–10.3)
Chloride: 99 mmol/L (ref 98–111)
Creatinine, Ser: 1.54 mg/dL — ABNORMAL HIGH (ref 0.44–1.00)
GFR calc Af Amer: 33 mL/min — ABNORMAL LOW (ref >60)
GFR calc non Af Amer: 29 mL/min — ABNORMAL LOW (ref >60)
Glucose, Bld: 105 mg/dL — ABNORMAL HIGH (ref 70–99)
Potassium: 3.9 mmol/L (ref 3.5–5.1)
Sodium: 145 mmol/L (ref 135–145)
Total Bilirubin: 1.5 mg/dL — ABNORMAL HIGH (ref 0.3–1.2)
Total Protein: 6.7 g/dL (ref 6.5–8.1)

## 2018-07-31 NOTE — Progress Notes (Signed)
Location:   [place] Room Number: [place] of Service:  SNF (31)   CODE STATUS: DNR  Allergies  Allergen Reactions  . Contrast Media [Iodinated Diagnostic Agents] Other (See Comments)    Unknown-listed on MAR-patient is not aware of these allergies  . Latex Other (See Comments)    Unknown-listed on MAR-patient is not aware of these allergies    Chief Complaint  Patient presents with  . Acute Visit    Change in Status    HPI:  Staff report that she has not been eating for the past several days. She is drinking fluids. She has lost weight from 224 pounds on 07-02-18 to her current weight of 211 pounds. She denies any pain; no changes in mood. She tells me that she is not hungry. Her blood work looks good. She is on very low dose of lexapro.  I have spoken with her daughter regarding her overall status. She is declining. Her daughter is aware of her weight loss. At this time will change her to zoloft and will add remeron for 30 days. Her daughter is in agreement with this plan.    Past Medical History:  Diagnosis Date  . Acute cystitis   . Dementia ([place])   . Dermatomycosis, unspecified   . DJD (degenerative joint disease)    Spine  . DM (diabetes mellitus) ([place])   . GERD (gastroesophageal reflux disease)   . History of pulmonary embolism   . History of supraventricular tachycardia   . Hyperlipidemia   . Hypertension   . Hypothyroidism   . Long term (current) use of anticoagulants    coumadin  . Obesity   . Other malaise and fatigue   . PVD (peripheral vascular disease) ([place])     Past Surgical History:  Procedure Laterality Date  . ATRIAL ABLATION SURGERY  01/2003   Catheter, to tachic palpitations  . ESOPHAGOGASTRODUODENOSCOPY N/A 10/12/2017   Procedure: ESOPHAGOGASTRODUODENOSCOPY (EGD);  Surgeon: Rogene Houston, MD;  Location: [facility];  Service: Endoscopy;  Laterality: N/A;  2:50  . MASTECTOMY  2003   Left  . THYROIDECTOMY,  PARTIAL  1962    Social History   Socioeconomic History  . Marital status: Widowed    Spouse name: Not on file  . Number of children: Not on file  . Years of education: Not on file  . Highest education level: Not on file  Occupational History  . Occupation: Retired    Fish farm manager: RETIRED  Social Needs  . Financial resource strain: Not hard at all  . Food insecurity:    Worry: Never true    Inability: Never true  . Transportation needs:    Medical: No    Non-medical: No  Tobacco Use  . Smoking status: Former Research scientist (life sciences)  . Smokeless tobacco: Never Used  Substance and Sexual Activity  . Alcohol use: No  . Drug use: No  . Sexual activity: Not on file  Lifestyle  . Physical activity:    Days per week: 0 days    Minutes per session: 0 min  . Stress: Not at all  Relationships  . Social connections:    Talks on phone: Never    Gets together: More than three times a week    Attends religious service: Never    Active member of club or organization: No    Attends meetings of clubs or organizations: Never    Relationship status: Widowed  . Intimate partner violence:  Fear of current or ex partner: No    Emotionally abused: No    Physically abused: No    Forced sexual activity: No  Other Topics Concern  . Not on file  Social History Narrative   Widowed   One daughter   Family History  Problem Relation Age of Onset  . Coronary artery disease Sister   . Coronary artery disease Sister   . Coronary artery disease Sister   . Heart attack Sister   . Alcohol abuse Sister       VITAL SIGNS BP 106/70   Pulse 85   Temp 97.7 F (36.5 C)   Resp 20   Ht 5\' 9"  (1.753 m)   Wt 211 lb 12.8 oz (96.1 kg)   SpO2 98%   BMI 31.28 kg/m   Outpatient Encounter Medications as of 07/31/2018  Medication Sig  . acetaminophen (TYLENOL) 325 MG tablet Take 650 mg by mouth 2 (two) times daily.  Marland Kitchen aspirin 81 MG chewable tablet Chew 81 mg by mouth daily.  Roseanne Kaufman Peru-Castor Oil (VENELEX)  OINT Apply 1 application topically See admin instructions. Apply to sacrum and bilateral buttocks each shift and prn for prevention   . cilostazol (PLETAL) 50 MG tablet Take 50 mg by mouth 2 (two) times daily.    Marland Kitchen escitalopram (LEXAPRO) 5 MG tablet Take 5 mg by mouth at bedtime.   . gabapentin (NEURONTIN) 300 MG capsule Take 300 mg by mouth 2 (two) times daily.    Marland Kitchen ipratropium (ATROVENT) 0.02 % nebulizer solution Take 0.5 mg by nebulization every 6 (six) hours as needed for wheezing or shortness of breath.  . levothyroxine (SYNTHROID, LEVOTHROID) 50 MCG tablet Take 50 mcg by mouth daily.    . memantine (NAMENDA) 5 MG tablet Take 5 mg by mouth 2 (two) times daily.   . Multiple Vitamins-Minerals (MULTIVITAMIN WITH MINERALS) tablet Take 1 tablet by mouth daily.  . NON FORMULARY Diet Type:  Consistent CHO Diet  . NON FORMULARY Closed Blisters - apply thin transparentt film (Tegaderm / Opsite) check every day, change every 7 days and as needed  . OXYGEN Inhale 2 L/min into the lungs continuous. Titrate as needed to maintain saturation > 90%  . ranitidine (ZANTAC) 150 MG capsule Take 1 capsule (150 mg total) by mouth 2 (two) times daily.   No facility-administered encounter medications on file as of 07/31/2018.      SIGNIFICANT DIAGNOSTIC EXAMS  LABS REVIEWED PREVIOUS:   12-29-17: urine micro-albumin: 19.7 05-18-18: wbc 4.8; hgb 12.6; hct 44.4; mcv 85.4; plt 236; glucose 148; bun 34; creat 1.65; k+ 4.4; na++ 141; ca 8.8; liver normal albumin 3.1 TSH 1.544; hgb a1c 8.2 05-21-18: glucose 168; bun 38; creat 1.74; k+ 4.2; na++ 139; ca 9.1  05-22-18: glucose 148; bun 36; creat 1.69 k+ 4.3; na++ 140 ca 8.6  05-30-18: glucose 140; bun 376 creat 1.68; k+ 4.5; na++ 139; ca 8.5  06-09-18: glucose 123 bun 43; creat 2.16;  k+ 4.2; na++ 139 ca 8.8  TODAY:   06-13-18: glucose 117; bun 45; creat 2.07 k+ 4.1; na++ 137; ca 9.0 06-19-18: glucose 85; bun 53; creat 2.30; k+ 4.4; na++ 138; ca 8.8 07-02-18: glucose 102;  bun 85; creat 3.42; k+ 3.6; na++ 140; ca 9.3 07-05-18: wbc 4.9; hgb 11.7; hct 40.4; mcv 80.0; plt 149 glucose 91; bun 90; creat 2.83; k+ 3.9; na++ 9.3  07-09-18: glucose 100; bun 72; creat 2.10; k+ 3.7; na ++ 143; ca 9.0 07-16-18: glucose 89;  bun 48; creat 1.84 k+ 3.8; na++ 145; ca 8.8 07-31-18: wbc 3.6; hgb 12.3; hct 44.0; mcv 81.0; plt 134; glucose 105; bun 26; creat 1.54; k+ 3.9; na++ 145; ca 9.0 total bili 1.5; albumin 3.2      Review of Systems  Unable to perform ROS: Dementia (unable to participate )    Physical Exam Constitutional:      General: She is not in acute distress.    Appearance: She is well-developed. She is obese. She is not diaphoretic.  Neck:     Thyroid: No thyromegaly.  Cardiovascular:     Rate and Rhythm: Normal rate and regular rhythm.     Heart sounds: Normal heart sounds.     Comments: History of atrial ablation  Pulmonary:     Effort: Pulmonary effort is normal. No respiratory distress.     Breath sounds: Normal breath sounds.  Chest:     Comments: Left mastectomy  Abdominal:     General: Bowel sounds are normal. There is no distension.     Palpations: Abdomen is soft.     Tenderness: There is no abdominal tenderness.  Musculoskeletal:     Right lower leg: No edema.     Left lower leg: No edema.     Comments: Is able to move all extremities   Lymphadenopathy:     Cervical: No cervical adenopathy.  Skin:    General: Skin is warm and dry.     Comments: Bilateral lower extremities discolored   Neurological:     Mental Status: She is alert. Mental status is at baseline.  Psychiatric:        Mood and Affect: Mood normal.     ASSESSMENT/ PLAN:  TODAY;   1. Late onset alzheimer's disease without behavioral disturbance 2. CKD stage 4 due to type 2 diabetes mellitus 3. Weight loss 4. Major depression recurrent chronic  Will stop lexapro Will begin zoloft 25 mg nightly through 08-14-18 then 50 mg nightly  Will start remeron 7.5 mg nightly for 30  days Will monitor Her daughter is aware and is in agreement with plan of care.      MD is aware of resident's narcotic use and is in agreement with current plan of care. We will attempt to wean resident as apropriate   Ok Edwards NP [facility]  Contact [phone] Monday through Friday 8am- 5pm  After hours call [phone]

## 2018-08-01 ENCOUNTER — Non-Acute Institutional Stay (SKILLED_NURSING_FACILITY): Payer: Medicare Other | Admitting: Adult Health

## 2018-08-01 ENCOUNTER — Encounter: Payer: Self-pay | Admitting: Adult Health

## 2018-08-01 DIAGNOSIS — E1169 Type 2 diabetes mellitus with other specified complication: Secondary | ICD-10-CM | POA: Diagnosis not present

## 2018-08-01 DIAGNOSIS — E038 Other specified hypothyroidism: Secondary | ICD-10-CM

## 2018-08-01 DIAGNOSIS — E785 Hyperlipidemia, unspecified: Secondary | ICD-10-CM

## 2018-08-01 DIAGNOSIS — N184 Chronic kidney disease, stage 4 (severe): Secondary | ICD-10-CM | POA: Diagnosis not present

## 2018-08-01 DIAGNOSIS — E1122 Type 2 diabetes mellitus with diabetic chronic kidney disease: Secondary | ICD-10-CM | POA: Diagnosis not present

## 2018-08-01 NOTE — Progress Notes (Signed)
Location:    [place] Room Number: 149/W Place of Service:  SNF (31)   CODE STATUS: DNR  Allergies  Allergen Reactions  . Contrast Media [Iodinated Diagnostic Agents] Other (See Comments)    Unknown-listed on MAR-patient is not aware of these allergies  . Latex Other (See Comments)    Unknown-listed on MAR-patient is not aware of these allergies    Chief Complaint  Patient presents with  . Medical Management of Chronic Issues    CKD stage 4 due to type 2 diabetes mellitus; dyslipidemia associated with type 2 diabetes mellitus; other specified hypothyroidism    HPI:  She is a 85 year old long term resident of this facility being seen for the management of her chronic illnesses: ckd stage 4; dyslipidemia; hypothyroidism. She did not eat breakfast this morning; staff report that she may do better with ensure clear. There are no reports of uncontrolled pain; no reports os anxiety or depressive thoughts. She is spending nearly all of her time in bed.    Past Medical History:  Diagnosis Date  . Acute cystitis   . Dementia ([place])   . Dermatomycosis, unspecified   . DJD (degenerative joint disease)    Spine  . DM (diabetes mellitus) ([place])   . GERD (gastroesophageal reflux disease)   . History of pulmonary embolism   . History of supraventricular tachycardia   . Hyperlipidemia   . Hypertension   . Hypothyroidism   . Long term (current) use of anticoagulants    coumadin  . Obesity   . Other malaise and fatigue   . PVD (peripheral vascular disease) ([place])     Past Surgical History:  Procedure Laterality Date  . ATRIAL ABLATION SURGERY  01/2003   Catheter, to tachic palpitations  . ESOPHAGOGASTRODUODENOSCOPY N/A 10/23/2017   Procedure: ESOPHAGOGASTRODUODENOSCOPY (EGD);  Surgeon: Rogene Houston, MD;  Location[facility];  Service: Endoscopy;  Laterality: N/A;  2:50  . MASTECTOMY  2003   Left  . THYROIDECTOMY, PARTIAL  1962    Social History    Socioeconomic History  . Marital status: Widowed    Spouse name: Not on file  . Number of children: Not on file  . Years of education: Not on file  . Highest education level: Not on file  Occupational History  . Occupation: Retired    Fish farm manager: RETIRED  Social Needs  . Financial resource strain: Not hard at all  . Food insecurity:    Worry: Never true    Inability: Never true  . Transportation needs:    Medical: No    Non-medical: No  Tobacco Use  . Smoking status: Former Research scientist (life sciences)  . Smokeless tobacco: Never Used  Substance and Sexual Activity  . Alcohol use: No  . Drug use: No  . Sexual activity: Not on file  Lifestyle  . Physical activity:    Days per week: 0 days    Minutes per session: 0 min  . Stress: Not at all  Relationships  . Social connections:    Talks on phone: Never    Gets together: More than three times a week    Attends religious service: Never    Active member of club or organization: No    Attends meetings of clubs or organizations: Never    Relationship status: Widowed  . Intimate partner violence:    Fear of current or ex partner: No    Emotionally abused: No    Physically abused: No  Forced sexual activity: No  Other Topics Concern  . Not on file  Social History Narrative   Widowed   One daughter   Family History  Problem Relation Age of Onset  . Coronary artery disease Sister   . Coronary artery disease Sister   . Coronary artery disease Sister   . Heart attack Sister   . Alcohol abuse Sister       VITAL SIGNS BP 106/70   Pulse 85   Temp (!) 97.4 F (36.3 C) (Oral)   Resp 20   Ht 5\' 9"  (1.753 m)   Wt 211 lb 12.8 oz (96.1 kg)   SpO2 98%   BMI 31.28 kg/m   Outpatient Encounter Medications as of 08/01/2018  Medication Sig  . acetaminophen (TYLENOL) 325 MG tablet Take 650 mg by mouth 2 (two) times daily.  Marland Kitchen aspirin 81 MG chewable tablet Chew 81 mg by mouth daily.  Roseanne Kaufman Peru-Castor Oil (VENELEX) OINT Apply 1 application  topically See admin instructions. Apply to sacrum and bilateral buttocks each shift and prn for prevention   . cilostazol (PLETAL) 50 MG tablet Take 50 mg by mouth 2 (two) times daily.    . famotidine (PEPCID) 20 MG tablet Take 20 mg by mouth 2 (two) times daily.  Marland Kitchen gabapentin (NEURONTIN) 300 MG capsule Take 300 mg by mouth 2 (two) times daily.    Marland Kitchen ipratropium (ATROVENT) 0.02 % nebulizer solution Take 0.5 mg by nebulization every 6 (six) hours as needed for wheezing or shortness of breath.  . levothyroxine (SYNTHROID, LEVOTHROID) 50 MCG tablet Take 50 mcg by mouth daily.    . memantine (NAMENDA) 5 MG tablet Take 5 mg by mouth 2 (two) times daily.   . mirtazapine (REMERON) 15 MG tablet Take 7.5 mg by mouth at bedtime.  . Multiple Vitamins-Minerals (MULTIVITAMIN WITH MINERALS) tablet Take 1 tablet by mouth daily.  . NON FORMULARY Diet Type:  Consistent CHO Diet  . NON FORMULARY Closed Blisters - apply thin transparentt film (Tegaderm / Opsite) check every day, change every 7 days and as needed  . OXYGEN Inhale 2 L/min into the lungs continuous. Titrate as needed to maintain saturation > 90%  . sertraline (ZOLOFT) 25 MG tablet Take 25 mg by mouth at bedtime.  Derrill Memo ON 08/15/2018] sertraline (ZOLOFT) 50 MG tablet Take 50 mg by mouth at bedtime.  . [DISCONTINUED] escitalopram (LEXAPRO) 5 MG tablet Take 5 mg by mouth at bedtime.   . [DISCONTINUED] ranitidine (ZANTAC) 150 MG capsule Take 1 capsule (150 mg total) by mouth 2 (two) times daily.   No facility-administered encounter medications on file as of 08/01/2018.      SIGNIFICANT DIAGNOSTIC EXAMS   LABS REVIEWED PREVIOUS:   12-29-17: urine micro-albumin: 19.7 05-18-18: wbc 4.8; hgb 12.6; hct 44.4; mcv 85.4; plt 236; glucose 148; bun 34; creat 1.65; k+ 4.4; na++ 141; ca 8.8; liver normal albumin 3.1 TSH 1.544; hgb a1c 8.2 05-21-18: glucose 168; bun 38; creat 1.74; k+ 4.2; na++ 139; ca 9.1  05-22-18: glucose 148; bun 36; creat 1.69 k+ 4.3; na++  140 ca 8.6  05-30-18: glucose 140; bun 376 creat 1.68; k+ 4.5; na++ 139; ca 8.5  06-09-18: glucose 123 bun 43; creat 2.16;  k+ 4.2; na++ 139 ca 8.8 06-13-18: glucose 117; bun 45; creat 2.07 k+ 4.1; na++ 137; ca 9.0 06-19-18: glucose 85; bun 53; creat 2.30; k+ 4.4; na++ 138; ca 8.8 07-02-18: glucose 102; bun 85; creat 3.42; k+ 3.6; na++ 140; ca  9.3 07-05-18: wbc 4.9; hgb 11.7; hct 40.4; mcv 80.0; plt 149 glucose 91; bun 90; creat 2.83; k+ 3.9; na++ 9.3  07-09-18: glucose 100; bun 72; creat 2.10; k+ 3.7; na ++ 143; ca 9.0 07-16-18: glucose 89; bun 48; creat 1.84 k+ 3.8; na++ 145; ca 8.8 07-31-18: wbc 3.6; hgb 12.3; hct 44.0; mcv 81.0; plt 134; glucose 105; bun 26; creat 1.54; k+ 3.9; na++ 145; ca 9.0 total bili 1.5; albumin 3.2   NO NEW LABS.    Review of Systems  Unable to perform ROS: Dementia (unable to participate )    Physical Exam Constitutional:      General: She is not in acute distress.    Appearance: She is well-developed. She is obese. She is not diaphoretic.  Neck:     Thyroid: No thyromegaly.  Cardiovascular:     Rate and Rhythm: Normal rate and regular rhythm.     Pulses: Normal pulses.     Heart sounds: Normal heart sounds.     Comments: History of atrial ablation  Pulmonary:     Effort: Pulmonary effort is normal. No respiratory distress.     Breath sounds: Normal breath sounds.  Chest:     Comments: Left mastectomy  Abdominal:     General: Bowel sounds are normal. There is no distension.     Palpations: Abdomen is soft.     Tenderness: There is no abdominal tenderness.  Musculoskeletal:     Right lower leg: No edema.     Left lower leg: No edema.     Comments: Able to move all extremities   Lymphadenopathy:     Cervical: No cervical adenopathy.  Skin:    General: Skin is warm and dry.     Comments: Bilateral lower extremities discolored   Neurological:     Mental Status: She is alert. Mental status is at baseline.  Psychiatric:        Mood and Affect: Mood normal.       ASSESSMENT/ PLAN:  TODAY:   1. Dyslipidemia associated with type 2 diabetes mellitus: is stable will monitor  2. Other hypothyroidism: is stable tsh 1.544: will continue synthroid 50 mcg daily   3. CKD stage 4 due to type 2 diabetes mellitus: is stable bun 26; creat 1.54   PREVIOUS  4. Bilateral leg edema: is stable will continue to monitor is off all diuretics  5. Late onset alzheimer's disease without behavioral disturbance: is worse is losing weight. Her  weight is 211 (previous 229) pounds will continue namenda 5 mg twice daily   6. Diabetic peripheral neuropathy: is stable will continue neurontin 300 mg twice daily and tylenol 650 mg twice daily   7. Major de[ression recurrent; chronic: is without change: has started on zoloft 25 mg nightly then increase to 50 mg nightly on 08-15-18. Will continue remeron 7.5 mg nightly for 30 days to help stimulate appetite.   8. Hypertension associated with stage 4 chronic kidney disease due to type 2 diabetes mellitus: is stable b/p 106/70: will continue norvasc 5 mg daily  asa 81 mg daily  9. Peripheral vascular disease: is stable will continue pletal 50 mg twice daily tylenol 650 mg twice daily  and asa 81 mg daily  10. Controlled type 2 diabetes mellitus with stage 4 chronic kidney disase without long term current use of insulin: stable hgb a1c 8.2; will continue to monitor her status.    MD is aware of resident's narcotic use and is in agreement with  current plan of care. We will attempt to wean resident as apropriate   Ok Edwards NP [facility]  Contact [phone] Monday through Friday 8am- 5pm  After hours call [phone]

## 2018-08-06 ENCOUNTER — Encounter: Payer: Self-pay | Admitting: Internal Medicine

## 2018-08-06 ENCOUNTER — Non-Acute Institutional Stay (SKILLED_NURSING_FACILITY): Payer: Medicare Other | Admitting: Internal Medicine

## 2018-08-06 DIAGNOSIS — R627 Adult failure to thrive: Secondary | ICD-10-CM

## 2018-08-06 DIAGNOSIS — E038 Other specified hypothyroidism: Secondary | ICD-10-CM | POA: Diagnosis not present

## 2018-08-06 DIAGNOSIS — I10 Essential (primary) hypertension: Secondary | ICD-10-CM | POA: Diagnosis not present

## 2018-08-06 DIAGNOSIS — E1151 Type 2 diabetes mellitus with diabetic peripheral angiopathy without gangrene: Secondary | ICD-10-CM

## 2018-08-06 DIAGNOSIS — E1122 Type 2 diabetes mellitus with diabetic chronic kidney disease: Secondary | ICD-10-CM

## 2018-08-06 DIAGNOSIS — N184 Chronic kidney disease, stage 4 (severe): Secondary | ICD-10-CM

## 2018-08-06 NOTE — Progress Notes (Signed)
Location:  [place] Room Number: [place] of Service:  SNF [phone]) Provider:  Veleta Miners, MD  [name], MD  Patient Care Team: [name], MD as PCP - General (Internal Medicine) [name], NP as Nurse Practitioner (Geriatric Medicine)  Extended Emergency Contact Information Primary Emergency Contact: Valarie Merino Address: [place], [place] Phone: [phone] Work Phone: [phone] Relation: Daughter  Code Status:  DNR Goals of care: Advanced Directive information Advanced Directives 08/06/2018  Does Patient Have a Medical Advance Directive? Yes  Type of Advance Directive Out of facility DNR (pink MOST or yellow form)  Does patient want to make changes to medical advance directive? No - Patient declined  Copy of [place] in Chart? -  Pre-existing out of facility DNR order (yellow form or pink MOST form) Yellow form placed in chart (order not valid for inpatient use)     Chief Complaint  Patient presents with  . Acute Visit    Lethargy    HPI:  Pt is a 85 y.o. female seen today for an acute visit for Lethargy and refusing to eat. Patient has H/O Dementia, Diabetes Type 2, LE Edema, CKD stage3-4Hypothyroidism, Peripheral vascular disease and Chronic Venous stasis, Hyperlipidemia. GERD with Esophagitis.and Depression. Patient is long term resident of facility This told me today that patient has been refusing to eat for past few days.  They have tried everything and she spits it out she is even refusing her medications now. She has lost almost 15 pounds in past 4 weeks I went to see patient today she was very lethargic.  She says she is not feeling good.  She is unable to give me any detailed history.  She has not had any fever, chills cough.  She her pulse ox has been around 94% on 2 L. Patient did develop some blisters in her leg with edema few  months ago but they are now dry and healing well.   Past Medical History:  Diagnosis Date  . Acute cystitis   . Dementia ([place])   . Dermatomycosis, unspecified   . DJD (degenerative joint disease)    Spine  . DM (diabetes mellitus) ([place])   . GERD (gastroesophageal reflux disease)   . History of pulmonary embolism   . History of supraventricular tachycardia   . Hyperlipidemia   . Hypertension   . Hypothyroidism   . Long term (current) use of anticoagulants    coumadin  . Obesity   . Other malaise and fatigue   . PVD (peripheral vascular disease) ([place])    Past Surgical History:  Procedure Laterality Date  . ATRIAL ABLATION SURGERY  01/2003   Catheter, to tachic palpitations  . ESOPHAGOGASTRODUODENOSCOPY N/A 10/19/2017   Procedure: ESOPHAGOGASTRODUODENOSCOPY (EGD);  Surgeon: Rogene Houston, MD;  [facility];  Service: Endoscopy;  Laterality: N/A;  2:50  . MASTECTOMY  2003   Left  . THYROIDECTOMY, PARTIAL  1962    Allergies  Allergen Reactions  . Contrast Media [Iodinated Diagnostic Agents] Other (See Comments)    Unknown-listed on MAR-patient is not aware of these allergies  . Latex Other (See Comments)    Unknown-listed on MAR-patient is not aware of these allergies    Outpatient Encounter Medications as of 08/06/2018  Medication Sig  . acetaminophen (TYLENOL) 325 MG tablet Take 650 mg by mouth 2 (two) times daily.  Marland Kitchen  aspirin 81 MG chewable tablet Chew 81 mg by mouth daily.  Roseanne Kaufman Peru-Castor Oil (VENELEX) OINT Apply 1 application topically See admin instructions. Apply to sacrum and bilateral buttocks each shift and prn for prevention   . cilostazol (PLETAL) 50 MG tablet Take 50 mg by mouth 2 (two) times daily.    . famotidine (PEPCID) 20 MG tablet Take 20 mg by mouth 2 (two) times daily.  Marland Kitchen gabapentin (NEURONTIN) 300 MG capsule Take 300 mg by mouth 2 (two) times daily.    Marland Kitchen ipratropium (ATROVENT) 0.02 % nebulizer solution Take 0.5 mg by nebulization  every 6 (six) hours as needed for wheezing or shortness of breath.  . levothyroxine (SYNTHROID, LEVOTHROID) 50 MCG tablet Take 50 mcg by mouth daily.    . memantine (NAMENDA) 5 MG tablet Take 5 mg by mouth 2 (two) times daily.   . mirtazapine (REMERON) 15 MG tablet Take 7.5 mg by mouth at bedtime.  . Multiple Vitamins-Minerals (MULTIVITAMIN WITH MINERALS) tablet Take 1 tablet by mouth daily.  . NON FORMULARY Diet Type:  Regular  . NON FORMULARY Closed Blisters - apply thin transparentt film (Tegaderm / Opsite) check every day, change every 7 days and as needed  . Nutritional Supplements (ENSURE CLEAR) LIQD Take 1 Bottle by mouth 3 (three) times daily with meals.  . OXYGEN Inhale 2 L/min into the lungs continuous. Titrate as needed to maintain saturation > 90%  . sertraline (ZOLOFT) 25 MG tablet Take 25 mg by mouth at bedtime.  Derrill Memo ON 08/15/2018] sertraline (ZOLOFT) 50 MG tablet Take 50 mg by mouth at bedtime.   No facility-administered encounter medications on file as of 08/06/2018.     Review of Systems  Unable to perform ROS: Dementia    Immunization History  Administered Date(s) Administered  . Influenza Whole 10/11/2006  . Influenza-Unspecified 03/19/2009, 12/30/2016  . Pneumococcal Conjugate-13 12/30/2016, 06/22/2018  . Td 01/14/2003  . Tdap 12/15/2016   Pertinent  Health Maintenance Due  Topic Date Due  . FOOT EXAM  09/26/2018  . INFLUENZA VACCINE  10/27/2018  . HEMOGLOBIN A1C  11/16/2018  . URINE MICROALBUMIN  12/30/2018  . OPHTHALMOLOGY EXAM  01/31/2019  . PNA vac Low Risk Adult (2 of 2 - PPSV23) 06/22/2019  . DEXA SCAN  Completed   Fall Risk  12/28/2017 12/15/2016  Falls in the past year? No No   Functional Status Survey:    Vitals:   08/06/18 1533  Weight: 208 lb 3.2 oz (94.4 kg)  Height: 5\' 9"  (1.753 m)   Body mass index is 30.75 kg/m. Physical Exam Vitals signs reviewed.  HENT:     Head: Normocephalic.     Nose: Nose normal.     Mouth/Throat:      Mouth: Mucous membranes are dry.  Eyes:     Pupils: Pupils are equal, round, and reactive to light.  Cardiovascular:     Rate and Rhythm: Normal rate and regular rhythm.     Pulses: Normal pulses.     Heart sounds: Normal heart sounds.  Pulmonary:     Effort: Pulmonary effort is normal. No respiratory distress.     Breath sounds: Normal breath sounds. No wheezing or rales.  Abdominal:     General: Abdomen is flat. Bowel sounds are normal.     Palpations: Abdomen is soft.  Musculoskeletal:     Comments: Mild Edema with Healing Blisters in Right LE  Skin:    General: Skin is warm and dry.  Neurological:  General: No focal deficit present.     Mental Status: She is lethargic.  Psychiatric:        Mood and Affect: Mood normal.        Thought Content: Thought content normal.     Labs reviewed: Recent Labs    07/09/18 0700 07/16/18 0700 07/31/18 1037  NA 143 145 145  K 3.7 3.8 3.9  CL 98 100 99  CO2 35* 33* 36*  GLUCOSE 100* 89 105*  BUN 72* 48* 26*  CREATININE 2.10* 1.84* 1.54*  CALCIUM 9.0 8.8* 9.0   Recent Labs    05/18/18 0700 07/31/18 1037  AST 23 21  ALT 14 12  ALKPHOS 74 41  BILITOT 0.9 1.5*  PROT 7.3 6.7  ALBUMIN 3.1* 3.2*   Recent Labs    05/18/18 0700 07/05/18 0420 07/31/18 1037  WBC 4.8 5.9 3.6*  NEUTROABS 3.0 4.0 2.0  HGB 12.6 11.7* 12.3  HCT 44.4 40.4 44.0  MCV 85.4 80.0 81.0  PLT 236 149* 134*   Lab Results  Component Value Date   TSH 1.544 05/18/2018   Lab Results  Component Value Date   HGBA1C 8.2 (H) 05/18/2018   Lab Results  Component Value Date   CHOL 150 11/26/2016   HDL 45 11/26/2016   LDLCALC 85 11/26/2016   TRIG 99 11/26/2016   CHOLHDL 3.3 11/26/2016    Significant Diagnostic Results in last 30 days:  No results found.  Assessment/Plan Patient with change in status with weight loss and refusing to eat I called patient's daughter and told her that she probably has to go to the hospital or I had to get stat labs  on her.to rule out Infection or other causes for her leathrgy Her daughter does not want anything aggressive done. she said that her mom is tired and probably depressed.  She wants to make sure her mom is comfortable and no more Labs The MOST form signed and made her  comfort care. Will discontinue Pletal, Neurontin and Namenda Started on Ativan Prn Continue to monitor her.  D/W the DON to see If her Daughter can come and visit her mom   Family/ staff Communication:   Labs/tests ordered:   Total time spent in this patient care encounter was  25_  minutes; greater than 50% of the visit spent counseling patient and staff, reviewing records , Labs and coordinating care for problems addressed at this encounter.

## 2018-08-07 ENCOUNTER — Other Ambulatory Visit: Payer: Self-pay | Admitting: Adult Health

## 2018-08-07 ENCOUNTER — Other Ambulatory Visit: Payer: Self-pay | Admitting: Internal Medicine

## 2018-08-07 MED ORDER — LORAZEPAM 0.5 MG PO TABS: 0.5000 mg | ORAL_TABLET | ORAL | 0 refills | Status: DC | PRN

## 2018-08-10 ENCOUNTER — Encounter: Payer: Self-pay | Admitting: Adult Health

## 2018-08-10 NOTE — Progress Notes (Signed)
Location:    [place] Room Number: 151/P Place of Service:  SNF (31)   CODE STATUS: DNR  Allergies  Allergen Reactions  . Contrast Media [Iodinated Diagnostic Agents] Other (See Comments)    Unknown-listed on MAR-patient is not aware of these allergies  . Latex Other (See Comments)    Unknown-listed on MAR-patient is not aware of these allergies    Chief Complaint  Patient presents with  . Acute Visit    PRN Anxiety    HPI:    Past Medical History:  Diagnosis Date  . Acute cystitis   . Dementia ([place])   . Dermatomycosis, unspecified   . DJD (degenerative joint disease)    Spine  . DM (diabetes mellitus) ([place])   . GERD (gastroesophageal reflux disease)   . History of pulmonary embolism   . History of supraventricular tachycardia   . Hyperlipidemia   . Hypertension   . Hypothyroidism   . Long term (current) use of anticoagulants    coumadin  . Obesity   . Other malaise and fatigue   . PVD (peripheral vascular disease) ([place])     Past Surgical History:  Procedure Laterality Date  . ATRIAL ABLATION SURGERY  01/2003   Catheter, to tachic palpitations  . ESOPHAGOGASTRODUODENOSCOPY N/A 10/10/2017   Procedure: ESOPHAGOGASTRODUODENOSCOPY (EGD);  Surgeon: Rogene Houston, MD;  Location[facility];  Service: Endoscopy;  Laterality: N/A;  2:50  . MASTECTOMY  2003   Left  . THYROIDECTOMY, PARTIAL  1962    Social History   Socioeconomic History  . Marital status: Widowed    Spouse name: Not on file  . Number of children: Not on file  . Years of education: Not on file  . Highest education level: Not on file  Occupational History  . Occupation: Retired    Fish farm manager: RETIRED  Social Needs  . Financial resource strain: Not hard at all  . Food insecurity:    Worry: Never true    Inability: Never true  . Transportation needs:    Medical: No    Non-medical: No  Tobacco Use  . Smoking status: Former Research scientist (life sciences)  . Smokeless tobacco: Never  Used  Substance and Sexual Activity  . Alcohol use: No  . Drug use: No  . Sexual activity: Not on file  Lifestyle  . Physical activity:    Days per week: 0 days    Minutes per session: 0 min  . Stress: Not at all  Relationships  . Social connections:    Talks on phone: Never    Gets together: More than three times a week    Attends religious service: Never    Active member of club or organization: No    Attends meetings of clubs or organizations: Never    Relationship status: Widowed  . Intimate partner violence:    Fear of current or ex partner: No    Emotionally abused: No    Physically abused: No    Forced sexual activity: No  Other Topics Concern  . Not on file  Social History Narrative   Widowed   One daughter   Family History  Problem Relation Age of Onset  . Coronary artery disease Sister   . Coronary artery disease Sister   . Coronary artery disease Sister   . Heart attack Sister   . Alcohol abuse Sister       VITAL SIGNS BP 114/73   Pulse 85   Temp 98.3 F (36.8  C) (Oral)   Resp 20   Ht 5\' 9"  (1.753 m)   Wt 208 lb 3.2 oz (94.4 kg)   SpO2 98%   BMI 30.75 kg/m   Outpatient Encounter Medications as of 08/10/2018  Medication Sig  . acetaminophen (TYLENOL) 325 MG tablet Take 650 mg by mouth 2 (two) times daily.  Roseanne Kaufman Peru-Castor Oil (VENELEX) OINT Apply 1 application topically See admin instructions. Apply to sacrum and bilateral buttocks each shift and prn for prevention   . ipratropium (ATROVENT) 0.02 % nebulizer solution Take 0.5 mg by nebulization every 6 (six) hours as needed for wheezing or shortness of breath.  . levothyroxine (SYNTHROID, LEVOTHROID) 50 MCG tablet Take 50 mcg by mouth daily.    Marland Kitchen LORazepam (ATIVAN) 0.5 MG tablet Take 1 tablet (0.5 mg total) by mouth every 4 (four) hours as needed for up to 30 doses for anxiety.  . mirtazapine (REMERON) 15 MG tablet Take 7.5 mg by mouth at bedtime.  . Multiple Vitamins-Minerals (MULTIVITAMIN  WITH MINERALS) tablet Take 1 tablet by mouth daily.  . NON FORMULARY Diet Type:  Regular  . NON FORMULARY Closed Blisters - apply thin transparentt film (Tegaderm / Opsite) check every day, change every 7 days and as needed  . Nutritional Supplements (ENSURE CLEAR) LIQD Take 1 Bottle by mouth 3 (three) times daily with meals.  . OXYGEN Inhale 2 L/min into the lungs continuous. Titrate as needed to maintain saturation > 90%  . [DISCONTINUED] aspirin 81 MG chewable tablet Chew 81 mg by mouth daily.  . [DISCONTINUED] cilostazol (PLETAL) 50 MG tablet Take 50 mg by mouth 2 (two) times daily.    . [DISCONTINUED] famotidine (PEPCID) 20 MG tablet Take 20 mg by mouth 2 (two) times daily.  . [DISCONTINUED] gabapentin (NEURONTIN) 300 MG capsule Take 300 mg by mouth 2 (two) times daily.    . [DISCONTINUED] memantine (NAMENDA) 5 MG tablet Take 5 mg by mouth 2 (two) times daily.   . [DISCONTINUED] sertraline (ZOLOFT) 25 MG tablet Take 25 mg by mouth at bedtime.  . [DISCONTINUED] sertraline (ZOLOFT) 50 MG tablet Take 50 mg by mouth at bedtime.   No facility-administered encounter medications on file as of 08/10/2018.      SIGNIFICANT DIAGNOSTIC EXAMS       ASSESSMENT/ PLAN:    MD is aware of resident's narcotic use and is in agreement with current plan of care. We will attempt to wean resident as apropriate   Ok Edwards NP [facility]  Contact [phone] Monday through Friday 8am- 5pm  After hours call [phone]

## 2018-08-13 NOTE — Progress Notes (Signed)
This encounter was created in error - please disregard.

## 2018-08-23 ENCOUNTER — Encounter: Payer: Self-pay | Admitting: Adult Health

## 2018-08-23 ENCOUNTER — Non-Acute Institutional Stay (SKILLED_NURSING_FACILITY): Payer: Medicare Other | Admitting: Adult Health

## 2018-08-23 DIAGNOSIS — F419 Anxiety disorder, unspecified: Secondary | ICD-10-CM | POA: Diagnosis not present

## 2018-08-23 NOTE — Progress Notes (Signed)
Location:   [place] Room Number: 151 P Place of Service:  SNF (31)   CODE STATUS: DNR  Allergies  Allergen Reactions  . Contrast Media [Iodinated Diagnostic Agents] Other (See Comments)    Unknown-listed on MAR-patient is not aware of these allergies  . Latex Other (See Comments)    Unknown-listed on MAR-patient is not aware of these allergies    Chief Complaint  Patient presents with  . Acute Visit    Anxiety    HPI:  She had been prescribed ativan as needed 2 weeks ago for anxiety. She has not required this medication. There are no reports of anxiety or agitation. No reports of uncontrolled pain; no fevers.   Past Medical History:  Diagnosis Date  . Acute cystitis   . Dementia ([place])   . Dermatomycosis, unspecified   . DJD (degenerative joint disease)    Spine  . DM (diabetes mellitus) ([place])   . GERD (gastroesophageal reflux disease)   . History of pulmonary embolism   . History of supraventricular tachycardia   . Hyperlipidemia   . Hypertension   . Hypothyroidism   . Long term (current) use of anticoagulants    coumadin  . Obesity   . Other malaise and fatigue   . PVD (peripheral vascular disease) ([place])     Past Surgical History:  Procedure Laterality Date  . ATRIAL ABLATION SURGERY  01/2003   Catheter, to tachic palpitations  . ESOPHAGOGASTRODUODENOSCOPY N/A 10/01/2017   Procedure: ESOPHAGOGASTRODUODENOSCOPY (EGD);  Surgeon: Rogene Houston, MD;  [facility];  Service: Endoscopy;  Laterality: N/A;  2:50  . MASTECTOMY  2003   Left  . THYROIDECTOMY, PARTIAL  1962    Social History   Socioeconomic History  . Marital status: Widowed    Spouse name: Not on file  . Number of children: Not on file  . Years of education: Not on file  . Highest education level: Not on file  Occupational History  . Occupation: Retired    Fish farm manager: RETIRED  Social Needs  . Financial resource strain: Not hard at all  . Food  insecurity:    Worry: Never true    Inability: Never true  . Transportation needs:    Medical: No    Non-medical: No  Tobacco Use  . Smoking status: Former Research scientist (life sciences)  . Smokeless tobacco: Never Used  Substance and Sexual Activity  . Alcohol use: No  . Drug use: No  . Sexual activity: Not on file  Lifestyle  . Physical activity:    Days per week: 0 days    Minutes per session: 0 min  . Stress: Not at all  Relationships  . Social connections:    Talks on phone: Never    Gets together: More than three times a week    Attends religious service: Never    Active member of club or organization: No    Attends meetings of clubs or organizations: Never    Relationship status: Widowed  . Intimate partner violence:    Fear of current or ex partner: No    Emotionally abused: No    Physically abused: No    Forced sexual activity: No  Other Topics Concern  . Not on file  Social History Narrative   Widowed   One daughter   Family History  Problem Relation Age of Onset  . Coronary artery disease Sister   . Coronary artery disease Sister   . Coronary artery disease  Sister   . Heart attack Sister   . Alcohol abuse Sister       VITAL SIGNS BP 119/87   Pulse 87   Temp (!) 97.3 F (36.3 C)   Resp 20   Ht 5\' 9"  (1.753 m)   Wt 208 lb 3.2 oz (94.4 kg)   BMI 30.75 kg/m   Outpatient Encounter Medications as of 08/23/2018  Medication Sig  . acetaminophen (TYLENOL) 325 MG tablet Take 650 mg by mouth 2 (two) times daily.  Roseanne Kaufman Peru-Castor Oil (VENELEX) OINT Apply 1 application topically See admin instructions. Apply to sacrum and bilateral buttocks each shift and prn for prevention   . ipratropium (ATROVENT) 0.02 % nebulizer solution Take 0.5 mg by nebulization every 6 (six) hours as needed for wheezing or shortness of breath.  . levothyroxine (SYNTHROID, LEVOTHROID) 50 MCG tablet Take 50 mcg by mouth daily.    Marland Kitchen LORazepam (ATIVAN) 0.5 MG tablet Take 1 tablet (0.5 mg total) by  mouth every 4 (four) hours as needed for up to 30 doses for anxiety.  . mirtazapine (REMERON) 15 MG tablet Take 7.5 mg by mouth at bedtime.  . Multiple Vitamins-Minerals (MULTIVITAMIN WITH MINERALS) tablet Take 1 tablet by mouth daily.  . NON FORMULARY Diet Type:  Regular  . NON FORMULARY Closed Blisters - apply thin transparentt film (Tegaderm / Opsite) check every day, change every 7 days and as needed  . Nutritional Supplements (ENSURE CLEAR) LIQD Take 1 Bottle by mouth 3 (three) times daily with meals.  . OXYGEN Inhale 2 L/min into the lungs continuous. Titrate as needed to maintain saturation > 90%   No facility-administered encounter medications on file as of 08/23/2018.      SIGNIFICANT DIAGNOSTIC EXAMS  LABS REVIEWED PREVIOUS:   12-29-17: urine micro-albumin: 19.7 05-18-18: wbc 4.8; hgb 12.6; hct 44.4; mcv 85.4; plt 236; glucose 148; bun 34; creat 1.65; k+ 4.4; na++ 141; ca 8.8; liver normal albumin 3.1 TSH 1.544; hgb a1c 8.2 05-21-18: glucose 168; bun 38; creat 1.74; k+ 4.2; na++ 139; ca 9.1  05-22-18: glucose 148; bun 36; creat 1.69 k+ 4.3; na++ 140 ca 8.6  05-30-18: glucose 140; bun 376 creat 1.68; k+ 4.5; na++ 139; ca 8.5  06-09-18: glucose 123 bun 43; creat 2.16;  k+ 4.2; na++ 139 ca 8.8 06-13-18: glucose 117; bun 45; creat 2.07 k+ 4.1; na++ 137; ca 9.0 06-19-18: glucose 85; bun 53; creat 2.30; k+ 4.4; na++ 138; ca 8.8 07-02-18: glucose 102; bun 85; creat 3.42; k+ 3.6; na++ 140; ca 9.3 07-05-18: wbc 4.9; hgb 11.7; hct 40.4; mcv 80.0; plt 149 glucose 91; bun 90; creat 2.83; k+ 3.9; na++ 9.3  07-09-18: glucose 100; bun 72; creat 2.10; k+ 3.7; na ++ 143; ca 9.0 07-16-18: glucose 89; bun 48; creat 1.84 k+ 3.8; na++ 145; ca 8.8 07-31-18: wbc 3.6; hgb 12.3; hct 44.0; mcv 81.0; plt 134; glucose 105; bun 26; creat 1.54; k+ 3.9; na++ 145; ca 9.0 total bili 1.5; albumin 3.2   NO NEW LABS.    Review of Systems  Unable to perform ROS: Dementia (unable to participate )   Physical Exam  Constitutional:      General: She is not in acute distress.    Appearance: She is well-developed. She is obese. She is not diaphoretic.  Neck:     Musculoskeletal: Neck supple.     Thyroid: No thyromegaly.  Cardiovascular:     Rate and Rhythm: Normal rate and regular rhythm.  Pulses: Normal pulses.     Heart sounds: Normal heart sounds.     Comments: History of atrial ablation  Pulmonary:     Effort: Pulmonary effort is normal. No respiratory distress.     Breath sounds: Normal breath sounds.  Chest:     Comments: Left mastectomy  Abdominal:     General: Bowel sounds are normal. There is no distension.     Palpations: Abdomen is soft.     Tenderness: There is no abdominal tenderness.  Musculoskeletal:     Right lower leg: No edema.     Left lower leg: No edema.     Comments: Is able to move all extremities   Lymphadenopathy:     Cervical: No cervical adenopathy.  Skin:    General: Skin is warm and dry.     Comments: Bilateral lower extremities discolored   Neurological:     Mental Status: She is alert. Mental status is at baseline.  Psychiatric:        Mood and Affect: Mood normal.      ASSESSMENT/ PLAN:  TODAY:   1. Anxiety: is stable will stop ativan at this time and will monitor her status.   MD is aware of resident's narcotic use and is in agreement with current plan of care. We will attempt to wean resident as apropriate   Ok Edwards NP [facility]  Contact [phone] Monday through Friday 8am- 5pm  After hours call [phone]

## 2018-08-30 ENCOUNTER — Encounter: Payer: Self-pay | Admitting: Internal Medicine

## 2018-08-30 ENCOUNTER — Non-Acute Institutional Stay (SKILLED_NURSING_FACILITY): Payer: Medicare Other | Admitting: Internal Medicine

## 2018-08-30 DIAGNOSIS — I129 Hypertensive chronic kidney disease with stage 1 through stage 4 chronic kidney disease, or unspecified chronic kidney disease: Secondary | ICD-10-CM | POA: Diagnosis not present

## 2018-08-30 DIAGNOSIS — I739 Peripheral vascular disease, unspecified: Secondary | ICD-10-CM | POA: Diagnosis not present

## 2018-08-30 DIAGNOSIS — E1122 Type 2 diabetes mellitus with diabetic chronic kidney disease: Secondary | ICD-10-CM | POA: Diagnosis not present

## 2018-08-30 DIAGNOSIS — N184 Chronic kidney disease, stage 4 (severe): Secondary | ICD-10-CM

## 2018-08-30 DIAGNOSIS — E038 Other specified hypothyroidism: Secondary | ICD-10-CM

## 2018-08-30 NOTE — Progress Notes (Signed)
Location:  [place] Room Number: [place] of Service:  SNF [phone]) Provider:  Veleta Miners, MD  [name], MD  Patient Care Team: [name], MD as PCP - General (Internal Medicine) [name], NP as Nurse Practitioner (Geriatric Medicine)  Extended Emergency Contact Information Primary Emergency Contact: Valarie Merino Address: [place], [place] Phone: [phone] Work Phone: [phone] Relation: Daughter  Code Status:  DNR Goals of care: Advanced Directive information Advanced Directives 08/30/2018  Does Patient Have a Medical Advance Directive? Yes  Type of Advance Directive Out of facility DNR (pink MOST or yellow form)  Does patient want to make changes to medical advance directive? No - Patient declined  Copy of [place] in Chart? -  Pre-existing out of facility DNR order (yellow form or pink MOST form) Yellow form placed in chart (order not valid for inpatient use)     Chief Complaint  Patient presents with  . Medical Management of Chronic Issues    Hypertension, Hypothyroidism    HPI:  Pt is a 85 y.o. female seen today for medical management of chronic diseases.    Patient has H/O Dementia, Diabetes Type 2, LE Edema, CKD stage3-4Hypothyroidism, Peripheral vascular disease and Chronic Venous stasis, Hyperlipidemia. GERD with Esophagitis.and Depression. Patient is long term resident of facility Recently she has not been eating and refuses to take her Meds She was in her Bed . Did not have any acute complains. Was alert and Pleasantly confused Has lost almost 10 lbs in past few weeks We had talked to her Daughter and she does not want any Aggressive testing or therapy. She has MOST form in her Chart   Past Medical History:  Diagnosis Date  . Acute cystitis   . Dementia ([place])   . Dermatomycosis, unspecified   . DJD (degenerative joint  disease)    Spine  . DM (diabetes mellitus) ([place])   . GERD (gastroesophageal reflux disease)   . History of pulmonary embolism   . History of supraventricular tachycardia   . Hyperlipidemia   . Hypertension   . Hypothyroidism   . Long term (current) use of anticoagulants    coumadin  . Obesity   . Other malaise and fatigue   . PVD (peripheral vascular disease) ([place])    Past Surgical History:  Procedure Laterality Date  . ATRIAL ABLATION SURGERY  01/2003   Catheter, to tachic palpitations  . ESOPHAGOGASTRODUODENOSCOPY N/A 10/04/2017   Procedure: ESOPHAGOGASTRODUODENOSCOPY (EGD);  Surgeon: Rogene Houston, MD;  [facility];  Service: Endoscopy;  Laterality: N/A;  2:50  . MASTECTOMY  2003   Left  . THYROIDECTOMY, PARTIAL  1962    Allergies  Allergen Reactions  . Contrast Media [Iodinated Diagnostic Agents] Other (See Comments)    Unknown-listed on MAR-patient is not aware of these allergies  . Latex Other (See Comments)    Unknown-listed on MAR-patient is not aware of these allergies    Outpatient Encounter Medications as of 08/30/2018  Medication Sig  . acetaminophen (TYLENOL) 325 MG tablet Take 650 mg by mouth 2 (two) times daily.  Roseanne Kaufman Peru-Castor Oil (VENELEX) OINT Apply 1 application topically See admin instructions. Apply to sacrum and bilateral buttocks each shift and prn for prevention   . ipratropium (ATROVENT) 0.02 % nebulizer solution Take 0.5 mg by nebulization every 6 (six) hours as needed for wheezing  or shortness of breath.  . levothyroxine (SYNTHROID, LEVOTHROID) 50 MCG tablet Take 50 mcg by mouth daily.    . mirtazapine (REMERON) 15 MG tablet Take 7.5 mg by mouth at bedtime.  . Multiple Vitamins-Minerals (MULTIVITAMIN WITH MINERALS) tablet Take 1 tablet by mouth daily.  . NON FORMULARY Diet Type:  Regular  . NON FORMULARY Closed Blisters - apply thin transparentt film (Tegaderm / Opsite) check every day, change every 7 days and as needed  .  Nutritional Supplements (ENSURE CLEAR) LIQD Take 1 Bottle by mouth 3 (three) times daily with meals.  . OXYGEN Inhale 2 L/min into the lungs continuous. Titrate as needed to maintain saturation > 90%  . [DISCONTINUED] LORazepam (ATIVAN) 0.5 MG tablet Take 1 tablet (0.5 mg total) by mouth every 4 (four) hours as needed for up to 30 doses for anxiety. (Patient not taking: Reported on 08/30/2018)   No facility-administered encounter medications on file as of 08/30/2018.     Review of Systems  Unable to perform ROS: Dementia    Immunization History  Administered Date(s) Administered  . Influenza Whole 10/11/2006  . Influenza-Unspecified 03/19/2009, 12/30/2016  . Pneumococcal Conjugate-13 12/30/2016, 06/22/2018  . Td 01/14/2003  . Tdap 12/15/2016   Pertinent  Health Maintenance Due  Topic Date Due  . FOOT EXAM  09/26/2018  . INFLUENZA VACCINE  10/27/2018  . HEMOGLOBIN A1C  11/16/2018  . URINE MICROALBUMIN  12/30/2018  . OPHTHALMOLOGY EXAM  01/31/2019  . PNA vac Low Risk Adult (2 of 2 - PPSV23) 06/22/2019  . DEXA SCAN  Completed   Fall Risk  12/28/2017 12/15/2016  Falls in the past year? No No   Functional Status Survey:    Vitals:   08/30/18 0955  BP: 125/69  Pulse: 96  Resp: 20  Temp: 98.6 F (37 C)  Weight: 199 lb 6.4 oz (90.4 kg)  Height: 5\' 9"  (1.753 m)   Body mass index is 29.45 kg/m. Physical Exam Vitals signs reviewed.  Constitutional:      Appearance: Normal appearance.  HENT:     Head: Normocephalic.     Nose: Nose normal.     Mouth/Throat:     Mouth: Mucous membranes are moist.     Pharynx: Oropharynx is clear.  Eyes:     Pupils: Pupils are equal, round, and reactive to light.  Cardiovascular:     Rate and Rhythm: Normal rate.  Pulmonary:     Effort: Pulmonary effort is normal. No respiratory distress.     Breath sounds: Normal breath sounds. No wheezing.  Abdominal:     General: Abdomen is flat. Bowel sounds are normal. There is no distension.      Palpations: Abdomen is soft.     Tenderness: There is no abdominal tenderness.  Musculoskeletal:     Comments: Has Mild swelling but Blisters are resolved  Skin:    General: Skin is warm and dry.  Neurological:     General: No focal deficit present.     Mental Status: She is alert.     Comments: She is not oriented. But does respond Apporpriately  Psychiatric:        Mood and Affect: Mood normal.        Thought Content: Thought content normal.     Labs reviewed: Recent Labs    07/09/18 0700 07/16/18 0700 07/31/18 1037  NA 143 145 145  K 3.7 3.8 3.9  CL 98 100 99  CO2 35* 33* 36*  GLUCOSE 100* 89 105*  BUN 72* 48* 26*  CREATININE 2.10* 1.84* 1.54*  CALCIUM 9.0 8.8* 9.0   Recent Labs    05/18/18 0700 07/31/18 1037  AST 23 21  ALT 14 12  ALKPHOS 74 41  BILITOT 0.9 1.5*  PROT 7.3 6.7  ALBUMIN 3.1* 3.2*   Recent Labs    05/18/18 0700 07/05/18 0420 07/31/18 1037  WBC 4.8 5.9 3.6*  NEUTROABS 3.0 4.0 2.0  HGB 12.6 11.7* 12.3  HCT 44.4 40.4 44.0  MCV 85.4 80.0 81.0  PLT 236 149* 134*   Lab Results  Component Value Date   TSH 1.544 05/18/2018   Lab Results  Component Value Date   HGBA1C 8.2 (H) 05/18/2018   Lab Results  Component Value Date   CHOL 150 11/26/2016   HDL 45 11/26/2016   LDLCALC 85 11/26/2016   TRIG 99 11/26/2016   CHOLHDL 3.3 11/26/2016    Significant Diagnostic Results in last 30 days:  No results found.  Assessment/Plan LE edema She is off all diuretics due to Weight loss and AKD Her blisters have now healed and edema is Mild CKD Creat Back to her baseline Family does not want aggressive work up Diabetes mellitus Was on Tradjenta and glipizide Stopped due to not eating  Hypothyroid Continue Thyroid Supplement Depression with Failure to thrive On Remeron 7.5 mg Continues to loose weight Have talked to Nurses to continue with supplements and Liberalize her diet  Family/ staff Communication:   Labs/tests ordered:     Total time spent in this patient care encounter was  25_  minutes; greater than 50% of the visit spent counseling patient and staff, reviewing records , Labs and coordinating care for problems addressed at this encounter.

## 2018-09-21 ENCOUNTER — Non-Acute Institutional Stay (SKILLED_NURSING_FACILITY): Payer: Medicare Other | Admitting: Adult Health

## 2018-09-21 ENCOUNTER — Encounter: Payer: Self-pay | Admitting: Adult Health

## 2018-09-21 ENCOUNTER — Other Ambulatory Visit: Payer: Self-pay | Admitting: Adult Health

## 2018-09-21 DIAGNOSIS — R600 Localized edema: Secondary | ICD-10-CM

## 2018-09-21 DIAGNOSIS — N184 Chronic kidney disease, stage 4 (severe): Secondary | ICD-10-CM

## 2018-09-21 DIAGNOSIS — I739 Peripheral vascular disease, unspecified: Secondary | ICD-10-CM

## 2018-09-21 DIAGNOSIS — I129 Hypertensive chronic kidney disease with stage 1 through stage 4 chronic kidney disease, or unspecified chronic kidney disease: Secondary | ICD-10-CM | POA: Diagnosis not present

## 2018-09-21 DIAGNOSIS — E1122 Type 2 diabetes mellitus with diabetic chronic kidney disease: Secondary | ICD-10-CM | POA: Diagnosis not present

## 2018-09-21 MED ORDER — LORAZEPAM 0.5 MG PO TABS: 0.5000 mg | ORAL_TABLET | Freq: Every day | ORAL | 0 refills | Status: DC

## 2018-09-21 NOTE — Progress Notes (Signed)
Location:   [place] Room Number: [place] of Service:  SNF (31)   CODE STATUS: DNR  Allergies  Allergen Reactions  . Contrast Media [Iodinated Diagnostic Agents] Other (See Comments)    Unknown-listed on MAR-patient is not aware of these allergies  . Latex Other (See Comments)    Unknown-listed on MAR-patient is not aware of these allergies    Chief Complaint  Patient presents with  . Acute Visit    Pain / Swelling    HPI:  She has been up all night yelling out in pain. Today she is very lethargic. Staff report that she is having increased edema present in both lower extremities. There are no reports of increased shortness of breath or coughing present. There are no reports of fevers present.   Past Medical History:  Diagnosis Date  . Acute cystitis   . Dementia ([place])   . Dermatomycosis, unspecified   . DJD (degenerative joint disease)    Spine  . DM (diabetes mellitus) ([place])   . GERD (gastroesophageal reflux disease)   . History of pulmonary embolism   . History of supraventricular tachycardia   . Hyperlipidemia   . Hypertension   . Hypothyroidism   . Long term (current) use of anticoagulants    coumadin  . Obesity   . Other malaise and fatigue   . PVD (peripheral vascular disease) ([place])     Past Surgical History:  Procedure Laterality Date  . ATRIAL ABLATION SURGERY  01/2003   Catheter, to tachic palpitations  . ESOPHAGOGASTRODUODENOSCOPY N/A 09/25/2017   Procedure: ESOPHAGOGASTRODUODENOSCOPY (EGD);  Surgeon: Rogene Houston, MD;  [facility];  Service: Endoscopy;  Laterality: N/A;  2:50  . MASTECTOMY  2003   Left  . THYROIDECTOMY, PARTIAL  1962    Social History   Socioeconomic History  . Marital status: Widowed    Spouse name: Not on file  . Number of children: Not on file  . Years of education: Not on file  . Highest education level: Not on file  Occupational History  . Occupation: Retired   Fish farm manager: RETIRED  Social Needs  . Financial resource strain: Not hard at all  . Food insecurity    Worry: Never true    Inability: Never true  . Transportation needs    Medical: No    Non-medical: No  Tobacco Use  . Smoking status: Former Research scientist (life sciences)  . Smokeless tobacco: Never Used  Substance and Sexual Activity  . Alcohol use: No  . Drug use: No  . Sexual activity: Not on file  Lifestyle  . Physical activity    Days per week: 0 days    Minutes per session: 0 min  . Stress: Not at all  Relationships  . Social Herbalist on phone: Never    Gets together: More than three times a week    Attends religious service: Never    Active member of club or organization: No    Attends meetings of clubs or organizations: Never    Relationship status: Widowed  . Intimate partner violence    Fear of current or ex partner: No    Emotionally abused: No    Physically abused: No    Forced sexual activity: No  Other Topics Concern  . Not on file  Social History Narrative   Widowed   One daughter   Family History  Problem Relation Age of Onset  . Coronary artery disease  Sister   . Coronary artery disease Sister   . Coronary artery disease Sister   . Heart attack Sister   . Alcohol abuse Sister       VITAL SIGNS Ht 5\' 9"  (1.753 m)   Wt 199 lb 6.4 oz (90.4 kg)   BMI 29.45 kg/m   Outpatient Encounter Medications as of 09/21/2018  Medication Sig  . acetaminophen (TYLENOL) 325 MG tablet Take 650 mg by mouth 2 (two) times daily.  Roseanne Kaufman Peru-Castor Oil (VENELEX) OINT Apply 1 application topically See admin instructions. Apply to sacrum and bilateral buttocks each shift and prn for prevention   . ipratropium (ATROVENT) 0.02 % nebulizer solution Take 0.5 mg by nebulization every 6 (six) hours as needed for wheezing or shortness of breath.  . levothyroxine (SYNTHROID, LEVOTHROID) 50 MCG tablet Take 50 mcg by mouth daily.    . Multiple Vitamins-Minerals (MULTIVITAMIN WITH  MINERALS) tablet Take 1 tablet by mouth daily.  . NON FORMULARY Diet Type:  Regular  . NON FORMULARY Closed Blisters - apply thin transparentt film (Tegaderm / Opsite) check every day, change every 7 days and as needed  . Nutritional Supplements (ENSURE CLEAR) LIQD Take 1 Bottle by mouth 3 (three) times daily with meals.  . OXYGEN Inhale 2 L/min into the lungs continuous. Titrate as needed to maintain saturation > 90%  . [DISCONTINUED] mirtazapine (REMERON) 15 MG tablet Take 7.5 mg by mouth at bedtime.   No facility-administered encounter medications on file as of 09/21/2018.      SIGNIFICANT DIAGNOSTIC EXAMS   LABS REVIEWED PREVIOUS:   12-29-17: urine micro-albumin: 19.7 05-18-18: wbc 4.8; hgb 12.6; hct 44.4; mcv 85.4; plt 236; glucose 148; bun 34; creat 1.65; k+ 4.4; na++ 141; ca 8.8; liver normal albumin 3.1 TSH 1.544; hgb a1c 8.2 05-21-18: glucose 168; bun 38; creat 1.74; k+ 4.2; na++ 139; ca 9.1  05-22-18: glucose 148; bun 36; creat 1.69 k+ 4.3; na++ 140 ca 8.6  05-30-18: glucose 140; bun 376 creat 1.68; k+ 4.5; na++ 139; ca 8.5  06-09-18: glucose 123 bun 43; creat 2.16;  k+ 4.2; na++ 139 ca 8.8 06-13-18: glucose 117; bun 45; creat 2.07 k+ 4.1; na++ 137; ca 9.0 06-19-18: glucose 85; bun 53; creat 2.30; k+ 4.4; na++ 138; ca 8.8 07-02-18: glucose 102; bun 85; creat 3.42; k+ 3.6; na++ 140; ca 9.3 07-05-18: wbc 4.9; hgb 11.7; hct 40.4; mcv 80.0; plt 149 glucose 91; bun 90; creat 2.83; k+ 3.9; na++ 9.3  07-09-18: glucose 100; bun 72; creat 2.10; k+ 3.7; na ++ 143; ca 9.0 07-16-18: glucose 89; bun 48; creat 1.84 k+ 3.8; na++ 145; ca 8.8 07-31-18: wbc 3.6; hgb 12.3; hct 44.0; mcv 81.0; plt 134; glucose 105; bun 26; creat 1.54; k+ 3.9; na++ 145; ca 9.0 total bili 1.5; albumin 3.2   NO NEW LABS.    Review of Systems  Unable to perform ROS: Dementia (unable to participate )     Physical Exam Constitutional:      General: She is not in acute distress.    Appearance: She is well-developed and  overweight. She is not diaphoretic.  Neck:     Musculoskeletal: Neck supple.     Thyroid: No thyromegaly.  Cardiovascular:     Rate and Rhythm: Normal rate and regular rhythm.     Heart sounds: Normal heart sounds.     Comments: History of atrial ablation  Unable to palpate pedal pulses due to edema  Pulmonary:     Effort: Pulmonary  effort is normal. No respiratory distress.     Breath sounds: Normal breath sounds.  Chest:     Comments: Left mastectomy  Abdominal:     General: Bowel sounds are normal. There is no distension.     Palpations: Abdomen is soft.     Tenderness: There is no abdominal tenderness.  Musculoskeletal:     Right lower leg: Edema present.     Left lower leg: Edema present.     Comments: Has 3-4+ bilateral lower extremities Is not moving lower extremities   Lymphadenopathy:     Cervical: No cervical adenopathy.  Skin:    General: Skin is warm and dry.     Comments: Bilateral lower extremities discolored Left toes are cyanotic   Neurological:     Mental Status: She is alert.     Comments: Is lethargic today       ASSESSMENT/ PLAN:  TODAY:   1. Hypertension associated with stage 4 chronic kidney disease due to type 2 diabetes mellitus 2. Peripheral vascular disease 3. Bilateral leg edema  Will begin lasix 20 mg daily  Will repeat bmp in one week Will begin ativan 0.5 mg nightly Will change tylenol to 1 gm three times daily   MD is aware of resident's narcotic use and is in agreement with current plan of care. We will attempt to wean resident as apropriate   Ok Edwards NP [facility] Adult Medicine  Contact [phone] Monday through Friday 8am- 5pm  After hours call [phone]

## 2018-09-24 ENCOUNTER — Other Ambulatory Visit: Payer: Self-pay | Admitting: Adult Health

## 2018-09-24 ENCOUNTER — Non-Acute Institutional Stay (SKILLED_NURSING_FACILITY): Payer: Medicare Other | Admitting: Adult Health

## 2018-09-24 ENCOUNTER — Encounter: Payer: Self-pay | Admitting: Adult Health

## 2018-09-24 DIAGNOSIS — G301 Alzheimer's disease with late onset: Secondary | ICD-10-CM | POA: Diagnosis not present

## 2018-09-24 DIAGNOSIS — I96 Gangrene, not elsewhere classified: Secondary | ICD-10-CM | POA: Insufficient documentation

## 2018-09-24 DIAGNOSIS — F0280 Dementia in other diseases classified elsewhere without behavioral disturbance: Secondary | ICD-10-CM | POA: Diagnosis not present

## 2018-09-24 DIAGNOSIS — R627 Adult failure to thrive: Secondary | ICD-10-CM | POA: Insufficient documentation

## 2018-09-24 MED ORDER — MORPHINE SULFATE (CONCENTRATE) 10 MG /0.5 ML PO SOLN: 5.0000 mg | Freq: Four times a day (QID) | ORAL | 0 refills | Status: AC

## 2018-09-24 MED ORDER — LORAZEPAM 0.5 MG PO TABS: 0.5000 mg | ORAL_TABLET | Freq: Two times a day (BID) | ORAL | 0 refills | Status: AC

## 2018-09-24 NOTE — Progress Notes (Signed)
Location:    [place] Room Number: 149/W Place of Service:  SNF (31)   CODE STATUS: DNR  Allergies  Allergen Reactions   Contrast Media [Iodinated Diagnostic Agents] Other (See Comments)    Unknown-listed on MAR-patient is not aware of these allergies   Latex Other (See Comments)    Unknown-listed on MAR-patient is not aware of these allergies    Chief Complaint  Patient presents with   Acute Visit    End of life visit    HPI:  She has been yelling out over the weekend due to pain in her legs. Her lower extremities are cyanotic and cold to touch. She is aware of surroundings; and is restless. There are no reports of fevers present.    Past Medical History:  Diagnosis Date   Acute cystitis    Dementia ([place])    Dermatomycosis, unspecified    DJD (degenerative joint disease)    Spine   DM (diabetes mellitus) ([place])    GERD (gastroesophageal reflux disease)    History of pulmonary embolism    History of supraventricular tachycardia    Hyperlipidemia    Hypertension    Hypothyroidism    Long term (current) use of anticoagulants    coumadin   Obesity    Other malaise and fatigue    PVD (peripheral vascular disease) ([place])     Past Surgical History:  Procedure Laterality Date   ATRIAL ABLATION SURGERY  01/2003   Catheter, to tachic palpitations   ESOPHAGOGASTRODUODENOSCOPY N/A 10/15/2017   Procedure: ESOPHAGOGASTRODUODENOSCOPY (EGD);  Surgeon: Rogene Houston, MD;  [facility];  Service: Endoscopy;  Laterality: N/A;  2:50   MASTECTOMY  2003   Left   THYROIDECTOMY, PARTIAL  1962    Social History   Socioeconomic History   Marital status: Widowed    Spouse name: Not on file   Number of children: Not on file   Years of education: Not on file   Highest education level: Not on file  Occupational History   Occupation: Retired    Fish farm manager: RETIRED  Scientist, product/process development strain: Not hard  at all   Food insecurity    Worry: Never true    Inability: Never true   Transportation needs    Medical: No    Non-medical: No  Tobacco Use   Smoking status: Former Smoker   Smokeless tobacco: Never Used  Substance and Sexual Activity   Alcohol use: No   Drug use: No   Sexual activity: Not on file  Lifestyle   Physical activity    Days per week: 0 days    Minutes per session: 0 min   Stress: Not at all  Relationships   Social connections    Talks on phone: Never    Gets together: More than three times a week    Attends religious service: Never    Active member of club or organization: No    Attends meetings of clubs or organizations: Never    Relationship status: Widowed   Intimate partner violence    Fear of current or ex partner: No    Emotionally abused: No    Physically abused: No    Forced sexual activity: No  Other Topics Concern   Not on file  Social History Narrative   Widowed   One daughter   Family History  Problem Relation Age of Onset   Coronary artery disease Sister    Coronary artery  disease Sister    Coronary artery disease Sister    Heart attack Sister    Alcohol abuse Sister       VITAL SIGNS BP 109/70    Pulse 87    Temp 98.8 F (37.1 C) (Oral)    Resp (!) 22    Ht 5\' 9"  (1.753 m)    Wt 199 lb 6.4 oz (90.4 kg)    SpO2 95%    BMI 29.45 kg/m   Outpatient Encounter Medications as of 09/24/2018  Medication Sig   acetaminophen (TYLENOL) 500 MG tablet Take 1,000 mg by mouth 3 (three) times daily.   Balsam Peru-Castor Oil (VENELEX) OINT Apply 1 application topically See admin instructions. Apply to sacrum and bilateral buttocks each shift and prn for prevention    furosemide (LASIX) 20 MG tablet Take 20 mg by mouth daily.   ipratropium (ATROVENT) 0.02 % nebulizer solution Take 0.5 mg by nebulization every 6 (six) hours as needed for wheezing or shortness of breath.   levothyroxine (SYNTHROID, LEVOTHROID) 50 MCG tablet Take  50 mcg by mouth daily.     LORazepam (ATIVAN) 0.5 MG tablet Take 1 tablet (0.5 mg total) by mouth at bedtime.   Morphine Sulfate (MORPHINE CONCENTRATE) 10 mg / 0.5 ml concentrated solution Take 20 mg by mouth every 4 (four) hours as needed for severe pain.   Multiple Vitamins-Minerals (MULTIVITAMIN WITH MINERALS) tablet Take 1 tablet by mouth daily.   NON FORMULARY Diet Type:  Regular   NON FORMULARY Closed Blisters - apply thin transparentt film (Tegaderm / Opsite) check every day, change every 7 days and as needed   Nutritional Supplements (ENSURE CLEAR) LIQD Take 1 Bottle by mouth 3 (three) times daily with meals.   OXYGEN Inhale 2 L/min into the lungs continuous. Titrate as needed to maintain saturation > 90%   [DISCONTINUED] acetaminophen (TYLENOL) 325 MG tablet Take 650 mg by mouth 2 (two) times daily.   No facility-administered encounter medications on file as of 09/24/2018.      SIGNIFICANT DIAGNOSTIC EXAMS   LABS REVIEWED PREVIOUS:   12-29-17: urine micro-albumin: 19.7 05-18-18: wbc 4.8; hgb 12.6; hct 44.4; mcv 85.4; plt 236; glucose 148; bun 34; creat 1.65; k+ 4.4; na++ 141; ca 8.8; liver normal albumin 3.1 TSH 1.544; hgb a1c 8.2 05-21-18: glucose 168; bun 38; creat 1.74; k+ 4.2; na++ 139; ca 9.1  05-22-18: glucose 148; bun 36; creat 1.69 k+ 4.3; na++ 140 ca 8.6  05-30-18: glucose 140; bun 376 creat 1.68; k+ 4.5; na++ 139; ca 8.5  06-09-18: glucose 123 bun 43; creat 2.16;  k+ 4.2; na++ 139 ca 8.8 06-13-18: glucose 117; bun 45; creat 2.07 k+ 4.1; na++ 137; ca 9.0 06-19-18: glucose 85; bun 53; creat 2.30; k+ 4.4; na++ 138; ca 8.8 07-02-18: glucose 102; bun 85; creat 3.42; k+ 3.6; na++ 140; ca 9.3 07-05-18: wbc 4.9; hgb 11.7; hct 40.4; mcv 80.0; plt 149 glucose 91; bun 90; creat 2.83; k+ 3.9; na++ 9.3  07-09-18: glucose 100; bun 72; creat 2.10; k+ 3.7; na ++ 143; ca 9.0 07-16-18: glucose 89; bun 48; creat 1.84 k+ 3.8; na++ 145; ca 8.8 07-31-18: wbc 3.6; hgb 12.3; hct 44.0; mcv 81.0; plt  134; glucose 105; bun 26; creat 1.54; k+ 3.9; na++ 145; ca 9.0 total bili 1.5; albumin 3.2   NO NEW LABS.    Review of Systems  Unable to perform ROS: Dementia (unable to participate )     Physical Exam Constitutional:  General: She is not in acute distress.    Appearance: She is well-developed. She is not diaphoretic.  Neck:     Musculoskeletal: Neck supple.     Thyroid: No thyromegaly.  Cardiovascular:     Rate and Rhythm: Normal rate and regular rhythm.     Heart sounds: Normal heart sounds.     Comments: Unable to palpate pedal pulses due to edema History of atrial ablation  Pulmonary:     Effort: Pulmonary effort is normal. No respiratory distress.     Breath sounds: Normal breath sounds.  Chest:     Comments: Left mastectomy  Abdominal:     General: Bowel sounds are normal. There is no distension.     Palpations: Abdomen is soft.     Tenderness: There is no abdominal tenderness.  Musculoskeletal:     Right lower leg: Edema present.     Left lower leg: Edema present.     Comments: 4+ bilateral lower extremity edema  Lymphadenopathy:     Cervical: No cervical adenopathy.  Skin:    General: Skin is warm and dry.     Comments: Both lower extremities cyanotic and cold to touch   Neurological:     Comments: Is aware      ASSESSMENT/ PLAN:  TODAY:   1. Failure to thrive in adult 2. Late onset alzheimer's disease without behavioral disturbance 3. Dry gangrene  Will begin roxanol 5 mg every 6 hours and every 2 hours as needed for pain Will change ativan to 0.5 mg twice daily  The focus of her care is comfort Will continue to monitor her status.   MD is aware of resident's narcotic use and is in agreement with current plan of care. We will attempt to wean resident as apropriate   Ok Edwards NP [facility] Adult Medicine  Contact [phone] Monday through Friday 8am- 5pm  After hours call [phone]

## 2018-09-26 DEATH — deceased
